# Patient Record
Sex: Male | Born: 1955 | Race: White | Hispanic: No | Marital: Married | Smoking: Never smoker
Health system: Southern US, Community
[De-identification: ages and names within clinical notes are randomized; demographics above are authoritative.]

## PROBLEM LIST (undated history)

## (undated) DIAGNOSIS — G4733 Obstructive sleep apnea (adult) (pediatric): Secondary | ICD-10-CM

## (undated) DIAGNOSIS — F32A Depression, unspecified: Secondary | ICD-10-CM

## (undated) DIAGNOSIS — F988 Other specified behavioral and emotional disorders with onset usually occurring in childhood and adolescence: Secondary | ICD-10-CM

## (undated) DIAGNOSIS — R600 Localized edema: Secondary | ICD-10-CM

## (undated) DIAGNOSIS — R0602 Shortness of breath: Secondary | ICD-10-CM

## (undated) DIAGNOSIS — M129 Arthropathy, unspecified: Secondary | ICD-10-CM

## (undated) DIAGNOSIS — M1990 Unspecified osteoarthritis, unspecified site: Secondary | ICD-10-CM

## (undated) DIAGNOSIS — R002 Palpitations: Secondary | ICD-10-CM

## (undated) DIAGNOSIS — G4730 Sleep apnea, unspecified: Secondary | ICD-10-CM

## (undated) DIAGNOSIS — E785 Hyperlipidemia, unspecified: Secondary | ICD-10-CM

## (undated) DIAGNOSIS — M109 Gout, unspecified: Secondary | ICD-10-CM

## (undated) DIAGNOSIS — I509 Heart failure, unspecified: Secondary | ICD-10-CM

## (undated) DIAGNOSIS — I10 Essential (primary) hypertension: Secondary | ICD-10-CM

## (undated) DIAGNOSIS — E669 Obesity, unspecified: Secondary | ICD-10-CM

## (undated) DIAGNOSIS — R079 Chest pain, unspecified: Secondary | ICD-10-CM

## (undated) DIAGNOSIS — Z96649 Presence of unspecified artificial hip joint: Secondary | ICD-10-CM

## (undated) DIAGNOSIS — M549 Dorsalgia, unspecified: Secondary | ICD-10-CM

## (undated) DIAGNOSIS — M2240 Chondromalacia patellae, unspecified knee: Secondary | ICD-10-CM

## (undated) DIAGNOSIS — M2550 Pain in unspecified joint: Secondary | ICD-10-CM

## (undated) HISTORY — DX: Chondromalacia patellae, unspecified knee: M22.40

## (undated) HISTORY — DX: Obesity, unspecified: E66.9

## (undated) HISTORY — DX: Sleep apnea, unspecified: G47.30

## (undated) HISTORY — DX: Presence of unspecified artificial hip joint: Z96.649

## (undated) HISTORY — DX: Chest pain, unspecified: R07.9

## (undated) HISTORY — DX: Heart failure, unspecified: I50.9

## (undated) HISTORY — DX: Dorsalgia, unspecified: M54.9

## (undated) HISTORY — DX: Localized edema: R60.0

## (undated) HISTORY — DX: Gout, unspecified: M10.9

## (undated) HISTORY — PX: NASAL SINUS SURGERY: SHX719

## (undated) HISTORY — DX: Depression, unspecified: F32.A

## (undated) HISTORY — DX: Other specified behavioral and emotional disorders with onset usually occurring in childhood and adolescence: F98.8

## (undated) HISTORY — DX: Palpitations: R00.2

## (undated) HISTORY — DX: Shortness of breath: R06.02

## (undated) HISTORY — DX: Obstructive sleep apnea (adult) (pediatric): G47.33

## (undated) HISTORY — PX: JOINT REPLACEMENT: SHX530

## (undated) HISTORY — DX: Arthropathy, unspecified: M12.9

## (undated) HISTORY — DX: Pain in unspecified joint: M25.50

## (undated) HISTORY — PX: COLONOSCOPY: SHX174

## (undated) HISTORY — DX: Hyperlipidemia, unspecified: E78.5

---

## 2001-02-19 ENCOUNTER — Emergency Department (HOSPITAL_COMMUNITY): Admission: EM | Admit: 2001-02-19 | Discharge: 2001-02-19 | Payer: Self-pay | Admitting: Emergency Medicine

## 2003-12-17 ENCOUNTER — Ambulatory Visit (HOSPITAL_COMMUNITY): Admission: RE | Admit: 2003-12-17 | Discharge: 2003-12-17 | Payer: Self-pay | Admitting: Gastroenterology

## 2004-04-14 ENCOUNTER — Ambulatory Visit: Payer: Self-pay | Admitting: Family Medicine

## 2004-04-18 ENCOUNTER — Encounter: Admission: RE | Admit: 2004-04-18 | Discharge: 2004-04-18 | Payer: Self-pay | Admitting: Family Medicine

## 2004-05-10 ENCOUNTER — Ambulatory Visit: Payer: Self-pay | Admitting: Family Medicine

## 2004-12-28 ENCOUNTER — Ambulatory Visit (HOSPITAL_COMMUNITY): Admission: RE | Admit: 2004-12-28 | Discharge: 2004-12-28 | Payer: Self-pay

## 2004-12-28 ENCOUNTER — Ambulatory Visit (HOSPITAL_BASED_OUTPATIENT_CLINIC_OR_DEPARTMENT_OTHER): Admission: RE | Admit: 2004-12-28 | Discharge: 2004-12-28 | Payer: Self-pay

## 2005-06-07 ENCOUNTER — Inpatient Hospital Stay (HOSPITAL_COMMUNITY): Admission: RE | Admit: 2005-06-07 | Discharge: 2005-06-11 | Payer: Self-pay | Admitting: Orthopedic Surgery

## 2005-06-21 ENCOUNTER — Emergency Department (HOSPITAL_COMMUNITY): Admission: EM | Admit: 2005-06-21 | Discharge: 2005-06-22 | Payer: Self-pay | Admitting: Emergency Medicine

## 2005-07-18 ENCOUNTER — Ambulatory Visit: Payer: Self-pay | Admitting: Family Medicine

## 2007-02-25 DIAGNOSIS — M109 Gout, unspecified: Secondary | ICD-10-CM | POA: Insufficient documentation

## 2008-02-03 ENCOUNTER — Ambulatory Visit: Payer: Self-pay | Admitting: Family Medicine

## 2008-02-03 DIAGNOSIS — M2240 Chondromalacia patellae, unspecified knee: Secondary | ICD-10-CM | POA: Insufficient documentation

## 2008-02-03 DIAGNOSIS — M129 Arthropathy, unspecified: Secondary | ICD-10-CM | POA: Insufficient documentation

## 2008-02-27 ENCOUNTER — Telehealth: Payer: Self-pay | Admitting: Family Medicine

## 2008-03-12 ENCOUNTER — Inpatient Hospital Stay (HOSPITAL_COMMUNITY): Admission: RE | Admit: 2008-03-12 | Discharge: 2008-03-15 | Payer: Self-pay | Admitting: Orthopedic Surgery

## 2008-10-21 ENCOUNTER — Telehealth: Payer: Self-pay | Admitting: Family Medicine

## 2008-11-04 ENCOUNTER — Ambulatory Visit: Payer: Self-pay | Admitting: Family Medicine

## 2008-11-04 DIAGNOSIS — Z96649 Presence of unspecified artificial hip joint: Secondary | ICD-10-CM

## 2008-11-04 DIAGNOSIS — E669 Obesity, unspecified: Secondary | ICD-10-CM

## 2008-11-11 LAB — CONVERTED CEMR LAB
ALT: 41 units/L (ref 0–53)
AST: 30 units/L (ref 0–37)
Albumin: 4.2 g/dL (ref 3.5–5.2)
Alkaline Phosphatase: 65 units/L (ref 39–117)
BUN: 16 mg/dL (ref 6–23)
Basophils Absolute: 0 10*3/uL (ref 0.0–0.1)
Basophils Relative: 0.4 % (ref 0.0–3.0)
Bilirubin, Direct: 0 mg/dL (ref 0.0–0.3)
CO2: 28 meq/L (ref 19–32)
Calcium: 9.4 mg/dL (ref 8.4–10.5)
Chloride: 114 meq/L — ABNORMAL HIGH (ref 96–112)
Cholesterol: 223 mg/dL — ABNORMAL HIGH (ref 0–200)
Creatinine, Ser: 0.8 mg/dL (ref 0.4–1.5)
Direct LDL: 169.1 mg/dL
Eosinophils Absolute: 0.2 10*3/uL (ref 0.0–0.7)
Eosinophils Relative: 3.3 % (ref 0.0–5.0)
GFR calc non Af Amer: 108.46 mL/min (ref >60)
Glucose, Bld: 90 mg/dL (ref 70–99)
HCT: 41 % (ref 39.0–52.0)
HDL: 49.2 mg/dL (ref >39.00)
Hemoglobin: 14.3 g/dL (ref 13.0–17.0)
Lymphocytes Relative: 21.4 % (ref 12.0–46.0)
Lymphs Abs: 1.5 10*3/uL (ref 0.7–4.0)
MCHC: 35 g/dL (ref 30.0–36.0)
MCV: 84.4 fL (ref 78.0–100.0)
Monocytes Absolute: 0.5 10*3/uL (ref 0.1–1.0)
Monocytes Relative: 7 % (ref 3.0–12.0)
Neutro Abs: 4.8 10*3/uL (ref 1.4–7.7)
Neutrophils Relative %: 67.9 % (ref 43.0–77.0)
PSA: 1.07 ng/mL (ref 0.10–4.00)
Platelets: 233 10*3/uL (ref 150.0–400.0)
Potassium: 4.2 meq/L (ref 3.5–5.1)
RBC: 4.86 M/uL (ref 4.22–5.81)
RDW: 12.9 % (ref 11.5–14.6)
Sodium: 144 meq/L (ref 135–145)
TSH: 1.32 microintl units/mL (ref 0.35–5.50)
Total Bilirubin: 1.1 mg/dL (ref 0.3–1.2)
Total CHOL/HDL Ratio: 5
Total Protein: 7.7 g/dL (ref 6.0–8.3)
Triglycerides: 66 mg/dL (ref 0.0–149.0)
VLDL: 13.2 mg/dL (ref 0.0–40.0)
Vit D, 25-Hydroxy: 26 ng/mL — ABNORMAL LOW (ref 30–89)
WBC: 7 10*3/uL (ref 4.5–10.5)

## 2009-11-03 ENCOUNTER — Ambulatory Visit: Payer: Self-pay | Admitting: Family Medicine

## 2009-11-03 LAB — CONVERTED CEMR LAB: Uric Acid, Serum: 11.4 mg/dL — ABNORMAL HIGH (ref 4.0–7.8)

## 2009-11-23 ENCOUNTER — Encounter: Payer: Self-pay | Admitting: Family Medicine

## 2010-03-07 ENCOUNTER — Telehealth (INDEPENDENT_AMBULATORY_CARE_PROVIDER_SITE_OTHER): Payer: Self-pay | Admitting: *Deleted

## 2010-07-07 ENCOUNTER — Other Ambulatory Visit: Payer: Self-pay

## 2010-07-07 DIAGNOSIS — F439 Reaction to severe stress, unspecified: Secondary | ICD-10-CM

## 2010-07-07 MED ORDER — LORAZEPAM 2 MG PO TABS: 2.0000 mg | ORAL_TABLET | Freq: Three times a day (TID) | ORAL | Status: AC | PRN

## 2010-07-07 NOTE — Medication Information (Signed)
Summary: Prior Authorization Request for Celebrex  Prior Authorization Request for Celebrex   Imported By: Maryln Gottron 11/25/2009 14:28:01  _____________________________________________________________________  External Attachment:    Type:   Image     Comment:   External Document

## 2010-07-07 NOTE — Progress Notes (Signed)
Summary: Naproxen instead of Celebrex?  Phone Note Outgoing Call   Summary of Call: Tried to call patient to inform him that the Celebrex is being denied. Per MD we can try Naproxen 375 two times a day or do a referral to pain management. Left message for patient to return my call. Initial call taken by: Josph Macho RMA,  March 07, 2010 4:09 PM  Follow-up for Phone Call        Patient informed and states he will think about it and give [place] a call back. Follow-up by: Josph Macho RMA,  March 09, 2010 4:28 PM

## 2010-09-15 ENCOUNTER — Other Ambulatory Visit: Payer: Self-pay

## 2010-09-15 MED ORDER — HYDROCODONE-ACETAMINOPHEN 10-650 MG PO TABS: 1.0000 | ORAL_TABLET | Freq: Four times a day (QID) | ORAL | Status: DC | PRN

## 2010-09-15 NOTE — Telephone Encounter (Signed)
rx called in [facility] to [facility]

## 2010-10-18 NOTE — H&P (Signed)
[name], [name] NO.:  [id number]   MEDICAL RECORD NO.:  [id number]          PATIENT TYPE:  INP   LOCATION:  1616                         FACILITY:  [facility]   PHYSICIAN:  Ollen Gross, M.D.    DATE OF BIRTH:  [birth year]   DATE OF ADMISSION:  03/12/2008  DATE OF DISCHARGE:  03/15/2008                              HISTORY & PHYSICAL   Date of office visit, history, and physical were performed on February 18, 2008.   CHIEF COMPLAINT:  Left hip pain.   HISTORY OF PRESENT ILLNESS:  The patient is a 55 year old male, who has  been seen by Dr. Lequita Halt for ongoing left hip pain.  He is known to Dr.  Lequita Halt and had previously undergone a right total hip back in 2007, and  doing well with that.  Now, the left hip continues to be a problem, and  he presents now for surgery.   ALLERGIES:  No known drug allergies.   CURRENT MEDICATIONS:  Glucosamine, Celebrex, multivitamins, baby  aspirin, and oxycodone.   PAST MEDICAL HISTORY:  Gout.   PAST SURGICAL HISTORY:  1. Ear surgery.  2. Abdominal hernia repair.  3. Right total hip.   SOCIAL HISTORY:  He is single.  Denies the use of tobacco products or  alcohol products.  He is a Sports administrator.   FAMILY HISTORY:  Aunt deceased with leukemia, father deceased with MI  and diabetes.   REVIEW OF SYSTEMS:  GENERAL:  No fevers, chills, or night sweats.  NEURO:  No seizures, syncope, or paralysis.  RESPIRATORY:  No shortness  of breath, productive cough, or hemoptysis.  CARDIOVASCULAR:  No chest  pain or orthopnea.  GI:  No nausea, vomiting, diarrhea, or constipation.  GU:  No dysuria, hematuria, or discharge.  MUSCULOSKELETAL:  Left hip.   PHYSICAL EXAMINATION:  VITAL SIGNS:  Pulse 80, respirations 14, blood  pressure 132/80.  GENERAL:  A 55 year old white male, well-nourished, well-developed,  short in stature, slightly overweight, alert, oriented, and cooperative.  HEENT:  Normocephalic, atraumatic.  Pupils  round and reactive.  EOMs  intact.  NECK:  Supple.  CHEST:  Clear.  HEART:  Regular rate and rhythm without murmur.  ABDOMEN:  Soft, slightly __________, and bowel sounds present.  RECTAL, BREASTS, GENITALIA:  Not done and not pertinent to the present  illness.  EXTREMITIES:  Left hip flexion 90, internal rotation is__________, 0  external rotation, 10 to 15 degrees abduction.   IMPRESSION:  Osteoarthritis, left hip.   PLAN:  The patient admitted to [facility] to undergo a left  total hip replacement and arthroplasty.  The surgery will be performed  by Dr. Ollen Gross.      Alexzandrew L. Perkins, P.A.C.      Ollen Gross, M.D.  Electronically Signed    ALP/MEDQ  D:  03/15/2008  T:  03/15/2008  Job:  [id number]

## 2010-10-18 NOTE — Op Note (Signed)
[name], [name] NO.:  [id number]   MEDICAL RECORD NO.:  [id number]          PATIENT TYPE:  INP   LOCATION:  0004                         FACILITY:  [facility]   PHYSICIAN:  Ollen Gross, M.D.    DATE OF BIRTH:  22-Apr-1956   DATE OF PROCEDURE:  03/12/2008  DATE OF DISCHARGE:                               OPERATIVE REPORT   PREOPERATIVE DIAGNOSIS:  Osteoarthritis left hip.   POSTOPERATIVE DIAGNOSIS:  Osteoarthritis left hip.   PROCEDURE:  Left total hip arthroplasty.   SURGEON:  Ollen Gross, M.D.   ASSISTANT:  Alexzandrew L. Perkins, P.A.C.   ANESTHESIA:  General.   DRAINS:  Hemovac times one.   COMPLICATIONS:  None.   CONDITION:  Stable to recovery room.   BRIEF CLINICAL NOTE:  [name] is a 55 year old male with severe end-stage  arthritis of the left hip with progressively worsening pain and  dysfunction.  He is status post successful right total hip arthroplasty  and presents now for left total hip arthroplasty.   PROCEDURE IN DETAIL:  After the successful administration of general  anesthetic the patient was placed in the right lateral decubitus  position with the left side up and held with the hip positioner.  Left  lower extremity was isolated from his perineum with plastic drapes and  prepped and draped in the usual sterile fashion.  Short posterolateral  incision was made with a 10 blade through subcutaneous tissue to the  level of the fascia lata which was incised in line with a skin incision.  Sciatic nerve was palpated and protected and then short external  rotators isolated off the femur.  Capsulectomy is performed and the hip  is dislocated.  The center of the femoral head is marked and the trial  prosthesis placed such that the center of the trial head corresponds to  the center of his native femoral head.  Osteotomy line was marked on the  femoral neck and osteotomy made with an oscillating saw.  Femoral head  was removed and the femur  retracted anteriorly to gain acetabular  exposure.   The acetabular retractors were placed and the labrum and osteophytes  removed.  Acetabular reaming starts at 47 mm coursing increments of 2-55  mm and a 56 mm Pinnacle acetabular shell was impacted in anatomic  position with outstanding purchase.  We did not use any dome screws.  The apex hole eliminator is placed and then the permanent 40 mm neutral  Ultamet metal liner is placed for metal-on-metal hip replacement.   The femur is prepared with the canal finder and irrigation.  Axial  reaming is performed to 15.5 mm.  Proximal reaming is performed to 31F  and the sleeve machined to a large.  The 31F large trial sleeve is  placed with 20 x 15 stem and a 36 plus 12 neck.  His native anteversion  is neutral so I put him in 20 degrees of anteversion.  The hip was  reduced and there is outstanding stability.  There was full extension  and full external rotation, 70 degrees flexion, 40 degrees  adduction, 90  degrees internal rotation and 90 degrees of flexion and 70 degrees of  internal rotation.  By placing the left leg on top of the right it is  felt that the leg lengths are equal.  The hip was dislocated and all  trials were removed.  The permanent 69F large sleeve is placed with a 20  x 15 stem and a 36 plus 12 neck in 20 degrees of anteversion.  The 40  plus 3 head is placed and the hip was reduced with the same stability  parameters.  Please note that the trial was a 40 plus 3 also and that  had the best stability.  Once the hip was reduced and had the same  stability parameters the wounds was copiously irrigated with saline  solution.  Short rotators reattached to the femur through drill holes.  Fascia lata was closed over Hemovac drain with interrupted #1 Vicryl and  subcu closed with #1 and 2-0 Vicryl and subcuticular with running 4-0  Monocryl.  Drain was hooked to suction.  Incision cleaned and dried and  Steri-Strips and a bulky  sterile dressing applied.  He was then placed  into a knee immobilizer, awakened and transported to recovery in stable  condition.      Ollen Gross, M.D.  Electronically Signed     FA/MEDQ  D:  03/12/2008  T:  03/12/2008  Job:  [id number]

## 2010-10-21 NOTE — H&P (Signed)
[name], Henderson NO.:  [id number]   MEDICAL RECORD NO.:  [id number]          PATIENT TYPE:  INP   LOCATION:  NA                           FACILITY:  [facility]   PHYSICIAN:  [name], M.D.    DATE OF BIRTH:  [birth year]   DATE OF ADMISSION:  06/07/2005  DATE OF DISCHARGE:                                HISTORY & PHYSICAL   DATE OF OFFICE VISIT AND HISTORY AND PHYSICAL:  May 25, 2005.   CHIEF COMPLAINT:  Bilateral hip pain, right greater than left.   HISTORY OF PRESENT ILLNESS:  The patient is a 55 year old male being seen by  Dr. Lequita Halt for longstanding history of right greater than left hip pain.  He denies any specific injury but states he played a lot of soccer  throughout his life.  He played college soccer up at [employer] and was able to get through 4 years of playing.  He has had  progressive hip pain.  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  He is seen in the office for x-rays which show severe end-stage  bone-on-bone changes on the right and near bone-on-bone on the left.  It has  progressed to a point where it is interfering with his life, and he is ready  to have something done about it.  Risks and benefits of the surgical  procedure have been discussed, and the patient is subsequently admitted to  the hospital.   ALLERGIES:  No known drug allergies.   CURRENT MEDICATIONS:  Glucosamine, Celebrex, multivitamins, baby aspirin,  oxycodone.   PAST MEDICAL HISTORY:  Negative.   PAST SURGICAL HISTORY:  Ear surgery, abdominal hernia repair.   SOCIAL HISTORY:  Single, denies use of tobacco products and alcohol  products.  He is the Financial controller.   FAMILY HISTORY:  Aunt deceased with leukemia.  Father deceased with history  of MI and diabetes.   REVIEW OF SYSTEMS:  GENERAL:  No fever, chills, night sweats.   NEUROLOGIC:  No seizures or paralysis.  RESPIRATORY: No shortness of breath, productive  cough, hemoptysis. CARDIOVASCULAR: No chest pain, angina, orthopnea. GI: No  nausea, vomiting, diarrhea, constipation. GU: No dysuria, hematuria,  discharge.  MUSCULOSKELETAL: Hips as in History of Present Illness.   PHYSICAL EXAMINATION:  VITAL SIGNS:  Pulse 72, respirations 12, blood  pressure 120/70.  GENERAL:  A 55 year old white male, well-nourished, well-developed, short  stature, slightly overweight, mildly anxious.  He is alert, oriented,  cooperative. Appears to be a good historian.  HEENT:  Normocephalic and atraumatic.  Pupils equal, round, and reactive to  light.  Oropharynx clear.  EOMs intact.  NECK:  Supple.  CHEST:  Somewhat of a barrel chested individual, but he has clear anterior  and posterior chest wall.  HEART:  Regular rate and rhythm, no murmur.  ABDOMEN:  Soft, round, nontender.  Bowel sounds present.  BREASTS/GU/RECTAL:  No done, not pertinent for present illness.  EXTREMITIES:  Right hip shows flexion of 90, zero internal rotation, zero  external rotation.  He has about a 15 degree external rotation contracture  noted.   IMPRESSION:  1.  End-state arthritis right hip.  2.  Near bone-on-bone arthritis left hip.   PLAN:  The patient is admitted to [facility] to undergo right  total hip arthroplasty.  Surgery will be performed by Dr. Ollen Henderson.      [name], P.A.      [name], M.D.  Electronically Signed    ALP/MEDQ  D:  06/06/2005  T:  06/06/2005  Job:  [id number]

## 2010-10-21 NOTE — Discharge Summary (Signed)
NAMERICH, PAPROCKI NO.:  [id number]   MEDICAL RECORD NO.:  [id number]          PATIENT TYPE:  INP   LOCATION:  1503                         FACILITY:  [facility]   PHYSICIAN:  Ollen Gross, M.D.    DATE OF BIRTH:  09-22-1955   DATE OF ADMISSION:  06/07/2005  DATE OF DISCHARGE:  06/11/2005                                 DISCHARGE SUMMARY   ADMISSION DIAGNOSES:  1.  Osteoarthritis right hip.  2.  Near bone-on-bone arthritis left hip.   DISCHARGE DIAGNOSES:  1.  Osteoarthritis right hip, status post right total hip arthroplasty.  2.  Near bone-on-bone arthritis left hip.  3.  Postoperative blood loss anemia.   PROCEDURE:  On June 07, 2005, right total hip arthroplasty. Surgeon was  Dr. Lequita Halt. Assistant, Alexzandrew L. Perkins, P.A.C.   ANESTHESIA:  General.   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 Now  presents for total hip arthroplasty.   CONSULTATIONS:  None.   LABORATORY DATA:  Preoperative CBC hemoglobin 13.6, hematocrit 41.5, white  cell count 9.2. Hemoglobin dropped to 11.7, last noted at 10.2 with a  hematocrit of 29.6. PT and PTT preoperatively at 13.5 to 34 respectively.  INR 1.0. Serial pro times were followed. PT/INR 16.5 and 1.3. Chem panel on  admission showed an elevated AST of 39, elevated ALT of 71. Remaining chem  panel within normal limits. Serial BMETs were followed. Electrolytes  remained within normal limits along with the renal function. Urinalysis  preoperatively was negative. Blood type was AB positive. EKG dated May 25, 2005 had normal sinus rhythm, left ventricular hypertrophy with QRS  widening, nonspecific T-wave abnormalities when compared to EKG of May 25, 2005. Previous EKG no significant change was seen since the last tracing  confirmed by Dr. Armanda Magic.   HOSPITAL COURSE:  The patient was admitted to  [facility].  Tolerated the procedure well. Later, sent to regular rate and rhythm and  then orthopedic floor. Had a rough night following surgery. Was using the  pain pills with no much relief. Had been on oxycodone for some time  preoperatively. Increases PCA to high dose as this was not done through the  night. He did have a little bit of nausea with change of position, given  antiemetics. Medications were increased. Hemovac drain was pulled without  difficulty. By day two, he was doing a little bit better with his pain  control. He slept in the chair through the night. He started having some  foot pain which was believed to be his gout flare up. Dressing was changed.  Incision was healing well. Did have some elevated temperature and encouraged  incentive spirometer. Started on colchicine for potential gout flare up.  Added Celebrex the next day.   By June 10, 2005, the next day, the temperature was up that night but had  come back down to a normal level. On the morning of day three, the gout  flare up was resolving.  Incision was healing well. From the therapy  standpoint by that time, he was already getting up and ambulating 25 feet  and the later walked 90 feet that day. He had been working with PT and OT  and felt that since he was progressing well he would be ready for discharge  on the following day of June 11, 2004. His gout had improved. He was  progressing with physical therapy. Up ambulating approximately 60 feet.  Tolerating his medications and was discharged home.   DISCHARGE INSTRUCTIONS:  1.  Discharged home on June 11, 2005.  2.  Discharge diagnoses, please see above.  3.  Discharge medications: Percocet, Robaxin, Coumadin.  4.  Diet: As tolerated.  5.  Activity: 25-50% partial weightbearing to right lower extremity. Gait      training, ambulation, ADLs, home health PT and home health nursing      through [facility].  6.  Follow up two weeks from  surgery. Contact the office at [phone].   DISPOSITION:  Home.   CONDITION ON DISCHARGE:  Improving.      Alexzandrew L. Julien Girt, P.A.      Ollen Gross, M.D.  Electronically Signed    ALP/MEDQ  D:  07/12/2005  T:  07/13/2005  Job:  [id number]   cc:   Ollen Gross, M.D.  Fax: [phone]   Patient's chart

## 2010-10-21 NOTE — Discharge Summary (Signed)
NAMEDENORRIS, REUST NO.:  [id number]   MEDICAL RECORD NO.:  [id number]          PATIENT TYPE:  INP   LOCATION:  1616                         FACILITY:  [facility]   PHYSICIAN:  Ollen Gross, M.D.    DATE OF BIRTH:  August 30, 1955   DATE OF ADMISSION:  03/12/2008  DATE OF DISCHARGE:  03/15/2008                               DISCHARGE SUMMARY   ADMITTING DIAGNOSIS:  1. Osteoarthritis left hip.  2. Gout.   DISCHARGE DIAGNOSIS:  1. Osteoarthritis left hip, status post left total replacement      arthroplasty.  2. Gout.   PROCEDURE:  March 12, 2008, left total hip.  Surgeon Dr. Lequita Halt,  assistant Avel Peace, PA-C.   CONSULTS:  None.   BRIEF HISTORY:  Rosalia Hammers is a 55 year old male with severe end-stage  arthritis of left hip, progressively worsening pain and dysfunction.  He  is status post successful right total hip, now presents to have the  other side done.   LABORATORY DATA:  Preop CBC showed hemoglobin 13.8, hematocrit of 40.5,  white cell count 7.8 with platelets of 250.  Postop hemoglobin 11.1  drifts down, last known H&H was 10.5 and 30.8.  PT/PTT preop 13.3 and  28, respectively.  INR 1.0.  Serial pro times followed.  Last known  PT/INR 18.3/1.5.   Chem panel on admission all within normal limits.  Serial B-mets were  followed.  Electrolytes remained within normal limits.  Preop UA:  Trace  hemoglobin, 0-2 red cells, otherwise negative.  Blood group type AB  positive.   Electrocardiogram, March 09, 2008:  Normal sinus rhythm, RSR in QR  pattern in V1 suggests right ventricular conduction delay, left axis  deviation, left anterior fascicular block, left ventricular hypertrophy.  No significant change from last tracing of May 25, 2005.  Confirmed  by Dr. Jacinto Halim.   X-RAYS:  Left hip films, March 09, 2008:  Advanced left hip arthritis.  Portable pelvis and hip film on March 12, 2008:  Left total hip without  complicating features.   HOSPITAL  COURSE:  The patient was admitted to [facility],  tolerated procedure well, later transferred from recovery room to the  orthopedic floor, started PCA empirically for pain control following  surgery.  Given 24 hours postop IV antibiotics.  Started on Coumadin per  protocol.  Patient doing pretty well on the morning of day 1.  Hemovac  drain was pulled.  Started getting up out of bed, partial weightbearing.  By day 2, started to progress with therapy, walking about 140 feet.  Weaned over to oral  meds.  The PCA was discontinued after day 1.  Weaned over to oral meds.  Tolerating therapy well, was walking about  150 feet by day 2, was ready to go home by postop day 3.   DISCHARGE/PLAN:  Patient discharged home on March 15, 2008.   DISCHARGE DIAGNOSES:  Please see above.   DISCHARGE MEDICATIONS:  Percocet, Robaxin, Coumadin, Restoril.   DIET:  As tolerated.   FOLLOWUP:  Two weeks.   ACTIVITY:  Partial weightbearing on the left  lower extremity, 25%-50%.  Total hip protocol.  Hip precautions.  May start showering.  Do not  serve submerge incision under water.   DISPOSITION:  Home.   CONDITION ON DISCHARGE:  Improving.      Alexzandrew L. Perkins, P.A.C.      Ollen Gross, M.D.  Electronically Signed    ALP/MEDQ  D:  04/24/2008  T:  04/24/2008  Job:  [id number]   cc:   Ollen Gross, M.D.  Fax: [phone]

## 2010-10-21 NOTE — Op Note (Signed)
NAME:  [name], [name]                    ACCOUNT NO.:  [id number]   MEDICAL RECORD NO.:  [id number]          PATIENT TYPE:  AMB   LOCATION:  DSC                          FACILITY:  [facility]   PHYSICIAN:  Lorre Munroe., M.D.DATE OF BIRTH:  03-06-1956   DATE OF PROCEDURE:  12/28/2004  DATE OF DISCHARGE:                                 OPERATIVE REPORT   PREOPERATIVE DIAGNOSIS:  Umbilical hernia.   POSTOPERATIVE DIAGNOSES:  1.  Umbilical hernia.  2.  Epigastric hernia   OPERATION:  Repair of umbilical and epigastric hernia.   SURGEON:  Zigmund Daniel, M.D.   ANESTHESIA:  General and local.   PROCEDURE:  After the patient was monitored and anesthetized and had routine  preparation and draping of the abdomen, I made a transverse, downwardly-  curved incision about 4 cm in length at just above the umbilicus.  I  separated the umbilical hernia sac and contents from the umbilical skin and  the surrounding normal tissues.  I dissected it down and cut the attachments  to the fascia and reduced it.  It seemed to be sac containing omentum.  I  found that the defect was about 1.5 cm in diameter.  We then dissected  laterally and superiorly and inferiorly to create laxity so that I could  close the defect and patch it with mesh.  In so doing, I found three defects  containing preperitoneal fat in the area cephalad to the umbilical hernia  defect.  I interpreted these as epigastric hernias.  There were all slightly  off the midline.  I dissected each one up and either removed fat which was protruding through  the fascia or reduced it.  I then closed all hernia defects with 2-0 Prolene  suture.  I dissected some more so that there was a space available to  implant an overlay mesh.  I cut a piece of polypropylene mesh approximately  3 x 4 cm and elliptically shaped and sewed that in over all of the repairs  that I had made with running basting 2-0 Prolene suture.  That appeared to  protect all  of the area of attenuated fascia.  Hemostasis was excellent.  I thoroughly anesthetized the deep tissues and the skin.  I used a long-  acting local anesthetic.  I used 3-0 Vicryl then to sew the umbilical skin  down to the central part of the mesh and sewed down the subcutaneous tissues  laterally to reduce available dead space and prevent seroma.  I closed the  skin with running intracuticular 4-0 Vicryl reinforced by Steri-Strips and  applied a bulky protective bandage.  He tolerated the operation well.       WB/MEDQ  D:  12/28/2004  T:  12/28/2004  Job:  [id number]

## 2010-10-21 NOTE — Op Note (Signed)
[name], [name] NO.:  [id number]   MEDICAL RECORD NO.:  [id number]          PATIENT TYPE:  INP   LOCATION:  0003                         FACILITY:  [facility]   PHYSICIAN:  Ollen Gross, M.D.    DATE OF BIRTH:  30-Oct-1955   DATE OF PROCEDURE:  06/07/2005  DATE OF DISCHARGE:                                 OPERATIVE REPORT   PREOPERATIVE DIAGNOSIS:  Osteoarthritis right hip.   POSTOPERATIVE DIAGNOSIS:  Osteoarthritis right hip.   PROCEDURE:  Right total hip arthroplasty.   SURGEON:  Ollen Gross, M.D.   ASSISTANT:  Avel Peace.   ANESTHESIA:  General.   ESTIMATED BLOOD LOSS:  450.   DRAINS:  Hemovac x1.   COMPLICATIONS:  None.   CONDITION:  Stable to recovery.   BRIEF CLINICAL NOTE:  [name] is a 55 year old male with end-stage arthritis in  both hips right more symptomatic than left. He has had intractable pain and  presents now for a total hip arthroplasty.   PROCEDURE IN DETAIL:  After successful administration of general anesthetic,  the patient's placed in the left lateral decubitus position with the right  side up and held with a hip positioner. The right lower extremity was  isolated from his perineum with plastic drapes and prepped and draped in the  usual sterile fashion. A short posterolateral incision was made with a 10  blade through the subcutaneous tissue to the level of the fascia lata which  was incised in line with the skin incision. The sciatic nerve was palpated  and protected and the short rotators isolated off the femur. Capsulectomy  was performed and the hip is dislocated. A trial prosthesis is placed such  that the center of the trial head corresponds to the center of his native  femoral head. Osteotomy line is marked on the femoral neck and osteotomy  made with an oscillating saw. The femoral head is removed and then femur  retracted anteriorly to gain acetabular exposure.   The acetabular reaming is initiated at 47  coursing in increments of 2 up to  55 mm and a 56 mm pinnacle acetabular shell is placed in anatomic position  with excellent fit and then is transfixed with two dome screws with  excellent purchase. A trial 36 mm neutral liner was placed. I then removed  the osteophytes around the rim of the shell.   The femur is repaired with the canal finder and then irrigation. Axial  reaming is performed 15.5 mm, proximal reaming to a 67F and the sleeve  machined to a large. A 67F large trial sleeve is placed with a 20 x 15 stem  and a 36 plus 8 neck. With the 36 plus 8 neck, the offset was not restored  as well as I would have liked, so we went to a 36 plus 12 neck. I put in  about 20 degrees of anteversion which was slightly beyond his native  anteversion. With a 36 plus 0 head, there was still a tiny bit of laxity so  I went to a 36 plus 6. This had excellent  soft tissue tension. There was  great stability with full extension, full external rotation, 70 degrees  flexion, 40 degrees adduction and 90 degrees of internal rotation and 90  degrees of flexion, 70 degrees internal rotation. By placing the right leg  on top of the left, it felt as though the leg lengths were equal. The hip is  then dislocated and all trials are removed. The permanent apex hole  eliminator is placed into the acetabular shell and then the permanent 36 mm  neutral Ultamet metal liner was placed. This is a metal-on-metal hip  replacement. The permanent 22F large sleeve is placed with the 20 x 15 stem,  36 plus 12 neck and again about 10 degrees beyond his native anteversion. A  36 plus 6 head is placed and the hip is reduced with the same stability  parameters. The wound was copiously irrigated with saline solution and the  short rotators reattached to the femur through drill holes. The fascia lata  was closed over a Hemovac drain with interrupted #1 Vicryl, subcu closed  with #1 and #2-0 Vicryl and subcuticular with running  4-0 Monocryl. The  incision is clean and dried and Steri-Strips and bulky sterile dressing  applied. He was then awakened and transported to recovery in stable  condition.      Ollen Gross, M.D.  Electronically Signed     FA/MEDQ  D:  06/07/2005  T:  06/07/2005  Job:  [id number]

## 2010-11-12 ENCOUNTER — Other Ambulatory Visit: Payer: Self-pay | Admitting: Family Medicine

## 2010-11-13 NOTE — Telephone Encounter (Signed)
Prescriptions refilled patient come in for physical

## 2011-01-31 ENCOUNTER — Other Ambulatory Visit: Payer: Self-pay

## 2011-01-31 MED ORDER — LORAZEPAM 2 MG PO TABS: ORAL_TABLET | ORAL | Status: DC

## 2011-01-31 NOTE — Telephone Encounter (Signed)
Ok per Dr. Scotty Court to call in lorazepam 2 mg 90 x 5rf.

## 2011-03-06 LAB — COMPREHENSIVE METABOLIC PANEL
ALT: 42
AST: 30
Albumin: 4
Alkaline Phosphatase: 71
BUN: 15
CO2: 27
Calcium: 9.8
Chloride: 106
Creatinine, Ser: 0.94
GFR calc Af Amer: >60
GFR calc non Af Amer: >60
Glucose, Bld: 96
Potassium: 4.1
Sodium: 140
Total Bilirubin: 0.8
Total Protein: 7

## 2011-03-06 LAB — URINALYSIS, ROUTINE W REFLEX MICROSCOPIC
Bilirubin Urine: NEGATIVE
Glucose, UA: NEGATIVE
Ketones, ur: NEGATIVE
Leukocytes, UA: NEGATIVE
Nitrite: NEGATIVE
Protein, ur: NEGATIVE
Specific Gravity, Urine: 1.018
Urobilinogen, UA: 0.2
pH: 6

## 2011-03-06 LAB — CBC
HCT: 32.6 — ABNORMAL LOW
HCT: 40.5
Hemoglobin: 10.5 — ABNORMAL LOW
Hemoglobin: 13.8
MCHC: 34.1
MCHC: 34.2
MCV: 87.1
Platelets: 186
Platelets: 192
Platelets: 250
RBC: 3.72 — ABNORMAL LOW
RBC: 4.65
RDW: 13.2
RDW: 13.2
RDW: 13.5
RDW: 13.5
WBC: 7.8
WBC: 8

## 2011-03-06 LAB — BASIC METABOLIC PANEL
BUN: 9
CO2: 29
CO2: 30
Calcium: 8.5
Calcium: 8.5
Chloride: 101
Creatinine, Ser: 0.74
Creatinine, Ser: 0.75
GFR calc Af Amer: >60
GFR calc Af Amer: >60
GFR calc non Af Amer: >60
GFR calc non Af Amer: >60
Glucose, Bld: 104 — ABNORMAL HIGH
Potassium: 4.1
Sodium: 137

## 2011-03-06 LAB — PROTIME-INR
INR: 1
INR: 1.4
INR: 1.5
Prothrombin Time: 13.3
Prothrombin Time: 15.6 — ABNORMAL HIGH
Prothrombin Time: 17.3 — ABNORMAL HIGH

## 2011-03-06 LAB — URINE MICROSCOPIC-ADD ON

## 2011-03-06 LAB — TYPE AND SCREEN
ABO/RH(D): AB POS
Antibody Screen: NEGATIVE

## 2011-03-06 LAB — APTT: aPTT: 28

## 2011-04-10 ENCOUNTER — Other Ambulatory Visit: Payer: Self-pay

## 2011-04-10 NOTE — Telephone Encounter (Signed)
Please call patient he was given a prescription for hundred tabs and 5 refills.  In April were concerned that he is requiring so much pain medication.  Please call and find out exactly what the problem is and do we need to have him seen by a specialist for further evaluation and find out what the problem is

## 2011-04-10 NOTE — Telephone Encounter (Signed)
rx request for hydrocodone acetamophen  10-650; pt last seen 11/03/09. Pls advise

## 2011-04-12 NOTE — Telephone Encounter (Signed)
Called and spoke with pt and he stated he had to hip replacements and at times he is in a lot of pain at times. Pt states he takes hydrocodone for gout as well. Pls advise.

## 2011-04-13 MED ORDER — HYDROCODONE-ACETAMINOPHEN 10-650 MG PO TABS: 1.0000 | ORAL_TABLET | Freq: Three times a day (TID) | ORAL | Status: DC | PRN

## 2011-04-13 NOTE — Telephone Encounter (Signed)
Vicodin ES number [phone], directions one p.o. T.i.d. P.r.n. Severe pain, no refills.  Instructed him to call his orthopedist, for an appointment to get evaluated to find out why he is having chronic pain.  If, indeed it is chronic pain that the orthopedist cannot solve, then he needs to be seen at the pain clinic

## 2011-04-13 NOTE — Telephone Encounter (Signed)
Pt is aware and rx has been called in to pharmacy.

## 2011-09-19 ENCOUNTER — Other Ambulatory Visit: Payer: Self-pay

## 2011-09-28 ENCOUNTER — Encounter: Payer: Self-pay | Admitting: Family Medicine

## 2011-10-25 ENCOUNTER — Encounter: Payer: Self-pay | Admitting: Family Medicine

## 2012-01-30 ENCOUNTER — Ambulatory Visit: Payer: Self-pay | Admitting: Cardiology

## 2012-01-31 ENCOUNTER — Other Ambulatory Visit: Payer: Self-pay | Admitting: Family Medicine

## 2012-02-07 ENCOUNTER — Encounter: Payer: Self-pay | Admitting: Cardiovascular Disease

## 2012-02-07 ENCOUNTER — Encounter: Payer: Self-pay | Admitting: *Deleted

## 2012-02-08 ENCOUNTER — Institutional Professional Consult (permissible substitution): Payer: Self-pay | Admitting: Cardiovascular Disease

## 2012-03-01 ENCOUNTER — Telehealth: Payer: Self-pay | Admitting: *Deleted

## 2012-03-01 NOTE — Telephone Encounter (Signed)
LEFT MESSAGE WITH BROTHER FOR PT TO CALL BACK PER DR NISHAN NEEDS  [facility] APPT  MAY DOUBLE  BOOK IF NEEDED  C/O CHEST PAIN ?? PT OUT OF TOWN TODAY ./CY

## 2012-03-04 NOTE — Telephone Encounter (Signed)
PT TO CALL BACK TOM TO SCHEDULE  APPT  WITH DR NISHAN./CY

## 2012-06-01 ENCOUNTER — Other Ambulatory Visit: Payer: Self-pay | Admitting: Family Medicine

## 2012-08-06 ENCOUNTER — Other Ambulatory Visit: Payer: Self-pay | Admitting: Internal Medicine

## 2012-08-06 ENCOUNTER — Other Ambulatory Visit: Payer: Self-pay | Admitting: Family Medicine

## 2012-08-06 ENCOUNTER — Ambulatory Visit
Admission: RE | Admit: 2012-08-06 | Discharge: 2012-08-06 | Disposition: A | Discharge status: Home / Self Care | Payer: BC Managed Care – PPO | Source: Ambulatory Visit | Attending: Internal Medicine | Admitting: Internal Medicine

## 2012-08-06 DIAGNOSIS — R0989 Other specified symptoms and signs involving the circulatory and respiratory systems: Secondary | ICD-10-CM

## 2012-08-27 ENCOUNTER — Encounter: Payer: Self-pay | Admitting: *Deleted

## 2012-08-28 ENCOUNTER — Telehealth: Payer: Self-pay | Admitting: *Deleted

## 2012-08-28 ENCOUNTER — Encounter: Payer: Self-pay | Admitting: *Deleted

## 2012-08-28 NOTE — Progress Notes (Signed)
This encounter was created in error - please disregard. This encounter was created in error - please disregard. This encounter was created in error - please disregard. 

## 2012-08-28 NOTE — Telephone Encounter (Signed)
Per verbal from Darrol Angel, NP:  Patient needs to go back to see PCP for this medication. -sh  LM on pt mobile voicemail with these instructions, let him know Dr. Vickey Huger will be available after April 1st for follow up in clinic if needed. -sh

## 2012-08-28 NOTE — Telephone Encounter (Addendum)
error 

## 2012-08-28 NOTE — Telephone Encounter (Signed)
Pt came with smart card from CPAP machine to download.  Pt currently desensitizing with lab owned auto CPAP set between 6 cm and 11 cm with EPR of 2.  Download shows improving usage though average usage around 2 hrs, discussed goal of 4 hrs or more.  Pt reports difficulty with sinus issues which impedes his ability to continue CPAP use beyond a couple of hours.  He notes when he lies down it starts with "lots of liquid in my head", sounds like sinus drainage and then some congestion which makes it difficult for him to use the nasal mask which he likes and is doing well with.  He reports he reaches a point he has to take it off because of these issues.  Gave him a [employer] Full Face Mask to keep today and urged him to try to use this once he got to that point hoping that perhaps this would allow him to benefit from therapy for a longer period of time.  He says he will try it, he doesn't want to have to use it and I don't want that for him either, but it might get him through these issues.  He asks about Steroid nasal spray such as Fluticasone to use at bedtime.  He is using saline nasal spray recommended by PCP.  Pt states PCP wants him to try the saline before trying a prescription but pt feels it isn't helping enough and he wants to sleep better.  We discussed the potential benefit of a referral to an ENT provider to evaluate sinus congestion and drainage issue that is plaguing him at night.  He will consider this but would like to try a steroid nasal spray first.  I will forward request to Darrol Angel, NP as Dr. Vickey Huger is out of town until 09/03/2012. -S. Alanny Rivers, RPSGT

## 2012-08-28 NOTE — Progress Notes (Signed)
Please have patients PCP take care of this

## 2012-09-09 ENCOUNTER — Telehealth: Payer: Self-pay | Admitting: Neurology

## 2012-09-09 DIAGNOSIS — J309 Allergic rhinitis, unspecified: Secondary | ICD-10-CM

## 2012-09-09 DIAGNOSIS — G4733 Obstructive sleep apnea (adult) (pediatric): Secondary | ICD-10-CM

## 2012-09-09 MED ORDER — TRIAMCINOLONE ACETONIDE(NASAL) 55 MCG/ACT NA INHA: 2.0000 | Freq: Every day | NASAL | Status: DC

## 2012-09-09 NOTE — Telephone Encounter (Signed)
Saw [name] at lunch, he is wondering about a trial of nasal spray steroid. He is experiencing a lot of drainage and congestion when he lies down to fall asleep with CPAP. He called while you were out of town, referred pt to his PCP at that time. He explains PCP wants him to use saline but pt feels like its not enough. Told pt he may want to consider consulting with an ENT because it sounds like the problem is severe. He asked if you might let him try the steroid nasal spray and I told him I would ask you and get back to him. -sh      Dear Dirk Dress , I will fill nasocort for him . Prescription attached.

## 2012-09-20 ENCOUNTER — Telehealth: Payer: Self-pay | Admitting: *Deleted

## 2012-09-20 NOTE — Telephone Encounter (Signed)
Spoke to [employer], He just got his nasal spray yesterday.  He also visited his PCP recently and asked to be referred to ENT - his sinus issue and drainage has been going on for about a year and it is impeding his success with CPAP.  He is using the CPAP but can't do so for very long.  He will start using again for 14 days and then return the card for download.  He is very interested in getting his own machine.  I explained we need to help him be able to use it at least 4 hrs a night and 6 nights per week first so he can keep it beyond 90 days.  He understands.

## 2012-09-20 NOTE — Telephone Encounter (Signed)
Message copied by Daryll Drown on Fri Sep 20, 2012  4:35 PM ------      Message from: [facility], [place]      Created: Wed Sep 11, 2012  4:47 PM       Wrote for nasal spray.  2 days ago-       ----- Message -----         From: Bonita Quin, RPSGT         Sent: 09/09/2012   7:33 AM           To: Melvyn Novas, MD            Saw Ray Hally at lunch, he is wondering about a trial of nasal spray steroid.  He is experiencing a lot of drainage and congestion when he lies down to fall asleep with CPAP.  He called while you were out of town, referred pt to his PCP at that time.  He explains PCP wants him to use saline but pt feels like its not enough.  Told pt he may want to consider consulting with an ENT because it sounds like the problem is severe.  He asked if you might let him try the steroid nasal spray and I told him I would ask you and get back to him. -sh       ------

## 2013-04-25 ENCOUNTER — Telehealth: Payer: Self-pay | Admitting: Neurology

## 2013-05-08 ENCOUNTER — Ambulatory Visit (INDEPENDENT_AMBULATORY_CARE_PROVIDER_SITE_OTHER): Payer: BC Managed Care – PPO | Admitting: Neurology

## 2013-05-08 ENCOUNTER — Encounter: Payer: Self-pay | Admitting: Neurology

## 2013-05-08 ENCOUNTER — Encounter (INDEPENDENT_AMBULATORY_CARE_PROVIDER_SITE_OTHER): Payer: Self-pay

## 2013-05-08 VITALS — BP 160/94 | HR 80 | Resp 16 | Ht 66.0 in | Wt 253.0 lb

## 2013-05-08 DIAGNOSIS — G4733 Obstructive sleep apnea (adult) (pediatric): Secondary | ICD-10-CM | POA: Insufficient documentation

## 2013-05-08 NOTE — Patient Instructions (Signed)
CPAP and BIPAP Information CPAP and BIPAP are methods of helping you breathe with the use of air pressure. CPAP stands for "continuous positive airway pressure." BIPAP stands for "bi-level positive airway pressure." In both methods, air is blown into your air passages to help keep you breathing well. With CPAP, the amount of pressure stays the same while you breathe in and out. CPAP is most commonly used for obstructive sleep apnea. For obstructive sleep apnea, CPAP works by holding your airways open so that they do not collapse when your muscles relax during sleep. BIPAP is similar to CPAP except the amount of pressure is increased when you inhale. This helps you take larger breaths. Your health care provider will recommend whether CPAP or BIPAP would be more helpful for you.  WHY ARE CPAP AND BIPAP TREATMENTS USED? CPAP or BIPAP can be helpful if you have:   Sleep apnea.   Chronic obstructive pulmonary disease (COPD).   Diseases that weaken the muscles of the chest, including muscular dystrophy or neurological diseases such as amyotrophic lateral sclerosis (ALS).   Other problems that cause breathing to be weak, abnormal, or difficult.  HOW IS CPAP OR BIPAP ADMINISTERED? Both CPAP and BIPAP are provided by a small machine with a flexible plastic tube that attaches to a plastic mask. The mask fits on your face, and air is blown into your air passages through your nose or mouth. The amount of pressure that is used to blow the air into your air passages can be set on the machine. Your health care provider will determine the pressure setting that should be used based on your individual needs.  WHEN SHOULD CPAP OR BIPAP BE USED? In most cases, the mask is worn only when sleeping. Generally, you will need to wear the mask throughout the night and during the daytime if you take a nap. In a few cases involving certain medical conditions, people also need to wear the mask at other times when they are  awake. Follow your health care provider's instructions for when to use the machine.  USING THE MASK  Because the mask needs to be snug, some people feel a trapped or closed-in feeling (claustrophobic) when first using the mask. You may need to get used to the mask gradually. To do this, you can first hold the mask loosely over your nose or mouth. Gradually apply the mask more snugly. You can also gradually increase the amount of time that you use the mask.   Masks are available in various types and sizes. Some fit over your mouth and nose, and some fit over just your nose. If your mask does not fit well, talk to your health care provider about getting a different one.  If you are using a nasal mask and you tend to breathe through your mouth, a chin strap may be applied to help keep your mouth closed.   The CPAP and BIPAP machines have alarms that may sound if the mask comes off or develops a leak.   If you have trouble with the mask, it is very important that you talk to your health care provider about finding a way to make the mask easier to tolerate. Do not stop using the mask. This could have a negative impact on your health. TIPS FOR USING THE MACHINE  Place your CPAP or BIPAP machine on a secure table or stand near an electrical outlet.   Know where the on-off switch is located on the machine.     Follow your health care provider's instructions for how to set the pressure on your machine and when you should use it.   Do not eat or drink while the CPAP or BIPAP machine is on. Food or fluids could get pushed into your lungs by the pressure of the CPAP or BIPAP.  Do not smoke. Tobacco smoke residue can damage the machine.   For home use, CPAP and BIPAP machines can be rented or purchased through home health care companies. Many different brands of machines are available. Renting a machine before purchasing may help you find out which particular machine works well for you. SEEK  IMMEDIATE MEDICAL CARE IF:  You have redness or open areas around your nose or mouth where the mask fits.   You have trouble operating the CPAP or BIPAP machine.   You cannot tolerate wearing the CPAP or BIPAP mask.  Document Released: 02/18/2004 Document Revised: 01/22/2013 Document Reviewed: 12/19/2012 [facility] Patient Information 2014 [place], [place]. Sleep Apnea Sleep apnea is disorder that affects a person's sleep. A person with sleep apnea has abnormal pauses in their breathing when they sleep. It is hard for them to get a good sleep. This makes a person tired during the day. It also can lead to other physical problems. There are three types of sleep apnea. One type is when breathing stops for a short time because your airway is blocked (obstructive sleep apnea). Another type is when the brain sometimes fails to give the normal signal to breathe to the muscles that control your breathing (central sleep apnea). The third type is a combination of the other two types. HOME CARE  Do not sleep on your back. Try to sleep on your side.  Take all medicine as told by your doctor.  Avoid alcohol, calming medicines (sedatives), and depressant drugs.  Try to lose weight if you are overweight. Talk to your doctor about a healthy weight goal. Your doctor may have you use a device that helps to open your airway. It can help you get the air that you need. It is called a positive airway pressure (PAP) device. There are three types of PAP devices:  Continuous positive airway pressure (CPAP) device.  Nasal expiratory positive airway pressure (EPAP) device.  Bilevel positive airway pressure (BPAP) device. MAKE SURE YOU:  Understand these instructions.  Will watch your condition.  Will get help right away if you are not doing well or get worse. Document Released: 02/29/2008 Document Revised: 05/08/2012 Document Reviewed: 09/23/2011 [facility] Patient Information 2014 [place], [place].

## 2013-05-08 NOTE — Progress Notes (Signed)
[facility]  Provider:  Melvyn Novas, M D  Referring Provider: No ref. provider found Primary Care Physician:  Dr.  Vianne Bulls.  Chief Complaint  Patient presents with  . Breathing Issues    Deviated Septum    HPI:  [name] is a 57 y.o. male  Is seen here as a  revisit  from Dr. Ezzard Standing after a recent nasal -septal surgery.  [name]  is a right-handed, 57 year old gentleman of [name] descent, who originally presented to [facility] with a complaint of progressively louder snoring that had failed to be controlled by a mouth guard appliance .  He had this device made for him by his dentist. The patient attributed his progressive snoring over the last 3 years to having been physically less active. He gained weight especially after 2 hip surgeries were performed. 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  He had 2 hip replacements  and he gained weight,  begun to snore and felt increasingly un-restored in the morning. He awoke with a dry mouth, phlegm obstructed.  his girlfriend  has witnessed apneas as well,  but he had only reluctantly reported those . He also endorsed nocturia 2-3 times at night and waking up every 2 hours.  He reduced his Diet Coke intake, and still woke up every 2 hours,  tried to compensate his EDS  by taking daytime naps of up to 30 minutes.  His particular work environment  (the patient works as a Copywriter, advertising) makes regular sleep habits difficult , he goes  the bed after the kitchen closes , after  Midnight- stays  in bed until about 7:30 AM,  but he is off already awake by 6 AM,   allowing for an overall nocturnal sleep time an average of 5-6 hours.  He is also noted headaches in the morning but these get better as the day goes by. He has not been awoken by severe headaches however. The above history let to a polysomnography evaluation on 07-30-2012.   At the time the patient and [name] Epworth score at 16 points  and the backs inventory at 12 points is a BMI of 39.1 and a neck circumference of 18.5 inches. Meanwhile the also learned that he had a left bundle branch block. The study revealed an AHI of 88.8 but was not REM accentuated and supine versus nonsupine sleep position seemed not to play a role. The patient suffered severe hypoxemia at night- his desaturation reached a nadir of 63% with overall time of 140s 6.8 minutes at or below 89% oxygen saturation. Periodic limb movements were not noted,  neither was a tachybradycardia arrhythmias seen.  The severity of the complex and severe sleep apnea qualified for a split night protocol.  The patient found himself unable to tolerate CPAP mask , yet any nasal airflow at all.  For this reason he was referred for an ENT evaluation. He was told by his ENT physician that he likely only gained about 20% more patent after nasal septal surgery however he feels already that it has improved his overall breathing and he feels now recovered enough to make an attempt to use CPAP again.   Review of Systems: Out of a complete 14 system review, the patient complains of only the following symptoms, and all other reviewed systems are negative.  The patient endorsed in the past and at the sleepiness score of 18 points, today at 11 point he stated that he  had been several days off after the surgery. He still has nocturia, dry mouth in the morning and neck / hypnic headaches.  History   Social History  . Marital Status: Single    Spouse Name: N/A    Number of Children: N/A  . Years of Education: N/A   Occupational History  . Not on file.   Social History Main Topics  . Smoking status: Never Smoker   . Smokeless tobacco: Not on file  . Alcohol Use: No  . Drug Use: Not on file  . Sexual Activity: Not on file   Other Topics Concern  . Not on file   Social History Narrative  . No narrative on file    Family History  Problem Relation Age of Onset  . Ataxia Neg Hx    . Chorea Neg Hx   . Dementia Neg Hx   . Mental retardation Neg Hx   . Migraines Neg Hx   . Multiple sclerosis Neg Hx   . Neurofibromatosis Neg Hx   . Neuropathy Neg Hx   . Parkinsonism Neg Hx   . Seizures Neg Hx   . Stroke Neg Hx     Past Medical History  Diagnosis Date  . GOUT   . EXOGENOUS OBESITY   . ARTHRITIS   . CHONDROMALACIA PATELLA, LEFT   . HIP REPLACEMENT, BILATERAL, HX OF   . SOB (shortness of breath)   . Obstructive apnea     patient could not tolerate CPAP due to nasal airflow restriction.     Past Surgical History  Procedure Laterality Date  . Nasal sinus surgery      Dr . Haroldine Laws , October 2014 , nasal septum     Current Outpatient Prescriptions  Medication Sig Dispense Refill  . allopurinol (ZYLOPRIM) 300 MG tablet TAKE 1 TABLET BY MOUTH EVERY MORNING TO PREVENT GOUT  30 tablet  11  . HYDROcodone-acetaminophen (LORCET) 10-650 MG per tablet Take 1 tablet by mouth every 8 (eight) hours as needed. Maximum 4 tabs in 24 hours  50 tablet  0  . indomethacin (INDOCIN) 25 MG capsule TAKE 1 BY MOUTH THREE TIMES A DAY  90 capsule  11  . LORazepam (ATIVAN) 2 MG tablet Take 1 tablet by mouth 3 times daily as needed for stress or at bedtime.  90 tablet  5  . triamcinolone (NASACORT AQ) 55 MCG/ACT nasal inhaler Place 2 sprays into the nose daily.  1 Inhaler  12   No current facility-administered medications for this visit.    Allergies as of 05/08/2013  . (No Known Allergies)    Vitals: BP 160/94  Pulse 80  Resp 16  Ht 5\' 6"  (1.676 m)  Wt 253 lb (114.76 kg)  BMI 40.85 kg/m2 Last Weight:  Wt Readings from Last 1 Encounters:  05/08/13 253 lb (114.76 kg)   Last Height:   Ht Readings from Last 1 Encounters:  05/08/13 5\' 6"  (1.676 m)    Physical exam:  General: The patient is awake, alert and appears not in acute distress. The patient is well groomed. Head: Normocephalic, atraumatic. Neck is supple. Mallampati 4, neck circumference: 18.25 inches ,   Cardiovascular:  Regular rate and rhythm , without  murmurs or carotid bruit, and without distended neck veins. No TMJ clicking , nasal passage on the left open, and still restricted on the right.  Respiratory: Lungs are clear to auscultation. Skin:  Without evidence of edema, or rash Trunk: BMI is elevated, the patient lost  20 pounds.-and patient  has normal posture.  Neurologic exam : The patient is awake and alert, oriented to place and time.  Memory subjective  described as intact.  There is a normal attention span & concentration ability. Speech is fluent without  dysarthria, dysphonia or aphasia.  Mood and affect are appropriate.  Cranial nerves: Pupils are equal and briskly reactive to light. Funduscopic exam without  evidence of pallor or edema.  Extraocular movements  in vertical and horizontal planes intact and without nystagmus. Visual fields by finger perimetry are intact. Hearing to finger rub intact- bone conduction stronger left than right.  Facial sensation intact to fine touch. Facial motor strength is symmetric and tongue and uvula move midline.  Motor exam:    Normal tone and normal muscle bulk and symmetric normal strength in all extremities.  Sensory:  Fine touch, pinprick and vibration were tested in all extremities.  Proprioception is tested in the upper extremities only. This was  normal.  Coordination: Rapid alternating movements in the fingers/hands is tested and without evidence of ataxia, dysmetria or tremor.  Gait and station: Patient walks without assistive device - Strength within normal limits. Stance is stable and normal.  Deep tendon reflexes: in the  upper and lower extremities are symmetric and intact. Babinski maneuver downgoing.     Assessment/ Plan   After physical and neurologic examination, review of laboratory studies, imaging, neurophysiology testing and pre-existing records, assessment is   1) severe OSA , with resulting high EDS and severe   fatigue, collaborated by his girlfriend's account of witnessed snoring .  positive PSG for mixed apneas at AHI 88. See above  2) obesity , patient was able to lose some weight. Continue low carb diet . He drinks green tea now , not longer soda. Fish and chicken  - diet restricted by gout.  3) nasal septal deviation, now partially corrected and healed since surgery.   Saline nose spray and steroids can be used for the initial autotitration period.  4)Mallompotti still 4. 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  6) HTN -  Nocturia and EDS, fatigue. hopefully responding to CPAP. comorbidities discussed, and risk factors for OSA and OSA as a risk factor.    Plan:  Treatment plan and additional workup :  Autotitration with a low pressure and long Ramp time. Humidified air, provide 2 masks for choice. Referral to [facility].

## 2013-05-27 ENCOUNTER — Telehealth: Payer: Self-pay | Admitting: Neurology

## 2013-05-27 NOTE — Telephone Encounter (Signed)
Left message for patient to call and reschedule 08/14/13 appointment per Dr. Oliva Bustard schedule.

## 2013-05-28 ENCOUNTER — Encounter: Payer: Self-pay | Admitting: Neurology

## 2013-06-02 ENCOUNTER — Encounter: Payer: Self-pay | Admitting: Neurology

## 2013-06-20 NOTE — Telephone Encounter (Signed)
ERROR

## 2013-08-14 ENCOUNTER — Ambulatory Visit: Payer: BC Managed Care – PPO | Admitting: Neurology

## 2013-09-25 ENCOUNTER — Encounter: Payer: Self-pay | Admitting: Cardiovascular Disease

## 2013-09-25 ENCOUNTER — Ambulatory Visit (INDEPENDENT_AMBULATORY_CARE_PROVIDER_SITE_OTHER): Payer: 59 | Admitting: Cardiovascular Disease

## 2013-09-25 ENCOUNTER — Telehealth: Payer: Self-pay | Admitting: *Deleted

## 2013-09-25 VITALS — BP 140/88 | HR 72 | Ht 66.0 in | Wt 250.0 lb

## 2013-09-25 DIAGNOSIS — R0609 Other forms of dyspnea: Secondary | ICD-10-CM

## 2013-09-25 DIAGNOSIS — I10 Essential (primary) hypertension: Secondary | ICD-10-CM | POA: Insufficient documentation

## 2013-09-25 DIAGNOSIS — G4733 Obstructive sleep apnea (adult) (pediatric): Secondary | ICD-10-CM

## 2013-09-25 DIAGNOSIS — E669 Obesity, unspecified: Secondary | ICD-10-CM

## 2013-09-25 DIAGNOSIS — I447 Left bundle-branch block, unspecified: Secondary | ICD-10-CM | POA: Insufficient documentation

## 2013-09-25 DIAGNOSIS — R0989 Other specified symptoms and signs involving the circulatory and respiratory systems: Secondary | ICD-10-CM

## 2013-09-25 MED ORDER — ROSUVASTATIN CALCIUM 20 MG PO TABS: 20.0000 mg | ORAL_TABLET | Freq: Every day | ORAL | Status: DC

## 2013-09-25 NOTE — Progress Notes (Signed)
Patient ID: [name], male   DOB: 07/05/1955, 58 y.o.   MRN: [id number]  58 yo referred for dyspnea LBBB and HTN.  Has been followed by Dr [name] as recently as a year ago  Runs [employer].  Significantly overweight.  Has sleep apnea but not compliant with CPAP.  Complains of dyspnea and fatigue with exertion and sex.  Exercise limited by ortho issues especially left hip replacement.  He indicates normal chemical myovue and echo a year ago with Dr [name].  Compliant with meds.  Poor diet No chest pain Mild reactive airway disease and chronic congestion in throat.  LBBB thought to be due to HTN No syncope or high grade AV block.      ROS: Denies fever, malais, weight loss, blurry vision, decreased visual acuity, cough, sputum, SOB, hemoptysis, pleuritic pain, palpitaitons, heartburn, abdominal pain, melena, lower extremity edema, claudication, or rash.  All other systems reviewed and negative   General: Affect appropriate Obese [name] male  HEENT: normal Neck supple with no adenopathy JVP normal no bruits no thyromegaly Lungs clear with no wheezing and good diaphragmatic motion Heart:  S1/S2 no murmur,rub, gallop or click PMI normal Abdomen: benighn, BS positve, no tenderness, no AAA no bruit.  No HSM or HJR Distal pulses intact with no bruits No edema Neuro non-focal Skin warm and dry No muscular weakness  Medications Current Outpatient Prescriptions  Medication Sig Dispense Refill  . carvedilol (COREG) 6.25 MG tablet Take 6.25 mg by mouth 2 (two) times daily with a meal.      . dutasteride (AVODART) 0.5 MG capsule Take 0.5 mg by mouth daily.      . indomethacin (INDOCIN) 50 MG capsule Take 50 mg by mouth 2 (two) times daily with a meal.      . olmesartan-hydrochlorothiazide (BENICAR HCT) 40-25 MG per tablet Take 1 tablet by mouth daily.      . rosuvastatin (CRESTOR) 20 MG tablet Take 20 mg by mouth daily.      . tamsulosin (FLOMAX) 0.4 MG CAPS capsule Take 0.4 mg by mouth  daily.      . rosuvastatin (CRESTOR) 20 MG tablet Take 1 tablet (20 mg total) by mouth daily.  90 tablet  3   No current facility-administered medications for this visit.    Allergies Review of patient's allergies indicates no known allergies.  Family History: Family History  Problem Relation Age of Onset  . Ataxia Neg Hx   . Chorea Neg Hx   . Dementia Neg Hx   . Mental retardation Neg Hx   . Migraines Neg Hx   . Multiple sclerosis Neg Hx   . Neurofibromatosis Neg Hx   . Neuropathy Neg Hx   . Parkinsonism Neg Hx   . Seizures Neg Hx   . Stroke Neg Hx     Social History: History   Social History  . Marital Status: Single    Spouse Name: N/A    Number of Children: N/A  . Years of Education: N/A   Occupational History  . Not on file.   Social History Main Topics  . Smoking status: Never Smoker   . Smokeless tobacco: Not on file  . Alcohol Use: No  . Drug Use: Not on file  . Sexual Activity: Not on file   Other Topics Concern  . Not on file   Social History Narrative  . No narrative on file    Electrocardiogram:  NSR LBBB rate 72   Assessment and  Plan

## 2013-09-25 NOTE — Telephone Encounter (Signed)
Patient calls in today, he went to the cardiologist today and he has gained weight.  Cardiologist told him to get on CPAP therapy.  He is motivated and ready to get this done.  He has had extensive desensitization in the past, also saw ENT physician and had surgery.  He is ready for a machine of his own and is concerned about breathing through his mouth.  I reassured him that now that his nose is not contributing to difficulties using CPAP, he will probably do very well.  Kissa contacted [facility] and they will move forward with his order.  He is anxious to get started tomorrow if possible.

## 2013-09-25 NOTE — Patient Instructions (Signed)
Your physician wants you to follow-up in: 6 months with Dr. Nishan. You will receive a reminder letter in the mail two months in advance. If you don't receive a letter, please call our office to schedule the follow-up appointment.  Your physician recommends that you continue on your current medications as directed. Please refer to the Current Medication list given to you today.  

## 2013-09-25 NOTE — Assessment & Plan Note (Signed)
Chronic  Will get records form Dr Jacinto Halim regarding myovue and echo to make sure no other structural heart disease Yearly ECG

## 2013-09-25 NOTE — Assessment & Plan Note (Signed)
Encouraged him to wear CPAP regarding sleep benefits and prevention of cardiac events and arrhythmia Relationship to obesity also discussed

## 2013-09-25 NOTE — Assessment & Plan Note (Signed)
Major issue relating to fatigue and dyspnea  Discussed [place] and [employer]

## 2013-09-25 NOTE — Assessment & Plan Note (Signed)
Well controlled.  Continue current medications and low sodium Dash type diet.    

## 2013-10-22 ENCOUNTER — Encounter: Payer: Self-pay | Admitting: Neurology

## 2013-12-10 ENCOUNTER — Ambulatory Visit: Payer: BC Managed Care – PPO | Admitting: Neurology

## 2014-01-10 ENCOUNTER — Encounter: Payer: Self-pay | Admitting: Gastroenterology

## 2014-01-28 ENCOUNTER — Encounter: Payer: Self-pay | Admitting: Neurology

## 2014-01-29 ENCOUNTER — Ambulatory Visit: Payer: BC Managed Care – PPO | Admitting: Neurology

## 2014-02-03 ENCOUNTER — Ambulatory Visit: Payer: BC Managed Care – PPO | Admitting: Neurology

## 2014-02-13 ENCOUNTER — Ambulatory Visit: Payer: BC Managed Care – PPO | Admitting: Neurology

## 2014-02-20 ENCOUNTER — Encounter (INDEPENDENT_AMBULATORY_CARE_PROVIDER_SITE_OTHER): Payer: Self-pay

## 2014-02-20 ENCOUNTER — Encounter: Payer: Self-pay | Admitting: Neurology

## 2014-02-20 ENCOUNTER — Ambulatory Visit (INDEPENDENT_AMBULATORY_CARE_PROVIDER_SITE_OTHER): Payer: 59 | Admitting: Neurology

## 2014-02-20 VITALS — BP 159/97 | HR 76 | Resp 18 | Ht 65.75 in | Wt 245.0 lb

## 2014-02-20 DIAGNOSIS — F909 Attention-deficit hyperactivity disorder, unspecified type: Secondary | ICD-10-CM

## 2014-02-20 DIAGNOSIS — F900 Attention-deficit hyperactivity disorder, predominantly inattentive type: Secondary | ICD-10-CM | POA: Insufficient documentation

## 2014-02-20 DIAGNOSIS — E669 Obesity, unspecified: Secondary | ICD-10-CM

## 2014-02-20 MED ORDER — AMPHETAMINE-DEXTROAMPHET ER 20 MG PO CP24: 20.0000 mg | ORAL_CAPSULE | Freq: Every day | ORAL | Status: DC

## 2014-02-20 NOTE — Patient Instructions (Signed)

## 2014-02-20 NOTE — Progress Notes (Signed)
[facility]  Provider:  Melvyn Novas, M D  Referring Provider: No ref. provider found Primary Care Physician:  Dr.  Vianne Bulls.  Chief Complaint  Patient presents with  . Follow-up    Room 11  . Sleep Apnea    HPI:  [name] is a 58 y.o. male  Is seen here as a  revisit  from Dr. Ezzard Henderson after a recent nasal -septal surgery.  [name]  is a right-handed, 58 year old gentleman of [name] descent,  who originally presented to [facility] with a complaint of progressively louder snoring that had failed to be controlled by a mouth guard appliance .   He brought his 26 ay download , see assessment and plan.   He had this device made for him by his dentist. The patient attributed his progressive snoring over the last 3 years to having been physically less active. He gained weight especially after 2 hip surgeries were performed. 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 He had 2 hip replacements  and he gained weight,  begun to snore and felt increasingly un-restored in the morning. He awoke with a dry mouth, phlegm obstructed. his girlfriend  has witnessed apneas as well,  but he had only reluctantly reported those . He also endorsed nocturia 2-3 times at night and waking up every 2 hours. He reduced his Diet Coke intake, and still woke up every 2 hours,  tried to compensate his EDS  by taking daytime naps of up to 30 minutes. 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  He is also noted headaches in the morning but these get better as the day goes by. He has not been awoken by severe headaches however. The above history let to a polysomnography evaluation on 07-30-2012.   At  the time the patient and [name] Epworth score at 16 points and the backs inventory at 12 points is a BMI of 39.1 and a neck circumference of 18.5 inches. Meanwhile the also learned that he had a left bundle branch block. The study revealed an AHI of 88.8 but was not REM accentuated and supine versus nonsupine sleep position seemed not to play a role.   The patient suffered severe hypoxemia at night- his desaturation reached a nadir of 63% with overall time of 140s 6.8 minutes at or below 89% oxygen saturation. Periodic limb movements were not noted,  neither was a tachy-brady-cardia arrhythmias seen.   The severity of the complex and severe sleep apnea qualified for a split night protocol.  The patient found himself unable to tolerate CPAP mask , yet any nasal airflow at all.  For this reason he was referred for an ENT evaluation. He was told by his ENT physician that he likely only gained about 20% more patent after nasal septal surgery however he feels already that it has improved his overall breathing and he feels now recovered enough to make an attempt to use CPAP again.   Review of Systems: Out of a complete 14 system review, the patient complains of only the following symptoms, and all other reviewed systems are negative.  The patient endorsed in the past  and at the sleepiness score of 18 points, today at 11 point he stated that he had been several days off after the surgery. He still has nocturia, dry mouth in the morning and neck / hypnic headaches.  History   Social History  . Marital Status: Single    Spouse Name: N/A    Number of Children: 0  . Years of Education: 16   Occupational History  . Not on file.   Social History Main Topics  . Smoking status: Never Smoker   . Smokeless tobacco: Never Used  . Alcohol Use: No  . Drug Use: Not on file  . Sexual Activity: Not on file   Other Topics Concern  . Not on file   Social History Narrative   Patient is married and lives  alone.   Patient is working full-time.   Patient has a college education.   Patient is right-handed.   Patient drinks 5 cups of tea and sodas daily.    Family History  Problem Relation Age of Onset  . Ataxia Neg Hx   . Chorea Neg Hx   . Dementia Neg Hx   . Mental retardation Neg Hx   . Migraines Neg Hx   . Multiple sclerosis Neg Hx   . Neurofibromatosis Neg Hx   . Neuropathy Neg Hx   . Parkinsonism Neg Hx   . Seizures Neg Hx   . Stroke Neg Hx     Past Medical History  Diagnosis Date  . GOUT   . EXOGENOUS OBESITY   . ARTHRITIS   . CHONDROMALACIA PATELLA, LEFT   . HIP REPLACEMENT, BILATERAL, HX OF   . SOB (shortness of breath)   . Obstructive apnea     patient could not tolerate CPAP due to nasal airflow restriction.     Past Surgical History  Procedure Laterality Date  . Nasal sinus surgery      Dr . Haroldine Laws , October 2014 , nasal septum     Current Outpatient Prescriptions  Medication Sig Dispense Refill  . carvedilol (COREG) 6.25 MG tablet Take 6.25 mg by mouth 2 (two) times daily with a meal.      . dutasteride (AVODART) 0.5 MG capsule Take 0.5 mg by mouth daily.      . indomethacin (INDOCIN) 50 MG capsule Take 50 mg by mouth 2 (two) times daily with a meal.      . olmesartan-hydrochlorothiazide (BENICAR HCT) 40-25 MG per tablet Take 1 tablet by mouth daily.      . rosuvastatin (CRESTOR) 20 MG tablet Take 1 tablet (20 mg total) by mouth daily.  90 tablet  3  . tamsulosin (FLOMAX) 0.4 MG CAPS capsule Take 0.4 mg by mouth daily.       No current facility-administered medications for this visit.    Allergies as of 02/20/2014  . (No Known Allergies)    Vitals: BP 159/97  Pulse 76  Resp 18  Ht 5' 5.75" (1.67 m)  Wt 245 lb (111.131 kg)  BMI 39.85 kg/m2 Last Weight:  Wt Readings from Last 1 Encounters:  02/20/14 245 lb (111.131 kg)   Last Height:   Ht Readings from Last 1 Encounters:  02/20/14 5' 5.75" (1.67 m)    Physical exam:  General: The  patient is awake, alert and appears not in acute distress. The patient is well groomed. Head: Normocephalic, atraumatic. Neck is supple. Mallampati 4, neck circumference: 18.25 inches ,  Cardiovascular:  Regular rate and rhythm , without  murmurs or carotid bruit, and without distended neck veins. No TMJ clicking , nasal passage on the left open, and still restricted on the right.  Respiratory: Lungs are clear to auscultation. Skin:  Without evidence of edema, or rash Trunk: BMI is elevated, the patient lost 20 pounds.-and patient  has normal posture.  Neurologic exam : The patient is awake and alert, oriented to place and time.  Memory subjective  described as intact.  There is a normal attention span & concentration ability. Speech is fluent without  dysarthria, dysphonia or aphasia.  Mood and affect are appropriate.  Cranial nerves: Pupils are equal and briskly reactive to light. Funduscopic exam without  evidence of pallor or edema.  Extraocular movements  in vertical and horizontal planes intact and without nystagmus. Visual fields by finger perimetry are intact. Hearing to finger rub intact- bone conduction stronger left than right.  Facial sensation intact to fine touch. Facial motor strength is symmetric and tongue and uvula move midline.  Motor exam:    Normal tone and normal muscle bulk and symmetric normal strength in all extremities.  Sensory:  Fine touch, pinprick and vibration were tested in all extremities.  Proprioception is tested in the upper extremities only. This was  normal.  Coordination: Rapid alternating movements in the fingers/hands is tested and without evidence of ataxia, dysmetria or tremor.  Gait and station: Patient walks without assistive device - Strength within normal limits. Stance is stable and normal.  Deep tendon reflexes: in the  upper and lower extremities are symmetric and intact. Babinski maneuver downgoing.     Assessment/ Plan   After physical  and neurologic examination, review of laboratory studies, imaging, neurophysiology testing and pre-existing records, assessment is   1) severe OSA , with resulting high EDS and severe  fatigue, collaborated by his girlfriend's account of witnessed snoring .  positive PSG for mixed apneas at AHI 88. See above  2) obesity, patient was able to lose some weight. Continue low carb diet . He drinks green tea now , not longer soda.  3) nasal septal deviation, now partially corrected and healed since surgery.     Saline nose spray and steroids can be used for the initial autotitration period.  4) Mallampotti still 4. 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  6) HTN -  Nocturia and EDS, fatigue- has been  responding to CPAP, only one bathroom break . Marland Kitchen  7) patient reports becoming easily choked up. Tearful - antidepressant use? wellbutrin.  8) ADD  discussed, he may still have a residual type, he is easily distracted ADD. Trial of adderall.    Plan:  Treatment plan and additional workup :   continue CPAP at current setting.   Mr. LAMANTIA referred today to the fatigue severity score at 17 points and the Epworth sleepiness score at 9 points.  Her CPAP download dated 01-20-14 chills 97% compliance, average  user time 4 hours and 27 minutes, the patient uses an auto- set titration between 8 and 12 cm water.  His 95% percentile pressure,  is 11.5 cm his AHI is 4.9. There is no adjustment necessary. He uses a nasal pillow.   Adderall 20 mg XR po.   RV in 6 month with NP , CPAP>

## 2014-08-21 ENCOUNTER — Ambulatory Visit: Payer: 59 | Admitting: Adult Health

## 2015-02-10 NOTE — Progress Notes (Signed)
Please put orders in Epic surgery 03-03-15 pre op 02-22-15 Thanks

## 2015-02-11 ENCOUNTER — Ambulatory Visit: Payer: Self-pay | Admitting: Orthopedic Surgery

## 2015-02-11 NOTE — Progress Notes (Signed)
Preoperative surgical orders have been place into the Epic hospital system for JAFFET SILSBY on 02/11/2015, 8:51 AM  by Patrica Duel for surgery on 03-03-2015.  Preop Total Hip orders including Experel Injecion, IV Tylenol, and IV Decadron as long as there are no contraindications to the above medications. Avel Peace, PA-C

## 2015-02-17 NOTE — Patient Instructions (Addendum)
[name]  02/17/2015   Your procedure is scheduled on: Wednesday 03/03/2015  Report to [facility]  Entrance take [facility]  elevators to 3rd floor to  Short Stay Center at  115 PM.  Call this number if you have problems the morning of surgery [phone]   Remember: ONLY 1 PERSON MAY GO WITH YOU TO SHORT STAY TO GET  READY MORNING OF YOUR SURGERY.   Do not eat food  :After Midnight.MAY HAVE CLEAR LIQUIDS FROM MIDNIGHT UP UNTIL 0915 AM THEN NOTHING UNTIL AFTER SURGERY!               BRING CPAP MASK AND TUBING WITH YOU AM OF SURGERY!   Take these medicines the morning of surgery with A SIP OF WATER: Carvedilol (Coreg)                               You may not have any metal on your body including hair pins and              piercings  Do not wear jewelry, make-up, lotions, powders or perfumes, deodorant             Do not wear nail polish.  Do not shave  48 hours prior to surgery.              Men may shave face and neck.   Do not bring valuables to the hospital. [facility] IS NOT             RESPONSIBLE   FOR VALUABLES.  Contacts, dentures or bridgework may not be worn into surgery.  Leave suitcase in the car. After surgery it may be brought to your room.     Patients discharged the day of surgery will not be allowed to drive home.  Name and phone number of your driver:  Special Instructions: N/A              Please read over the following fact sheets you were given: _____________________________________________________________________             [facility] - Preparing for Surgery Before surgery, you can play an important role.  Because skin is not sterile, your skin needs to be as free of germs as possible.  You can reduce the number of germs on your skin by washing with CHG (chlorahexidine gluconate) soap before surgery.  CHG is an antiseptic cleaner which kills germs and bonds with the skin to continue killing germs even after washing. Please  DO NOT use if you have an allergy to CHG or antibacterial soaps.  If your skin becomes reddened/irritated stop using the CHG and inform your nurse when you arrive at Short Stay. Do not shave (including legs and underarms) for at least 48 hours prior to the first CHG shower.  You may shave your face/neck. Please follow these instructions carefully:  1.  Shower with CHG Soap the night before surgery and the  morning of Surgery.  2.  If you choose to wash your hair, wash your hair first as usual with your  normal  shampoo.  3.  After you shampoo, rinse your hair and body thoroughly to remove the  shampoo.  4.  Use CHG as you would any other liquid soap.  You can apply chg directly  to the skin and wash                       Gently with a scrungie or clean washcloth.  5.  Apply the CHG Soap to your body ONLY FROM THE NECK DOWN.   Do not use on face/ open                           Wound or open sores. Avoid contact with eyes, ears mouth and genitals (private parts).                       Wash face,  Genitals (private parts) with your normal soap.             6.  Wash thoroughly, paying special attention to the area where your surgery  will be performed.  7.  Thoroughly rinse your body with warm water from the neck down.  8.  DO NOT shower/wash with your normal soap after using and rinsing off  the CHG Soap.                9.  Pat yourself dry with a clean towel.            10.  Wear clean pajamas.            11.  Place clean sheets on your bed the night of your first shower and do not  sleep with pets. Day of Surgery : Do not apply any lotions/deodorants the morning of surgery.  Please wear clean clothes to the hospital/surgery center.  FAILURE TO FOLLOW THESE INSTRUCTIONS MAY RESULT IN THE CANCELLATION OF YOUR SURGERY PATIENT SIGNATURE_________________________________  NURSE  SIGNATURE__________________________________  ________________________________________________________________________   [name]  An incentive spirometer is a tool that can help keep your lungs clear and active. This tool measures how well you are filling your lungs with each breath. Taking long deep breaths may help reverse or decrease the chance of developing breathing (pulmonary) problems (especially infection) following:  A long period of time when you are unable to move or be active. BEFORE THE PROCEDURE   If the spirometer includes an indicator to show your best effort, your nurse or respiratory therapist will set it to a desired goal.  If possible, sit up straight or lean slightly forward. Try not to slouch.  Hold the incentive spirometer in an upright position. INSTRUCTIONS FOR USE   Sit on the edge of your bed if possible, or sit up as far as you can in bed or on a chair.  Hold the incentive spirometer in an upright position.  Breathe out normally.  Place the mouthpiece in your mouth and seal your lips tightly around it.  Breathe in slowly and as deeply as possible, raising the piston or the ball toward the top of the column.  Hold your breath for 3-5 seconds or for as long as possible. Allow the piston or ball to fall to the bottom of the column.  Remove the mouthpiece from your mouth and breathe out normally.  Rest for a few seconds and repeat Steps 1 through 7 at least 10 times every 1-2 hours when you are awake. Take your time and take a few normal breaths between deep breaths.  The spirometer may include an indicator to show  your best effort. Use the indicator as a goal to work toward during each repetition.  After each set of 10 deep breaths, practice coughing to be sure your lungs are clear. If you have an incision (the cut made at the time of surgery), support your incision when coughing by placing a pillow or rolled up towels firmly against it. Once  you are able to get out of bed, walk around indoors and cough well. You may stop using the incentive spirometer when instructed by your caregiver.  RISKS AND COMPLICATIONS  Take your time so you do not get dizzy or light-headed.  If you are in pain, you may need to take or ask for pain medication before doing incentive spirometry. It is harder to take a deep breath if you are having pain. AFTER USE  Rest and breathe slowly and easily.  It can be helpful to keep track of a log of your progress. Your caregiver can provide you with a simple table to help with this. If you are using the spirometer at home, follow these instructions: [place] IF:   You are having difficultly using the spirometer.  You have trouble using the spirometer as often as instructed.  Your pain medication is not giving enough relief while using the spirometer.  You develop fever of 100.5 F (38.1 C) or higher. SEEK IMMEDIATE MEDICAL CARE IF:   You cough up bloody sputum that had not been present before.  You develop fever of 102 F (38.9 C) or greater.  You develop worsening pain at or near the incision site. MAKE SURE YOU:   Understand these instructions.  Will watch your condition.  Will get help right away if you are not doing well or get worse. Document Released: 10/02/2006 Document Revised: 08/14/2011 Document Reviewed: 12/03/2006 ExitCare Patient Information 2014 ExitCare, [place].   ________________________________________________________________________  WHAT IS A BLOOD TRANSFUSION? Blood Transfusion Information  A transfusion is the replacement of blood or some of its parts. Blood is made up of multiple cells which provide different functions.  Red blood cells carry oxygen and are used for blood loss replacement.  White blood cells fight against infection.  Platelets control bleeding.  Plasma helps clot blood.  Other blood products are available for specialized needs, such as  hemophilia or other clotting disorders. BEFORE THE TRANSFUSION  Who gives blood for transfusions?   Healthy volunteers who are fully evaluated to make sure their blood is safe. This is blood bank blood. Transfusion therapy is the safest it has ever been in the practice of medicine. Before blood is taken from a donor, a complete history is taken to make sure that person has no history of diseases nor engages in risky social behavior (examples are intravenous drug use or sexual activity with multiple partners). The donor's travel history is screened to minimize risk of transmitting infections, such as malaria. The donated blood is tested for signs of infectious diseases, such as HIV and hepatitis. The blood is then tested to be sure it is compatible with you in order to minimize the chance of a transfusion reaction. If you or a relative donates blood, this is often done in anticipation of surgery and is not appropriate for emergency situations. It takes many days to process the donated blood. RISKS AND COMPLICATIONS Although transfusion therapy is very safe and saves many lives, the main dangers of transfusion include:   Getting an infectious disease.  Developing a transfusion reaction. This is an allergic reaction to  something in the blood you were given. Every precaution is taken to prevent this. The decision to have a blood transfusion has been considered carefully by your caregiver before blood is given. Blood is not given unless the benefits outweigh the risks. AFTER THE TRANSFUSION  Right after receiving a blood transfusion, you will usually feel much better and more energetic. This is especially true if your red blood cells have gotten low (anemic). The transfusion raises the level of the red blood cells which carry oxygen, and this usually causes an energy increase.  The nurse administering the transfusion will monitor you carefully for complications. HOME CARE INSTRUCTIONS  No special  instructions are needed after a transfusion. You may find your energy is better. Speak with your caregiver about any limitations on activity for underlying diseases you may have. SEEK MEDICAL CARE IF:   Your condition is not improving after your transfusion.  You develop redness or irritation at the intravenous (IV) site. SEEK IMMEDIATE MEDICAL CARE IF:  Any of the following symptoms occur over the next 12 hours:  Shaking chills.  You have a temperature by mouth above 102 F (38.9 C), not controlled by medicine.  Chest, back, or muscle pain.  People around you feel you are not acting correctly or are confused.  Shortness of breath or difficulty breathing.  Dizziness and fainting.  You get a rash or develop hives.  You have a decrease in urine output.  Your urine turns a dark color or changes to pink, red, or brown. Any of the following symptoms occur over the next 10 days:  You have a temperature by mouth above 102 F (38.9 C), not controlled by medicine.  Shortness of breath.  Weakness after normal activity.  The white part of the eye turns yellow (jaundice).  You have a decrease in the amount of urine or are urinating less often.  Your urine turns a dark color or changes to pink, red, or brown. Document Released: 05/19/2000 Document Revised: 08/14/2011 Document Reviewed: 01/06/2008 ExitCare Patient Information 2014 ExitCare, [place].  _______________________________________________________________________   CLEAR LIQUID DIET   Foods Allowed                                                                     Foods Excluded  Coffee and tea, regular and decaf                             liquids that you cannot  Plain Jell-O in any flavor                                             see through such as: Fruit ices (not with fruit pulp)                                     milk, soups, orange juice  Iced Popsicles  All solid  food Carbonated beverages, regular and diet                                    Cranberry, grape and apple juices Sports drinks like Gatorade Lightly seasoned clear broth or consume(fat free) Sugar, honey syrup  Sample Menu Breakfast                                Lunch                                     Supper Cranberry juice                    Beef broth                            Chicken broth Jell-O                                     Grape juice                           Apple juice Coffee or tea                        Jell-O                                      Popsicle                                                Coffee or tea                        Coffee or tea  _____________________________________________________________________

## 2015-02-22 ENCOUNTER — Encounter (HOSPITAL_COMMUNITY): Payer: Self-pay

## 2015-02-22 ENCOUNTER — Encounter (HOSPITAL_COMMUNITY)
Admission: RE | Admit: 2015-02-22 | Discharge: 2015-02-22 | Disposition: A | Discharge status: Home / Self Care | Payer: BLUE CROSS/BLUE SHIELD | Source: Ambulatory Visit | Attending: Orthopedic Surgery | Admitting: Orthopedic Surgery

## 2015-02-22 DIAGNOSIS — Z01818 Encounter for other preprocedural examination: Secondary | ICD-10-CM | POA: Insufficient documentation

## 2015-02-22 HISTORY — DX: Essential (primary) hypertension: I10

## 2015-02-22 HISTORY — DX: Unspecified osteoarthritis, unspecified site: M19.90

## 2015-02-22 LAB — CBC
HCT: 49.5 % (ref 39.0–52.0)
Hemoglobin: 16.5 g/dL (ref 13.0–17.0)
MCH: 28.9 pg (ref 26.0–34.0)
MCHC: 33.3 g/dL (ref 30.0–36.0)
MCV: 86.8 fL (ref 78.0–100.0)
Platelets: 201 10*3/uL (ref 150–400)
RBC: 5.7 MIL/uL (ref 4.22–5.81)
RDW: 15 % (ref 11.5–15.5)
WBC: 11.1 10*3/uL — ABNORMAL HIGH (ref 4.0–10.5)

## 2015-02-22 LAB — COMPREHENSIVE METABOLIC PANEL
ALBUMIN: 4.4 g/dL (ref 3.5–5.0)
ALK PHOS: 61 U/L (ref 38–126)
ALT: 46 U/L (ref 17–63)
ANION GAP: 8 (ref 5–15)
AST: 39 U/L (ref 15–41)
BILIRUBIN TOTAL: 0.7 mg/dL (ref 0.3–1.2)
BUN: 27 mg/dL — ABNORMAL HIGH (ref 6–20)
CALCIUM: 8.9 mg/dL (ref 8.9–10.3)
CO2: 24 mmol/L (ref 22–32)
Chloride: 101 mmol/L (ref 101–111)
Creatinine, Ser: 0.87 mg/dL (ref 0.61–1.24)
GFR calc non Af Amer: >60 mL/min (ref >60)
GLUCOSE: 90 mg/dL (ref 65–99)
POTASSIUM: 4.2 mmol/L (ref 3.5–5.1)
SODIUM: 133 mmol/L — AB (ref 135–145)
TOTAL PROTEIN: 7.5 g/dL (ref 6.5–8.1)

## 2015-02-22 LAB — URINALYSIS, ROUTINE W REFLEX MICROSCOPIC
BILIRUBIN URINE: NEGATIVE
GLUCOSE, UA: NEGATIVE mg/dL
Ketones, ur: NEGATIVE mg/dL
Leukocytes, UA: NEGATIVE
Nitrite: NEGATIVE
PH: 5.5 (ref 5.0–8.0)
Protein, ur: NEGATIVE mg/dL
SPECIFIC GRAVITY, URINE: 1.016 (ref 1.005–1.030)
UROBILINOGEN UA: 0.2 mg/dL (ref 0.0–1.0)

## 2015-02-22 LAB — URINE MICROSCOPIC-ADD ON

## 2015-02-22 LAB — SURGICAL PCR SCREEN
MRSA, PCR: NEGATIVE
Staphylococcus aureus: NEGATIVE

## 2015-02-22 LAB — PROTIME-INR
INR: 1.05 (ref 0.00–1.49)
Prothrombin Time: 13.9 seconds (ref 11.6–15.2)

## 2015-02-22 LAB — APTT: APTT: 29 s (ref 24–37)

## 2015-02-23 ENCOUNTER — Ambulatory Visit: Payer: Self-pay | Admitting: Orthopedic Surgery

## 2015-02-23 NOTE — H&P (Signed)
[name] DOB: 1955-07-29 Single / Language: [place] / Race: White Male Date of Admission:  03/03/2015 CC:  Right Hip Pain History of Present Illness The patient is a 59 year old male who comes in for a preoperative History and Physical. The patient is scheduled for a right hip bearing surface versus total hip revision to be performed by Dr. Gus Rankin. Aluisio, MD at [facility] on 03-03-2015. The patient is a 59 year old male who presented for follow up of their hip. The patient is being followed for their right hip pain. Symptoms reported include: pain and pain with weightbearing. The patient reported their pain level to be 7 ou of 10. Current treatment includes: NSAIDs. The following medication has been used for pain control: antiinflammatory medication. The patient presented in follow up after an MRI. The patient has reported improvement of their symptoms with: conservative measures and NSAIDs. Ray continues with significant lateral hip pain. It is worse with weightbearing and in his rest. He had the MARS MRI scan and has had some blood work done to evaluate for the metal-on-metal hip. The scan did not show any fluid collections but there appeared to be some osteolysis around the greater trochanter. His blood work showed that his cobalt level has increased to 4.9 and chromium level remained normal at 2.1. It is felt that he would benefit from undergoing a bearing exchange versus total hip revision. They have been treated conservatively in the past for the above stated problem and despite conservative measures, they continue to have progressive pain and severe functional limitations and dysfunction. They have failed non-operative management including home exercise, medications. It is felt that they would benefit from undergoing hip bearing exchange versus total joint revision. Risks and benefits of the procedure have been discussed with the patient and they elect to proceed with surgery. There  are no active contraindications to surgery such as ongoing infection or rapidly progressive neurological disease.  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 Hypertension Father. Rheumatoid Arthritis Father.  Social History Exercise Exercises weekly; does gym / weights Children 0 Marital status single Living situation live alone Current work status working full time Most recent primary occupation Sports administrator Never consumed alcohol 08/14/2013: Never consumed alcohol No history of drug/alcohol rehab Not under pain contract Number of flights of stairs before winded greater than 5 Tobacco / smoke exposure 08/14/2013: no Tobacco use Never smoker. 08/14/2013  Medication History Aleve (  Tablet, Oral) Active. Uloric (  Tablet, Oral) Active. Zolpidem Tartrate (  Tablet, Oral) Active. Crestor (  Tablet, Oral) Active. Lisinopril (  Tablet, Oral) Active. Tamsulosin HCl (0.4MG  Capsule, Oral) Active.  Past Surgical History Total Hip Replacement bilateral  Review of Systems  General Not Present- Chills, Fatigue, Fever, Memory Loss, Night Sweats, Weight Gain and Weight Loss. Skin Not Present- Eczema, Hives, Itching, Lesions and Rash. HEENT Not Present- Dentures, Double Vision, Headache, Hearing Loss, Tinnitus and Visual Loss. Respiratory Not Present- Allergies, Chronic Cough, Coughing up blood, Shortness of breath at rest and Shortness of breath with exertion. Cardiovascular Not Present- Chest Pain, Difficulty Breathing Lying Down, Murmur, Palpitations, Racing/skipping heartbeats and Swelling. Gastrointestinal Not Present- Abdominal Pain, Bloody Stool, Constipation, Diarrhea, Difficulty Swallowing,  Heartburn, Jaundice, Loss of appetitie, Nausea and Vomiting. Male Genitourinary Not Present- Blood in Urine, Discharge, Flank Pain, Incontinence,  Painful Urination, Urgency, Urinary frequency, Urinary Retention, Urinating at Night and Weak urinary stream. Musculoskeletal Present- Joint Pain (Hip pain). Not Present- Back Pain, Joint Swelling, Morning Stiffness, Muscle Pain, Muscle Weakness and Spasms. Neurological Not Present- Blackout spells, Difficulty with balance, Dizziness, Paralysis, Tremor and Weakness. Psychiatric Not Present- Insomnia.  Vitals Weight: 245 lb Height: 66in Body Surface Area: 2.18 m Body Mass Index: 39.54 kg/m  BP: 146/82 (Sitting, Right Arm, Standard)  Physical Exam General Mental Status -Alert, cooperative and good historian. General Appearance-pleasant, Not in acute distress. Orientation-Oriented X3. Build & Nutrition-Well nourished and Well developed.  Head and Neck Head-normocephalic, atraumatic . Neck Global Assessment - supple, no bruit auscultated on the right, no bruit auscultated on the left.  Eye Pupil - Bilateral-Regular and Round. Motion - Bilateral-EOMI.  Chest and Lung Exam Auscultation Breath sounds - clear at anterior chest wall and clear at posterior chest wall. Adventitious sounds - No Adventitious sounds.  Cardiovascular Auscultation Rhythm - Regular rate and rhythm. Heart Sounds - S1 WNL and S2 WNL. Murmurs & Other Heart Sounds - Auscultation of the heart reveals - No Murmurs.  Abdomen Palpation/Percussion Tenderness - Abdomen is non-tender to palpation. Rigidity (guarding) - Abdomen is soft. Auscultation Auscultation of the abdomen reveals - Bowel sounds normal.  Male Genitourinary Note: Not done, not pertinent to present illness  Musculoskeletal Note: Well-developed male in no distress. His right hip can be flexed to about 110, rotate in 20 and out 30, and abduct 30 without discomfort on range of motion  but with discomfort to palpation at the greater trochanter.  IMAGING AND STUDIES MRI scan. No fluid collections, but appears to be some osteolysis around the greater trochanter. His blood work shows that his cobalt level is now increased to 4.9 and chromium level remains normal at 2.1.  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 Ludwig Clarks  IV TXA  Anesthesia Issues: Postop Nausea in the past.  Signed electronically by Lauraine Rinne, III PA-C

## 2015-03-03 ENCOUNTER — Inpatient Hospital Stay (HOSPITAL_COMMUNITY): Payer: BLUE CROSS/BLUE SHIELD | Admitting: Anesthesiology

## 2015-03-03 ENCOUNTER — Inpatient Hospital Stay (HOSPITAL_COMMUNITY)
Admission: RE | Admit: 2015-03-03 | Discharge: 2015-03-04 | DRG: 468 | Disposition: A | Discharge status: Home Health | Payer: BLUE CROSS/BLUE SHIELD | Source: Ambulatory Visit | Attending: Orthopedic Surgery | Admitting: Orthopedic Surgery

## 2015-03-03 ENCOUNTER — Inpatient Hospital Stay (HOSPITAL_COMMUNITY): Payer: BLUE CROSS/BLUE SHIELD

## 2015-03-03 ENCOUNTER — Encounter (HOSPITAL_COMMUNITY): Payer: Self-pay | Admitting: *Deleted

## 2015-03-03 ENCOUNTER — Encounter (HOSPITAL_COMMUNITY)
Admission: RE | Disposition: A | Discharge status: Home Health | Payer: Self-pay | Source: Ambulatory Visit | Attending: Orthopedic Surgery

## 2015-03-03 DIAGNOSIS — M109 Gout, unspecified: Secondary | ICD-10-CM | POA: Diagnosis present

## 2015-03-03 DIAGNOSIS — Z79899 Other long term (current) drug therapy: Secondary | ICD-10-CM | POA: Diagnosis not present

## 2015-03-03 DIAGNOSIS — I10 Essential (primary) hypertension: Secondary | ICD-10-CM | POA: Diagnosis present

## 2015-03-03 DIAGNOSIS — G4730 Sleep apnea, unspecified: Secondary | ICD-10-CM | POA: Diagnosis present

## 2015-03-03 DIAGNOSIS — Z8249 Family history of ischemic heart disease and other diseases of the circulatory system: Secondary | ICD-10-CM | POA: Diagnosis not present

## 2015-03-03 DIAGNOSIS — T84018A Broken internal joint prosthesis, other site, initial encounter: Secondary | ICD-10-CM

## 2015-03-03 DIAGNOSIS — Z01812 Encounter for preprocedural laboratory examination: Secondary | ICD-10-CM

## 2015-03-03 DIAGNOSIS — Z6839 Body mass index (BMI) 39.0-39.9, adult: Secondary | ICD-10-CM

## 2015-03-03 DIAGNOSIS — Z7982 Long term (current) use of aspirin: Secondary | ICD-10-CM | POA: Diagnosis not present

## 2015-03-03 DIAGNOSIS — Z8261 Family history of arthritis: Secondary | ICD-10-CM | POA: Diagnosis not present

## 2015-03-03 DIAGNOSIS — Z01810 Encounter for preprocedural cardiovascular examination: Secondary | ICD-10-CM | POA: Diagnosis not present

## 2015-03-03 DIAGNOSIS — M25551 Pain in right hip: Secondary | ICD-10-CM | POA: Diagnosis present

## 2015-03-03 DIAGNOSIS — G8929 Other chronic pain: Secondary | ICD-10-CM | POA: Diagnosis present

## 2015-03-03 DIAGNOSIS — T84099A Other mechanical complication of unspecified internal joint prosthesis, initial encounter: Secondary | ICD-10-CM | POA: Diagnosis present

## 2015-03-03 DIAGNOSIS — M1990 Unspecified osteoarthritis, unspecified site: Secondary | ICD-10-CM | POA: Diagnosis present

## 2015-03-03 DIAGNOSIS — Z96649 Presence of unspecified artificial hip joint: Secondary | ICD-10-CM

## 2015-03-03 DIAGNOSIS — Y792 Prosthetic and other implants, materials and accessory orthopedic devices associated with adverse incidents: Secondary | ICD-10-CM | POA: Diagnosis present

## 2015-03-03 DIAGNOSIS — Z833 Family history of diabetes mellitus: Secondary | ICD-10-CM | POA: Diagnosis not present

## 2015-03-03 HISTORY — PX: TOTAL HIP REVISION: SHX763

## 2015-03-03 LAB — TYPE AND SCREEN
ABO/RH(D): AB POS
ANTIBODY SCREEN: NEGATIVE

## 2015-03-03 SURGERY — TOTAL HIP REVISION
Anesthesia: General | Site: Hip | Laterality: Right

## 2015-03-03 MED ORDER — OXYCODONE HCL 5 MG PO TABS
5.0000 mg | ORAL_TABLET | ORAL | Status: DC | PRN
  Administered 2015-03-04 (×2): 10 mg via ORAL
  Filled 2015-03-03 (×2): qty 2

## 2015-03-03 MED ORDER — CEFAZOLIN SODIUM-DEXTROSE 2-3 GM-% IV SOLR
2.0000 g | Freq: Four times a day (QID) | INTRAVENOUS | Status: AC
  Administered 2015-03-03 – 2015-03-04 (×2): 2 g via INTRAVENOUS
  Filled 2015-03-03 (×2): qty 50

## 2015-03-03 MED ORDER — GLYCOPYRROLATE 0.2 MG/ML IJ SOLN
INTRAMUSCULAR | Status: AC
Start: 2015-03-03 — End: 2015-03-03
  Filled 2015-03-03: qty 2

## 2015-03-03 MED ORDER — TRANEXAMIC ACID 1000 MG/10ML IV SOLN
1000.0000 mg | INTRAVENOUS | Status: AC
  Administered 2015-03-03: 1000 mg via INTRAVENOUS
  Filled 2015-03-03: qty 10

## 2015-03-03 MED ORDER — PROMETHAZINE HCL 25 MG/ML IJ SOLN: 6.2500 mg | INTRAMUSCULAR | Status: DC | PRN

## 2015-03-03 MED ORDER — LIDOCAINE HCL (CARDIAC) 20 MG/ML IV SOLN
INTRAVENOUS | Status: DC | PRN
  Administered 2015-03-03: 80 mg via INTRAVENOUS

## 2015-03-03 MED ORDER — METHOCARBAMOL 1000 MG/10ML IJ SOLN
500.0000 mg | Freq: Four times a day (QID) | INTRAVENOUS | Status: DC | PRN
  Administered 2015-03-03: 500 mg via INTRAVENOUS
  Filled 2015-03-03 (×2): qty 5

## 2015-03-03 MED ORDER — TAMSULOSIN HCL 0.4 MG PO CAPS
0.4000 mg | ORAL_CAPSULE | Freq: Every day | ORAL | Status: DC
  Administered 2015-03-04: 0.4 mg via ORAL
  Filled 2015-03-03: qty 1

## 2015-03-03 MED ORDER — ROCURONIUM BROMIDE 100 MG/10ML IV SOLN
INTRAVENOUS | Status: DC | PRN
  Administered 2015-03-03: 40 mg via INTRAVENOUS
  Administered 2015-03-03: 10 mg via INTRAVENOUS

## 2015-03-03 MED ORDER — DOCUSATE SODIUM 100 MG PO CAPS
100.0000 mg | ORAL_CAPSULE | Freq: Two times a day (BID) | ORAL | Status: DC
  Administered 2015-03-03 – 2015-03-04 (×2): 100 mg via ORAL

## 2015-03-03 MED ORDER — PROPOFOL 10 MG/ML IV BOLUS
INTRAVENOUS | Status: DC | PRN
  Administered 2015-03-03: 170 mg via INTRAVENOUS

## 2015-03-03 MED ORDER — DIPHENHYDRAMINE HCL 12.5 MG/5ML PO ELIX: 12.5000 mg | ORAL_SOLUTION | ORAL | Status: DC | PRN

## 2015-03-03 MED ORDER — MIDAZOLAM HCL 2 MG/2ML IJ SOLN
INTRAMUSCULAR | Status: AC
  Filled 2015-03-03: qty 4

## 2015-03-03 MED ORDER — ONDANSETRON HCL 4 MG PO TABS: 4.0000 mg | ORAL_TABLET | Freq: Four times a day (QID) | ORAL | Status: DC | PRN

## 2015-03-03 MED ORDER — CEFAZOLIN SODIUM-DEXTROSE 2-3 GM-% IV SOLR
INTRAVENOUS | Status: AC
  Filled 2015-03-03: qty 50

## 2015-03-03 MED ORDER — BISACODYL 10 MG RE SUPP: 10.0000 mg | Freq: Every day | RECTAL | Status: DC | PRN

## 2015-03-03 MED ORDER — DEXAMETHASONE SODIUM PHOSPHATE 10 MG/ML IJ SOLN
10.0000 mg | Freq: Once | INTRAMUSCULAR | Status: AC
Start: 2015-03-04 — End: 2015-03-04
  Administered 2015-03-04: 10 mg via INTRAVENOUS
  Filled 2015-03-03: qty 1

## 2015-03-03 MED ORDER — MENTHOL 3 MG MT LOZG: 1.0000 | LOZENGE | OROMUCOSAL | Status: DC | PRN

## 2015-03-03 MED ORDER — ACETAMINOPHEN 325 MG PO TABS: 650.0000 mg | ORAL_TABLET | Freq: Four times a day (QID) | ORAL | Status: DC | PRN

## 2015-03-03 MED ORDER — FENTANYL CITRATE (PF) 100 MCG/2ML IJ SOLN
INTRAMUSCULAR | Status: DC | PRN
  Administered 2015-03-03 (×3): 50 ug via INTRAVENOUS
  Administered 2015-03-03: 100 ug via INTRAVENOUS

## 2015-03-03 MED ORDER — HYDROMORPHONE HCL 1 MG/ML IJ SOLN
0.2500 mg | INTRAMUSCULAR | Status: DC | PRN
  Administered 2015-03-03 (×4): 0.5 mg via INTRAVENOUS

## 2015-03-03 MED ORDER — GLYCOPYRROLATE 0.2 MG/ML IJ SOLN
INTRAMUSCULAR | Status: DC | PRN
  Administered 2015-03-03: 0.2 mg via INTRAVENOUS

## 2015-03-03 MED ORDER — SODIUM CHLORIDE 0.9 % IJ SOLN
INTRAMUSCULAR | Status: AC
  Filled 2015-03-03: qty 10

## 2015-03-03 MED ORDER — PROPOFOL 10 MG/ML IV BOLUS
INTRAVENOUS | Status: AC
  Filled 2015-03-03: qty 20

## 2015-03-03 MED ORDER — ARTIFICIAL TEARS OP OINT
TOPICAL_OINTMENT | OPHTHALMIC | Status: AC
  Filled 2015-03-03: qty 3.5

## 2015-03-03 MED ORDER — DEXTROSE-NACL 5-0.9 % IV SOLN
INTRAVENOUS | Status: DC
  Administered 2015-03-03: 21:00:00 via INTRAVENOUS

## 2015-03-03 MED ORDER — ASPIRIN EC 325 MG PO TBEC
325.0000 mg | DELAYED_RELEASE_TABLET | Freq: Every day | ORAL | Status: DC
  Administered 2015-03-04: 325 mg via ORAL
  Filled 2015-03-03 (×2): qty 1

## 2015-03-03 MED ORDER — HYDROMORPHONE HCL 1 MG/ML IJ SOLN
INTRAMUSCULAR | Status: AC
  Administered 2015-03-03: 0.5 mg via INTRAVENOUS
  Filled 2015-03-03: qty 1

## 2015-03-03 MED ORDER — HYDROMORPHONE HCL 1 MG/ML IJ SOLN: 0.2500 mg | INTRAMUSCULAR | Status: DC | PRN

## 2015-03-03 MED ORDER — ONDANSETRON HCL 4 MG/2ML IJ SOLN
INTRAMUSCULAR | Status: DC | PRN
  Administered 2015-03-03 (×2): 4 mg via INTRAVENOUS

## 2015-03-03 MED ORDER — ACETAMINOPHEN 10 MG/ML IV SOLN
INTRAVENOUS | Status: AC
  Filled 2015-03-03: qty 100

## 2015-03-03 MED ORDER — ROSUVASTATIN CALCIUM 20 MG PO TABS
20.0000 mg | ORAL_TABLET | Freq: Every day | ORAL | Status: DC
  Administered 2015-03-04: 20 mg via ORAL
  Filled 2015-03-03: qty 1

## 2015-03-03 MED ORDER — TRAMADOL HCL 50 MG PO TABS: 50.0000 mg | ORAL_TABLET | Freq: Four times a day (QID) | ORAL | Status: DC | PRN

## 2015-03-03 MED ORDER — PHENYLEPHRINE 40 MCG/ML (10ML) SYRINGE FOR IV PUSH (FOR BLOOD PRESSURE SUPPORT)
PREFILLED_SYRINGE | INTRAVENOUS | Status: AC
  Filled 2015-03-03: qty 20

## 2015-03-03 MED ORDER — METHOCARBAMOL 500 MG PO TABS
500.0000 mg | ORAL_TABLET | Freq: Four times a day (QID) | ORAL | Status: DC | PRN
  Administered 2015-03-04: 500 mg via ORAL
  Filled 2015-03-03: qty 1

## 2015-03-03 MED ORDER — METOCLOPRAMIDE HCL 10 MG PO TABS: 5.0000 mg | ORAL_TABLET | Freq: Three times a day (TID) | ORAL | Status: DC | PRN

## 2015-03-03 MED ORDER — NEOSTIGMINE METHYLSULFATE 10 MG/10ML IV SOLN
INTRAVENOUS | Status: AC
  Filled 2015-03-03: qty 1

## 2015-03-03 MED ORDER — SCOPOLAMINE 1 MG/3DAYS TD PT72
MEDICATED_PATCH | TRANSDERMAL | Status: AC
  Filled 2015-03-03: qty 1

## 2015-03-03 MED ORDER — SUGAMMADEX SODIUM 500 MG/5ML IV SOLN
INTRAVENOUS | Status: DC | PRN
  Administered 2015-03-03: 442.8 mg via INTRAVENOUS

## 2015-03-03 MED ORDER — PHENYLEPHRINE HCL 10 MG/ML IJ SOLN
10.0000 mg | INTRAVENOUS | Status: DC | PRN
  Administered 2015-03-03: 50 ug/min via INTRAVENOUS

## 2015-03-03 MED ORDER — EPHEDRINE SULFATE 50 MG/ML IJ SOLN
INTRAMUSCULAR | Status: AC
  Filled 2015-03-03: qty 1

## 2015-03-03 MED ORDER — ACETAMINOPHEN 500 MG PO TABS
1000.0000 mg | ORAL_TABLET | Freq: Four times a day (QID) | ORAL | Status: DC
  Administered 2015-03-03 – 2015-03-04 (×2): 1000 mg via ORAL
  Filled 2015-03-03 (×4): qty 2

## 2015-03-03 MED ORDER — SODIUM CHLORIDE 0.9 % IV SOLN: INTRAVENOUS | Status: DC

## 2015-03-03 MED ORDER — KETOROLAC TROMETHAMINE 15 MG/ML IJ SOLN
INTRAMUSCULAR | Status: AC
  Filled 2015-03-03: qty 1

## 2015-03-03 MED ORDER — MORPHINE SULFATE (PF) 2 MG/ML IV SOLN
1.0000 mg | INTRAVENOUS | Status: DC | PRN
  Administered 2015-03-03 – 2015-03-04 (×3): 2 mg via INTRAVENOUS
  Filled 2015-03-03 (×3): qty 1

## 2015-03-03 MED ORDER — METOCLOPRAMIDE HCL 5 MG/ML IJ SOLN: 5.0000 mg | Freq: Three times a day (TID) | INTRAMUSCULAR | Status: DC | PRN

## 2015-03-03 MED ORDER — ROCURONIUM BROMIDE 100 MG/10ML IV SOLN
INTRAVENOUS | Status: AC
  Filled 2015-03-03: qty 1

## 2015-03-03 MED ORDER — DEXAMETHASONE SODIUM PHOSPHATE 10 MG/ML IJ SOLN
INTRAMUSCULAR | Status: AC
  Filled 2015-03-03: qty 1

## 2015-03-03 MED ORDER — FENTANYL CITRATE (PF) 250 MCG/5ML IJ SOLN
INTRAMUSCULAR | Status: AC
  Filled 2015-03-03: qty 25

## 2015-03-03 MED ORDER — POLYETHYLENE GLYCOL 3350 17 G PO PACK: 17.0000 g | PACK | Freq: Every day | ORAL | Status: DC | PRN

## 2015-03-03 MED ORDER — KETOROLAC TROMETHAMINE 15 MG/ML IJ SOLN
7.5000 mg | Freq: Four times a day (QID) | INTRAMUSCULAR | Status: AC | PRN
  Administered 2015-03-03: 7.5 mg via INTRAVENOUS

## 2015-03-03 MED ORDER — PHENOL 1.4 % MT LIQD
1.0000 | OROMUCOSAL | Status: DC | PRN
Start: 2015-03-03 — End: 2015-03-04

## 2015-03-03 MED ORDER — PHENYLEPHRINE HCL 10 MG/ML IJ SOLN
INTRAMUSCULAR | Status: AC
  Filled 2015-03-03: qty 2

## 2015-03-03 MED ORDER — LACTATED RINGERS IV SOLN
INTRAVENOUS | Status: DC
  Administered 2015-03-03: 1000 mL via INTRAVENOUS
  Administered 2015-03-03 (×4): via INTRAVENOUS

## 2015-03-03 MED ORDER — DEXAMETHASONE SODIUM PHOSPHATE 10 MG/ML IJ SOLN
INTRAMUSCULAR | Status: AC
Start: 2015-03-03 — End: 2015-03-03
  Filled 2015-03-03: qty 2

## 2015-03-03 MED ORDER — SUCCINYLCHOLINE CHLORIDE 20 MG/ML IJ SOLN
INTRAMUSCULAR | Status: DC | PRN
  Administered 2015-03-03: 100 mg via INTRAVENOUS

## 2015-03-03 MED ORDER — EPHEDRINE SULFATE 50 MG/ML IJ SOLN
INTRAMUSCULAR | Status: DC | PRN
  Administered 2015-03-03 (×5): 10 mg via INTRAVENOUS

## 2015-03-03 MED ORDER — PHENYLEPHRINE 40 MCG/ML (10ML) SYRINGE FOR IV PUSH (FOR BLOOD PRESSURE SUPPORT)
PREFILLED_SYRINGE | INTRAVENOUS | Status: AC
  Filled 2015-03-03: qty 10

## 2015-03-03 MED ORDER — CARVEDILOL 6.25 MG PO TABS
6.2500 mg | ORAL_TABLET | Freq: Two times a day (BID) | ORAL | Status: DC
  Administered 2015-03-04: 6.25 mg via ORAL
  Filled 2015-03-03 (×3): qty 1

## 2015-03-03 MED ORDER — MIDAZOLAM HCL 5 MG/5ML IJ SOLN
INTRAMUSCULAR | Status: DC | PRN
  Administered 2015-03-03: 2 mg via INTRAVENOUS

## 2015-03-03 MED ORDER — ONDANSETRON HCL 4 MG/2ML IJ SOLN
INTRAMUSCULAR | Status: AC
  Filled 2015-03-03: qty 2

## 2015-03-03 MED ORDER — ACETAMINOPHEN 650 MG RE SUPP: 650.0000 mg | Freq: Four times a day (QID) | RECTAL | Status: DC | PRN

## 2015-03-03 MED ORDER — ACETAMINOPHEN 10 MG/ML IV SOLN
1000.0000 mg | Freq: Once | INTRAVENOUS | Status: AC
  Administered 2015-03-03: 1000 mg via INTRAVENOUS
  Filled 2015-03-03: qty 100

## 2015-03-03 MED ORDER — PHENYLEPHRINE HCL 10 MG/ML IJ SOLN
INTRAMUSCULAR | Status: DC | PRN
  Administered 2015-03-03 (×5): 80 ug via INTRAVENOUS

## 2015-03-03 MED ORDER — SUGAMMADEX SODIUM 500 MG/5ML IV SOLN
INTRAVENOUS | Status: AC
  Filled 2015-03-03: qty 5

## 2015-03-03 MED ORDER — LIDOCAINE HCL (CARDIAC) 20 MG/ML IV SOLN
INTRAVENOUS | Status: AC
  Filled 2015-03-03: qty 5

## 2015-03-03 MED ORDER — FEBUXOSTAT 40 MG PO TABS
80.0000 mg | ORAL_TABLET | Freq: Every day | ORAL | Status: DC
  Administered 2015-03-04: 80 mg via ORAL
  Filled 2015-03-03: qty 2

## 2015-03-03 MED ORDER — DEXAMETHASONE SODIUM PHOSPHATE 10 MG/ML IJ SOLN
10.0000 mg | Freq: Once | INTRAMUSCULAR | Status: AC
  Administered 2015-03-03: 10 mg via INTRAVENOUS

## 2015-03-03 MED ORDER — CEFAZOLIN SODIUM-DEXTROSE 2-3 GM-% IV SOLR
2.0000 g | INTRAVENOUS | Status: AC
  Administered 2015-03-03: 2 g via INTRAVENOUS

## 2015-03-03 MED ORDER — COLCHICINE 0.6 MG PO TABS
0.6000 mg | ORAL_TABLET | Freq: Two times a day (BID) | ORAL | Status: DC | PRN
  Filled 2015-03-03: qty 1

## 2015-03-03 MED ORDER — FLEET ENEMA 7-19 GM/118ML RE ENEM: 1.0000 | ENEMA | Freq: Once | RECTAL | Status: DC | PRN

## 2015-03-03 MED ORDER — ONDANSETRON HCL 4 MG/2ML IJ SOLN: 4.0000 mg | Freq: Four times a day (QID) | INTRAMUSCULAR | Status: DC | PRN

## 2015-03-03 SURGICAL SUPPLY — 67 items
BAG DECANTER FOR FLEXI CONT (MISCELLANEOUS) ×2 IMPLANT
BAG SPEC THK2 15X12 ZIP CLS (MISCELLANEOUS) ×3
BAG ZIPLOCK 12X15 (MISCELLANEOUS) ×6 IMPLANT
BIT DRILL 2.8X128 (BIT) ×2 IMPLANT
BLADE EXTENDED COATED 6.5IN (ELECTRODE) ×2 IMPLANT
BLADE SAW SAG 73X25 THK (BLADE) ×1
BLADE SAW SGTL 73X25 THK (BLADE) ×1 IMPLANT
BRUSH FEMORAL CANAL (MISCELLANEOUS) IMPLANT
DRAPE INCISE IOBAN 66X45 STRL (DRAPES) ×2 IMPLANT
DRAPE ORTHO SPLIT 77X108 STRL (DRAPES) ×4
DRAPE POUCH INSTRU U-SHP 10X18 (DRAPES) ×2 IMPLANT
DRAPE SURG ORHT 6 SPLT 77X108 (DRAPES) ×2 IMPLANT
DRAPE U-SHAPE 47X51 STRL (DRAPES) ×2 IMPLANT
DRSG ADAPTIC 3X8 NADH LF (GAUZE/BANDAGES/DRESSINGS) ×1 IMPLANT
DRSG EMULSION OIL 3X16 NADH (GAUZE/BANDAGES/DRESSINGS) ×1 IMPLANT
DRSG MEPILEX BORDER 4X4 (GAUZE/BANDAGES/DRESSINGS) ×4 IMPLANT
DRSG MEPILEX BORDER 4X8 (GAUZE/BANDAGES/DRESSINGS) ×2 IMPLANT
DURAPREP 26ML APPLICATOR (WOUND CARE) ×2 IMPLANT
ELECT REM PT RETURN 9FT ADLT (ELECTROSURGICAL) ×2
ELECTRODE REM PT RTRN 9FT ADLT (ELECTROSURGICAL) ×1 IMPLANT
EVACUATOR 1/8 PVC DRAIN (DRAIN) ×2 IMPLANT
FACESHIELD WRAPAROUND (MASK) ×8 IMPLANT
FACESHIELD WRAPAROUND OR TEAM (MASK) ×4 IMPLANT
GAUZE SPONGE 4X4 12PLY STRL (GAUZE/BANDAGES/DRESSINGS) ×2 IMPLANT
GLOVE BIO SURGEON STRL SZ7.5 (GLOVE) ×2 IMPLANT
GLOVE BIO SURGEON STRL SZ8 (GLOVE) ×2 IMPLANT
GLOVE BIOGEL PI IND STRL 8 (GLOVE) ×3 IMPLANT
GLOVE BIOGEL PI INDICATOR 8 (GLOVE) ×3
GLOVE SURG SS PI 6.5 STRL IVOR (GLOVE) ×4 IMPLANT
GOWN STRL REUS W/TWL LRG LVL3 (GOWN DISPOSABLE) ×4 IMPLANT
GOWN STRL REUS W/TWL XL LVL3 (GOWN DISPOSABLE) ×2 IMPLANT
HANDPIECE INTERPULSE COAX TIP (DISPOSABLE)
HEAD METAL ON METAL PLUS 6MM (Hips) ×1 IMPLANT
IMMOBILIZER KNEE 20 (SOFTGOODS)
IMMOBILIZER KNEE 20 THIGH 36 (SOFTGOODS) IMPLANT
KIT BASIN OR (CUSTOM PROCEDURE TRAY) ×2 IMPLANT
LINER MARATHON 4 NEUTRAL 36X56 (Hips) ×1 IMPLANT
MANIFOLD NEPTUNE II (INSTRUMENTS) ×2 IMPLANT
NDL SAFETY ECLIPSE 18X1.5 (NEEDLE) ×1 IMPLANT
NEEDLE HYPO 18GX1.5 SHARP (NEEDLE) ×2
NS IRRIG 1000ML POUR BTL (IV SOLUTION) ×2 IMPLANT
PACK TOTAL JOINT (CUSTOM PROCEDURE TRAY) ×2 IMPLANT
PADDING CAST COTTON 6X4 STRL (CAST SUPPLIES) ×1 IMPLANT
PASSER SUT SWANSON 36MM LOOP (INSTRUMENTS) ×2 IMPLANT
PEN SKIN MARKING BROAD (MISCELLANEOUS) ×2 IMPLANT
POSITIONER SURGICAL ARM (MISCELLANEOUS) ×2 IMPLANT
PRESSURIZER FEMORAL UNIV (MISCELLANEOUS) IMPLANT
SET HNDPC FAN SPRY TIP SCT (DISPOSABLE) IMPLANT
SPONGE LAP 18X18 X RAY DECT (DISPOSABLE) ×2 IMPLANT
STAPLER VISISTAT 35W (STAPLE) ×1 IMPLANT
SUCTION FRAZIER TIP 10 FR DISP (SUCTIONS) ×2 IMPLANT
SUT ETHIBOND NAB CT1 #1 30IN (SUTURE) ×4 IMPLANT
SUT VIC AB 1 CT1 27 (SUTURE) ×6
SUT VIC AB 1 CT1 27XBRD ANTBC (SUTURE) ×3 IMPLANT
SUT VIC AB 2-0 CT1 27 (SUTURE) ×6
SUT VIC AB 2-0 CT1 TAPERPNT 27 (SUTURE) ×3 IMPLANT
SUT VLOC 180 0 24IN GS25 (SUTURE) ×5 IMPLANT
SWAB COLLECTION DEVICE MRSA (MISCELLANEOUS) ×1 IMPLANT
SYR 50ML LL SCALE MARK (SYRINGE) ×2 IMPLANT
TOWEL OR 17X26 10 PK STRL BLUE (TOWEL DISPOSABLE) ×4 IMPLANT
TOWER CARTRIDGE SMART MIX (DISPOSABLE) IMPLANT
TRAY FOLEY W/METER SILVER 14FR (SET/KITS/TRAYS/PACK) ×1 IMPLANT
TRAY FOLEY W/METER SILVER 16FR (SET/KITS/TRAYS/PACK) ×2 IMPLANT
TUBE ANAEROBIC SPECIMEN COL (MISCELLANEOUS) IMPLANT
TUBE KAMVAC SUCTION (TUBING) IMPLANT
WATER STERILE IRR 1500ML POUR (IV SOLUTION) ×2 IMPLANT
YANKAUER SUCT BULB TIP 10FT TU (MISCELLANEOUS) ×2 IMPLANT

## 2015-03-03 NOTE — Interval H&P Note (Signed)
History and Physical Interval Note:  03/03/2015 5:04 PM  [name]  has presented today for surgery, with the diagnosis of RIGHT HIP METALLOSIS  The various methods of treatment have been discussed with the patient and family. After consideration of risks, benefits and other options for treatment, the patient has consented to  Procedure(s): RIGHT HIP BEARING SURFACE VS TOTAL HIP ARTHROPLASTY REVISION (Right) as a surgical intervention .  The patient's history has been reviewed, patient examined, no change in status, stable for surgery.  I have reviewed the patient's chart and labs.  Questions were answered to the patient's satisfaction.     Loanne Drilling

## 2015-03-03 NOTE — Transfer of Care (Signed)
Immediate Anesthesia Transfer of Care Note  Patient: [name]  Procedure(s) Performed: Procedure(s): RIGHT HIP BEARING SURFACE REVISION (Right)  Patient Location: PACU  Anesthesia Type:General  Level of Consciousness:  sedated, patient cooperative and responds to stimulation  Airway & Oxygen Therapy:Patient Spontanous Breathing and Patient connected to face mask oxgen  Post-op Assessment:  Report given to PACU RN and Post -op Vital signs reviewed and stable  Post vital signs:  Reviewed and stable  Last Vitals:  Filed Vitals:   03/03/15 1310  BP: 135/88  Pulse: 75  Temp: 36.4 C  Resp: 16    Complications: No apparent anesthesia complications

## 2015-03-03 NOTE — Brief Op Note (Signed)
03/03/2015  6:40 PM  PATIENT:  [name]  59 y.o. male  PRE-OPERATIVE DIAGNOSIS:  RIGHT HIP METALLOSIS  POST-OPERATIVE DIAGNOSIS:  RIGHT HIP METALLOSIS  PROCEDURE:  Procedure(s): RIGHT HIP BEARING SURFACE REVISION (Right)  SURGEON:  Surgeon(s) and Role:    * Ollen Gross, MD - Primary  PHYSICIAN ASSISTANT:   ASSISTANTS: Avel Peace, PA-C   ANESTHESIA:   general  EBL:  Total I/O In: 2600 [I.V.:2600] Out: 550 [Urine:450; Blood:100]  BLOOD ADMINISTERED:none  DRAINS: (Medium) Hemovact drain(s) in the right hip with  Suction Open   LOCAL MEDICATIONS USED:  NONE  SPECIMEN:  No Specimen  DISPOSITION OF SPECIMEN:  N/A  COUNTS:  YES  TOURNIQUET:  * No tourniquets in log *  DICTATION: .Other Dictation: Dictation Number [id number]  PLAN OF CARE: Admit to inpatient   PATIENT DISPOSITION:  PACU - hemodynamically stable.

## 2015-03-03 NOTE — Progress Notes (Signed)
Pt stated that he would self administer CPAP when ready for bed.  Current settings are Auto CPAP 8-20 CMH20 with 2 LPM O2 bleed in via Pt's home nasal pillows and tubing.  Pt to call if any assistance is required throughout the night.  RT to monitor and assess as needed.

## 2015-03-03 NOTE — Anesthesia Procedure Notes (Signed)
Procedure Name: Intubation Date/Time: 03/03/2015 5:15 PM Performed by: KEY, Nuala Alpha Pre-anesthesia Checklist: Patient identified, Emergency Drugs available, Suction available, Patient being monitored and Timeout performed Patient Re-evaluated:Patient Re-evaluated prior to inductionOxygen Delivery Method: Circle system utilized Preoxygenation: Pre-oxygenation with 100% oxygen Intubation Type: IV induction Ventilation: Mask ventilation without difficulty Laryngoscope Size: Mac and 4 Grade View: Grade II Tube type: Oral Tube size: 7.5 mm Number of attempts: 1 Airway Equipment and Method: Stylet Placement Confirmation: ETT inserted through vocal cords under direct vision,  positive ETCO2,  CO2 detector and breath sounds checked- equal and bilateral Secured at: 23 cm Tube secured with: Tape Dental Injury: Teeth and Oropharynx as per pre-operative assessment

## 2015-03-03 NOTE — Anesthesia Preprocedure Evaluation (Addendum)
Anesthesia Evaluation  Patient identified by MRN, date of birth, ID band Patient awake    Reviewed: Allergy & Precautions, NPO status , Patient's Chart, lab work & pertinent test results  History of Anesthesia Complications Negative for: history of anesthetic complications  Airway Mallampati: II  TM Distance: >3 FB Neck ROM: Full    Dental  (+) Teeth Intact, Dental Advisory Given   Pulmonary shortness of breath, sleep apnea ,    breath sounds clear to auscultation       Cardiovascular hypertension,  Rhythm:Regular Rate:Normal     Neuro/Psych negative neurological ROS     GI/Hepatic negative GI ROS, Neg liver ROS,   Endo/Other  negative endocrine ROSMorbid obesity  Renal/GU negative Renal ROS     Musculoskeletal  (+) Arthritis ,   Abdominal (+) + obese,   Peds  Hematology   Anesthesia Other Findings   Reproductive/Obstetrics                            Anesthesia Physical Anesthesia Plan  ASA: II  Anesthesia Plan: General   Post-op Pain Management:    Induction: Intravenous  Airway Management Planned:   Additional Equipment:   Intra-op Plan:   Post-operative Plan:   Informed Consent:   Plan Discussed with:   Anesthesia Plan Comments:         Anesthesia Quick Evaluation

## 2015-03-03 NOTE — H&P (View-Only) (Signed)
[name] DOB: 02/27/1956 Single / Language: English / Race: White Male Date of Admission:  03/03/2015 CC:  Right Hip Pain History of Present Illness The patient is a 58 year old male who comes in for a preoperative History and Physical. The patient is scheduled for a right hip bearing surface versus total hip revision to be performed by Dr. Frank V. Aluisio, MD at [facility] on 03-03-2015. The patient is a 58 year old male who presented for follow up of their hip. The patient is being followed for their right hip pain. Symptoms reported include: pain and pain with weightbearing. The patient reported their pain level to be 7 ou of 10. Current treatment includes: NSAIDs. The following medication has been used for pain control: antiinflammatory medication. The patient presented in follow up after an MRI. The patient has reported improvement of their symptoms with: conservative measures and NSAIDs. Ray continues with significant lateral hip pain. It is worse with weightbearing and in his rest. He had the MARS MRI scan and has had some blood work done to evaluate for the metal-on-metal hip. The scan did not show any fluid collections but there appeared to be some osteolysis around the greater trochanter. His blood work showed that his cobalt level has increased to 4.9 and chromium level remained normal at 2.1. It is felt that he would benefit from undergoing a bearing exchange versus total hip revision. They have been treated conservatively in the past for the above stated problem and despite conservative measures, they continue to have progressive pain and severe functional limitations and dysfunction. They have failed non-operative management including home exercise, medications. It is felt that they would benefit from undergoing hip bearing exchange versus total joint revision. Risks and benefits of the procedure have been discussed with the patient and they elect to proceed with surgery. There  are no active contraindications to surgery such as ongoing infection or rapidly progressive neurological disease.  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 Hypertension Father. Rheumatoid Arthritis Father.  Social History Exercise Exercises weekly; does gym / weights Children 0 Marital status single Living situation live alone Current work status working full time Most recent primary occupation REstaurant owner Never consumed alcohol 08/14/2013: Never consumed alcohol No history of drug/alcohol rehab Not under pain contract Number of flights of stairs before winded greater than 5 Tobacco / smoke exposure 08/14/2013: no Tobacco use Never smoker. 08/14/2013  Medication History Aleve (220MG Tablet, Oral) Active. Uloric (80MG Tablet, Oral) Active. Zolpidem Tartrate (10MG Tablet, Oral) Active. Crestor (20MG Tablet, Oral) Active. Lisinopril (20MG Tablet, Oral) Active. Tamsulosin HCl (0.4MG Capsule, Oral) Active.  Past Surgical History Total Hip Replacement bilateral  Review of Systems  General Not Present- Chills, Fatigue, Fever, Memory Loss, Night Sweats, Weight Gain and Weight Loss. Skin Not Present- Eczema, Hives, Itching, Lesions and Rash. HEENT Not Present- Dentures, Double Vision, Headache, Hearing Loss, Tinnitus and Visual Loss. Respiratory Not Present- Allergies, Chronic Cough, Coughing up blood, Shortness of breath at rest and Shortness of breath with exertion. Cardiovascular Not Present- Chest Pain, Difficulty Breathing Lying Down, Murmur, Palpitations, Racing/skipping heartbeats and Swelling. Gastrointestinal Not Present- Abdominal Pain, Bloody Stool, Constipation, Diarrhea, Difficulty Swallowing,  Heartburn, Jaundice, Loss of appetitie, Nausea and Vomiting. Male Genitourinary Not Present- Blood in Urine, Discharge, Flank Pain, Incontinence,   Painful Urination, Urgency, Urinary frequency, Urinary Retention, Urinating at Night and Weak urinary stream. Musculoskeletal Present- Joint Pain (Hip pain). Not Present- Back Pain, Joint Swelling, Morning Stiffness, Muscle Pain, Muscle Weakness and Spasms. Neurological Not Present- Blackout spells, Difficulty with balance, Dizziness, Paralysis, Tremor and Weakness. Psychiatric Not Present- Insomnia.  Vitals Weight: 245 lb Height: 66in Body Surface Area: 2.18 m Body Mass Index: 39.54 kg/m  BP: 146/82 (Sitting, Right Arm, Standard)  Physical Exam General Mental Status -Alert, cooperative and good historian. General Appearance-pleasant, Not in acute distress. Orientation-Oriented X3. Build & Nutrition-Well nourished and Well developed.  Head and Neck Head-normocephalic, atraumatic . Neck Global Assessment - supple, no bruit auscultated on the right, no bruit auscultated on the left.  Eye Pupil - Bilateral-Regular and Round. Motion - Bilateral-EOMI.  Chest and Lung Exam Auscultation Breath sounds - clear at anterior chest wall and clear at posterior chest wall. Adventitious sounds - No Adventitious sounds.  Cardiovascular Auscultation Rhythm - Regular rate and rhythm. Heart Sounds - S1 WNL and S2 WNL. Murmurs & Other Heart Sounds - Auscultation of the heart reveals - No Murmurs.  Abdomen Palpation/Percussion Tenderness - Abdomen is non-tender to palpation. Rigidity (guarding) - Abdomen is soft. Auscultation Auscultation of the abdomen reveals - Bowel sounds normal.  Male Genitourinary Note: Not done, not pertinent to present illness  Musculoskeletal Note: Well-developed male in no distress. His right hip can be flexed to about 110, rotate in 20 and out 30, and abduct 30 without discomfort on range of motion  but with discomfort to palpation at the greater trochanter.  IMAGING AND STUDIES MRI scan. No fluid collections, but appears to be some osteolysis around the greater trochanter. His blood work shows that his cobalt level is now increased to 4.9 and chromium level remains normal at 2.1.  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 Ludwig Clarks  IV TXA  Anesthesia Issues: Postop Nausea in the past.  Signed electronically by Lauraine Rinne, III PA-C

## 2015-03-04 ENCOUNTER — Encounter (HOSPITAL_COMMUNITY): Payer: Self-pay | Admitting: Orthopedic Surgery

## 2015-03-04 LAB — BASIC METABOLIC PANEL
ANION GAP: 6 (ref 5–15)
BUN: 17 mg/dL (ref 6–20)
CO2: 26 mmol/L (ref 22–32)
Calcium: 8.6 mg/dL — ABNORMAL LOW (ref 8.9–10.3)
Chloride: 103 mmol/L (ref 101–111)
Creatinine, Ser: 0.83 mg/dL (ref 0.61–1.24)
GFR calc Af Amer: >60 mL/min (ref >60)
GFR calc non Af Amer: >60 mL/min (ref >60)
GLUCOSE: 151 mg/dL — AB (ref 65–99)
POTASSIUM: 4.7 mmol/L (ref 3.5–5.1)
Sodium: 135 mmol/L (ref 135–145)

## 2015-03-04 LAB — CBC
HEMATOCRIT: 44 % (ref 39.0–52.0)
Hemoglobin: 14.6 g/dL (ref 13.0–17.0)
MCH: 28.5 pg (ref 26.0–34.0)
MCHC: 33.2 g/dL (ref 30.0–36.0)
MCV: 85.8 fL (ref 78.0–100.0)
Platelets: 187 10*3/uL (ref 150–400)
RBC: 5.13 MIL/uL (ref 4.22–5.81)
RDW: 14.9 % (ref 11.5–15.5)
WBC: 11.9 10*3/uL — AB (ref 4.0–10.5)

## 2015-03-04 MED ORDER — OXYCODONE HCL 5 MG PO TABS: 5.0000 mg | ORAL_TABLET | ORAL | Status: DC | PRN

## 2015-03-04 MED ORDER — INFLUENZA VAC SPLIT QUAD 0.5 ML IM SUSY: 0.5000 mL | PREFILLED_SYRINGE | INTRAMUSCULAR | Status: DC

## 2015-03-04 MED ORDER — TRAMADOL HCL 50 MG PO TABS: 50.0000 mg | ORAL_TABLET | Freq: Four times a day (QID) | ORAL | Status: DC | PRN

## 2015-03-04 MED ORDER — METHOCARBAMOL 500 MG PO TABS: 500.0000 mg | ORAL_TABLET | Freq: Four times a day (QID) | ORAL | Status: DC | PRN

## 2015-03-04 MED ORDER — ASPIRIN 325 MG PO TBEC: 325.0000 mg | DELAYED_RELEASE_TABLET | Freq: Every day | ORAL | Status: DC

## 2015-03-04 NOTE — Care Management Note (Signed)
Case Management Note  Patient Details  Name: [name] MRN: [id number] Date of Birth: 04/19/1956  Subjective/Objective:                   Right hip bearing surface revision. Action/Plan:  Discharge planning Expected Discharge Date:  03/04/15               Expected Discharge Plan:  [place]  In-House Referral:     Discharge planning Services  CM Consult  Post Acute Care Choice:  Home Health Choice offered to:  Patient  DME Arranged:  3-N-1, Walker rolling DME Agency:  [place]:  PT [place]:  [place]  Status of Service:  Completed, signed off  Medicare Important Message Given:    Date Medicare IM Given:    Medicare IM give by:    Date Additional Medicare IM Given:    Additional Medicare Important Message give by:     If discussed at [place] of Stay Meetings, dates discussed:    Additional Comments: CM met with pt in room to offer choice of home health agency.  Pt chooses [facility] to render HHPT.  Address and contact information verified by pt.  Referral emailed to [employer], Tim.  CM called [facility] DME rep, Lecretia to please deliver the 3n1 and rolling walker to room prior to discharge today.  No other CM needs were communicated. Dellie Catholic, RN 03/04/2015, 9:38 AM

## 2015-03-04 NOTE — Discharge Instructions (Signed)
Dr. Gaynelle Arabian Total Joint Specialist [facility] [address] [place], [place] [address] [phone]   POSTERIOR TOTAL HIP REPLACEMENT POSTOPERATIVE DIRECTIONS  Hip Rehabilitation, Guidelines Following Surgery  The results of a hip operation are greatly improved after range of motion and muscle strengthening exercises. Follow all safety measures which are given to protect your hip. If any of these exercises cause increased pain or swelling in your joint, decrease the amount until you are comfortable again. Then slowly increase the exercises. Call your caregiver if you have problems or questions.   HOME CARE INSTRUCTIONS  Remove items at home which could result in a fall. This includes throw rugs or furniture in walking pathways.   ICE to the affected hip every three hours for 30 minutes at a time and then as needed for pain and swelling.  Continue to use ice on the hip for pain and swelling from surgery. You may notice swelling that will progress down to the foot and ankle.  This is normal after surgery.  Elevate the leg when you are not up walking on it.    Continue to use the breathing machine which will help keep your temperature down.  It is common for your temperature to cycle up and down following surgery, especially at night when you are not up moving around and exerting yourself.  The breathing machine keeps your lungs expanded and your temperature down.  DIET You may resume your previous home diet once your are discharged from the hospital.  DRESSING / WOUND CARE / SHOWERING You may shower 3 days after surgery, but keep the wounds dry during showering.  You may use an occlusive plastic wrap (Press'n Seal for example), NO SOAKING/SUBMERGING IN THE BATHTUB.  If the bandage gets wet, change with a clean dry gauze.  If the incision gets wet, pat the wound dry with a clean towel. You may start showering once you are discharged home but do not submerge  the incision under water. Just pat the incision dry and apply a dry gauze dressing on daily. Change the surgical dressing daily and reapply a dry dressing each time.    ACTIVITY Walk with your walker as instructed. Use walker as long as suggested by your caregivers. Avoid periods of inactivity such as sitting longer than an hour when not asleep. This helps prevent blood clots.  You may resume a sexual relationship in one month or when given the OK by your doctor.  You may return to work once you are cleared by your doctor.  Do not drive a car for 6 weeks or until released by you surgeon.  Do not drive while taking narcotics.  WEIGHT BEARING Weight bearing as tolerated with assist device (walker, cane, etc) as directed, use it as long as suggested by your surgeon or therapist, typically at least 4-6 weeks.  POSTOPERATIVE CONSTIPATION PROTOCOL Constipation - defined medically as fewer than three stools per week and severe constipation as less than one stool per week.  One of the most common issues patients have following surgery is constipation.  Even if you have a regular bowel pattern at home, your normal regimen is likely to be disrupted due to multiple reasons following surgery.  Combination of anesthesia, postoperative narcotics, change in appetite and fluid intake all can affect your bowels.  In order to avoid complications following surgery, here are some recommendations in order to help you during your recovery period.  Colace (docusate) - Pick up an over-the-counter  form of Colace or another stool softener and take twice a day as long as you are requiring postoperative pain medications.  Take with a full glass of water daily.  If you experience loose stools or diarrhea, hold the colace until you stool forms back up.  If your symptoms do not get better within 1 week or if they get worse, check with your doctor. ° °Dulcolax (bisacodyl) - Pick up over-the-counter and take as directed by the  product packaging as needed to assist with the movement of your bowels.  Take with a full glass of water.  Use this product as needed if not relieved by Colace only.  ° °MiraLax (polyethylene glycol) - Pick up over-the-counter to have on hand.  MiraLax is a solution that will increase the amount of water in your bowels to assist with bowel movements.  Take as directed and can mix with a glass of water, juice, soda, coffee, or tea.  Take if you go more than two days without a movement. °Do not use MiraLax more than once per day. Call your doctor if you are still constipated or irregular after using this medication for 7 days in a row. ° °If you continue to have problems with postoperative constipation, please contact the office for further assistance and recommendations.  If you experience "the worst abdominal pain ever" or develop nausea or vomiting, please contact the office immediatly for further recommendations for treatment. ° °ITCHING ° If you experience itching with your medications, try taking only a single pain pill, or even half a pain pill at a time.  You can also use Benadryl over the counter for itching or also to help with sleep.  ° °TED HOSE STOCKINGS °Wear the elastic stockings on both legs for three weeks following surgery during the day but you may remove then at night for sleeping. ° °MEDICATIONS °See your medication summary on the “After Visit Summary” that the nursing staff will review with you prior to discharge.  You may have some home medications which will be placed on hold until you complete the course of blood thinner medication.  It is important for you to complete the blood thinner medication as prescribed by your surgeon.  Continue your approved medications as instructed at time of discharge. ° °PRECAUTIONS °If you experience chest pain or shortness of breath - call 911 immediately for transfer to the hospital emergency department.  °If you develop a fever greater that 101 F, purulent  drainage from wound, increased redness or drainage from wound, foul odor from the wound/dressing, or calf pain - CONTACT YOUR SURGEON.   °                                                °FOLLOW-UP APPOINTMENTS °Make sure you keep all of your appointments after your operation with your surgeon and caregivers. You should call the office at the above phone number and make an appointment for approximately two weeks after the date of your surgery or on the date instructed by your surgeon outlined in the "After Visit Summary". ° °RANGE OF MOTION AND STRENGTHENING EXERCISES  °These exercises are designed to help you keep full movement of your hip joint. Follow your caregiver's or physical therapist's instructions. Perform all exercises about fifteen times, three times per day or as directed. Exercise both hips, even if you   have had only one joint replacement. These exercises can be done on a training (exercise) mat, on the floor, on a table or on a bed. Use whatever works the best and is most comfortable for you. Use music or television while you are exercising so that the exercises are a pleasant break in your day. This will make your life better with the exercises acting as a break in routine you can look forward to.  Lying on your back, slowly slide your foot toward your buttocks, raising your knee up off the floor. Then slowly slide your foot back down until your leg is straight again.  Lying on your back spread your legs as far apart as you can without causing discomfort.  Lying on your side, raise your upper leg and foot straight up from the floor as far as is comfortable. Slowly lower the leg and repeat.  Lying on your back, tighten up the muscle in the front of your thigh (quadriceps muscles). You can do this by keeping your leg straight and trying to raise your heel off the floor. This helps strengthen the largest muscle supporting your knee.  Lying on your back, tighten up the muscles of your buttocks both  with the legs straight and with the knee bent at a comfortable angle while keeping your heel on the floor.      IF YOU ARE TRANSFERRED TO A SKILLED REHAB FACILITY If the patient is transferred to a skilled rehab facility following release from the hospital, a list of the current medications will be sent to the facility for the patient to continue.  When discharged from the skilled rehab facility, please have the facility set up the patient's Home Health Physical Therapy prior to being released. Also, the skilled facility will be responsible for providing the patient with their medications at time of release from the facility to include their pain medication, the muscle relaxants, and their blood thinner medication. If the patient is still at the rehab facility at time of the two week follow up appointment, the skilled rehab facility will also need to assist the patient in arranging follow up appointment in our office and any transportation needs.  MAKE SURE YOU:  Understand these instructions.  Get help right away if you are not doing well or get worse.    Pick up stool softner and laxative for home use following surgery while on pain medications. Do not submerge incision under water. May remove the surgical dressing tomorrow, Friday 03/05/2015, and then apply a dry gauze dressing daily. Please use good hand washing techniques while changing dressing each day. May shower starting three days after surgery starting Saturday 03/06/2015. Please use a clean towel to pat the incision dry following showers. Continue to use ice for pain and swelling after surgery. Do not use any lotions or creams on the incision until instructed by your surgeon.  Take a 325 mg Aspirin daily for three weeks and then reduce to a baby Aspirin 81 mg daily for three additional weeks.

## 2015-03-04 NOTE — Evaluation (Signed)
Physical Therapy Evaluation Patient Details Name: [name] MRN: [id number] DOB: 1955/11/09 Today's Date: 03/04/2015   History of Present Illness  59 yo male s/p R hip bearing surface revision 03/03/15. Hx of R THA-posterior 2009  Clinical Impression  On eval, pt required Min assist for mobility-walked ~125 feet with RW. Educated pt on posterior hip precautions. Recommend HHPT    Follow Up Recommendations Home health PT    Equipment Recommendations  Rolling walker with 5" wheels    Recommendations for Other Services OT consult     Precautions / Restrictions Precautions Precautions: Fall;Posterior Hip Precaution Comments: hx of posterior R THA so still educating pt on precautions Restrictions Weight Bearing Restrictions: No RLE Weight Bearing: Weight bearing as tolerated      Mobility  Bed Mobility Overal bed mobility: Needs Assistance Bed Mobility: Supine to Sit     Supine to sit: Min assist     General bed mobility comments: Assist for R LE. VCs safety, technique, adherence to precautions.   Transfers Overall transfer level: Needs assistance Equipment used: Rolling walker (2 wheeled) Transfers: Sit to/from Stand Sit to Stand: Min assist         General transfer comment: Assist to rise, stabilize, control descent. VCS safety, technique, hand placement.   Ambulation/Gait Ambulation/Gait assistance: Min assist Ambulation Distance (Feet): 125 Feet Assistive device: Rolling walker (2 wheeled) Gait Pattern/deviations: Step-to pattern;Trunk flexed;Antalgic     General Gait Details: Assist to stabilize. VCs safety, sequence. Pt moves quickly  Stairs            Wheelchair Mobility    Modified Rankin (Stroke Patients Only)       Balance Overall balance assessment: Needs assistance         Standing balance support: During functional activity Standing balance-Leahy Scale: Fair                               Pertinent Vitals/Pain  Pain Assessment: 0-10 Pain Score: 5  Pain Location: R hip with activity Pain Descriptors / Indicators: Sore Pain Intervention(s): Monitored during session;Ice applied;Repositioned    Home Living Family/patient expects to be discharged to:: Private residence Living Arrangements: Spouse/significant other Available Help at Discharge: Home health Type of Home: House Home Access: Stairs to enter Entrance Stairs-Rails: Doctor, general practice of Steps: 4 Home Layout: Two level;Able to live on main level with bedroom/bathroom Home Equipment: None      Prior Function Level of Independence: Independent               Hand Dominance        Extremity/Trunk Assessment   Upper Extremity Assessment: Defer to OT evaluation           Lower Extremity Assessment: RLE deficits/detail RLE Deficits / Details: hip flex at least 2/5, hip abd/add at least 2/5, moves ankle well    Cervical / Trunk Assessment: Normal  Communication   Communication: No difficulties  Cognition Arousal/Alertness: Awake/alert Behavior During Therapy: WFL for tasks assessed/performed Overall Cognitive Status: Within Functional Limits for tasks assessed                      General Comments      Exercises        Assessment/Plan    PT Assessment Patient needs continued PT services  PT Diagnosis Difficulty walking;Acute pain   PT Problem List Decreased strength;Decreased range of motion;Decreased activity tolerance;Decreased balance;Decreased mobility;Decreased  knowledge of use of DME;Pain;Decreased knowledge of precautions  PT Treatment Interventions DME instruction;Gait training;Stair training;Functional mobility training;Patient/family education;Balance training;Therapeutic exercise   PT Goals (Current goals can be found in the Care Plan section) Acute Rehab PT Goals Patient Stated Goal: home. return to PLOF PT Goal Formulation: With patient Time For Goal Achievement:  03/11/15 Potential to Achieve Goals: Good    Frequency 7X/week   Barriers to discharge        Co-evaluation               End of Session Equipment Utilized During Treatment: Gait belt Activity Tolerance: Patient tolerated treatment well;Patient limited by fatigue;Patient limited by pain Patient left: in chair;with call bell/phone within reach           Time: 0932-0954 PT Time Calculation (min) (ACUTE ONLY): 22 min   Charges:   PT Evaluation $Initial PT Evaluation Tier I: 1 Procedure     PT G Codes:        Rebeca Alert, MPT Pager: [phone]

## 2015-03-04 NOTE — Op Note (Signed)
NAMEAKHARI, WASHKO NO.:  [id number]  MEDICAL RECORD NO.:  [id number]  LOCATION:  1617                         FACILITY:  [facility]  PHYSICIAN:  Ollen Gross, M.D.    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  PROCEDURE:  Right hip bearing surface revision.  SURGEON:  Ollen Gross, M.D.  ASSISTANT:  Patrica Duel, PA-C  ANESTHESIA:  General.  ESTIMATED BLOOD LOSS:  100 mL.  DRAINS:  Hemovac x1.  COMPLICATIONS:  None.  CONDITION:  Stable to recovery.  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  He has had a persistent abductor limp and has had increasing bilateral hip pain for the past year.  He had metal levels of cobalt and chromium drawn, which were elevated.  We followed with a MARS MRI scan, which showed a fluid collection adjacent to the hip.  This was concerning for metallosis.  He presents now for bearing surface versus total hip arthroplasty revision.  PROCEDURE IN DETAIL:  After successful administration of general anesthetic, the patient was placed in the left lateral decubitus position with the right side up and held with the hip positioner.  Right lower extremity was isolated from his perineum with plastic drapes and prepped and draped in the usual sterile fashion.  His previous posterolateral incision was utilized.  Skin cut with a 10 blade through subcutaneous tissues to the fascia lata, which was incised in line with the skin incision.  We did not find any fluid at this point.  The sciatic nerve was palpated and protected and then the posterior pseudocapsule was resected off the femur with electrocautery.  There  was fluid in the joint, which appeared to be metal stained fluid.  It was sent for Gram stain, culture, and sensitivity.  Fortunately, there was no evidence of any muscle damage or tissue damage.  Resected any thickened capsule and then we dislocated the hip.  I removed the femoral head, which was a 36+ 6 cobalt chrome head.  The femoral prosthesis is in excellent position and well fixed.  There was a small amount of corrosion on the neck.  I removed that using the electrocautery scratch pad.  The overall neck looked good and the trunnion did not show any damage.  Given that this was an S-ROM stem, I felt we could potentially extracted, placed a new stem and we were able to use a ceramic head. Attempted to extract the stem, but it was cold welded to the sleeve and when would not budge.  I felt we could leave this intact and just use a new metal head.  There was a tiny bit of osteolysis in the greater trochanter that was removed.  The structure of the trochanter was fine.  I then retracted the femur anteriorly to gain acetabular exposure.  The acetabular liner was removed from the acetabular shell.  The  shell itself was in excellent position and there was no evidence of any damage to the acetabular shell.  We thoroughly irrigated the area and then removed the metal liner.  Once again, the backside of the metal liner as well as the articular side of the acetabular shell had no damage.  We thoroughly irrigated the shell and then placed a 36 mm neutral +4 marathon liner into the acetabular shell.  This was re-size 56.  I then inspected the femoral neck once again and although he was in good condition, I did not feel comfortable placing a new ceramic ball on there.  I placed a new 36+ 6 cobalt chromium head.  I felt it was safe to do this.  I did not have any kind of tissue damage or bone damage from the previous process.  We reduced the hip and had excellent stability with full extension,  full external rotation, 70 degrees flexion, 40 degrees adduction, 90 degrees internal rotation, 90 degrees of flexion, and 70 degrees of internal rotation.  By placing the right leg on top of the left, the leg lengths were equal.  We thoroughly irrigated again and then reattached the pseudocapsule to the femur through drill holes with Ethibond suture.  Fascia lata was closed over Hemovac drain with a running #1 V-Loc suture.  Deep subcu was closed with an additional running V-Loc suture.  Subcu was closed interrupted 2- 0 Vicryl and subcuticular running 4-0 Monocryl.  Drain was hooked to suction.  Incision cleaned and dried and Steri-Strips and a bulky sterile dressing applied.  He was placed into a knee immobilizer, awakened and transported to recovery in stable condition.     Ollen Gross, M.D.     FA/MEDQ  D:  03/03/2015  T:  03/04/2015  Job:  [id number]

## 2015-03-04 NOTE — Discharge Summary (Signed)
Physician Discharge Summary   Patient ID: [name] MRN: [id number] DOB/AGE: [birth year] 59 y.o.  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 Admission Diagnoses:  Past Medical History  Diagnosis Date  . GOUT   . EXOGENOUS OBESITY   . ARTHRITIS   . CHONDROMALACIA PATELLA, LEFT   . HIP REPLACEMENT, BILATERAL, HX OF   . SOB (shortness of breath)   . Obstructive apnea     patient could not tolerate CPAP due to nasal airflow restriction.   . Hypertension   . Arthritis    Discharge Diagnoses:   Principal Problem:   Failed total hip arthroplasty  Estimated body mass index is 38.21 kg/(m^2) as calculated from the following:   Height as of this encounter: 5' 7"  (1.702 m).   Weight as of this encounter: 110.678 kg (244 lb).  Procedure(s) (LRB): RIGHT HIP BEARING SURFACE REVISION (Right)   Consults: None  HPI: [name] is a 59 year old male, had a metal on metal right total hip arthroplasty done several years ago. He has had a persistent abductor limp and has had increasing bilateral hip pain for the past year. He had metal levels of cobalt and chromium drawn, which were elevated. We followed with a MARS MRI scan, which showed a fluid collection adjacent to the hip. This was concerning for metallosis. He presents now for bearing surface versus total hip arthroplasty revision.  Laboratory Data: Admission on 03/03/2015  Component Date Value Ref Range Status  . Specimen Description 03/03/2015 SYNOVIAL RIGHT HIP   Final  . Special Requests 03/03/2015 FLUID ON SWAB   Final  . Gram Stain 03/03/2015    Final                   Value:RARE WBC PRESENT, PREDOMINANTLY MONONUCLEAR NO ORGANISMS SEEN Performed at [facility]   . Culture 03/03/2015 PENDING   Incomplete  . Report Status 03/03/2015 PENDING   Incomplete  . WBC 03/04/2015 11.9* 4.0 - 10.5 K/uL Final  . RBC 03/04/2015 5.13  4.22 - 5.81  MIL/uL Final  . Hemoglobin 03/04/2015 14.6  13.0 - 17.0 g/dL Final  . HCT 03/04/2015 44.0  39.0 - 52.0 % Final  . MCV 03/04/2015 85.8  78.0 - 100.0 fL Final  . MCH 03/04/2015 28.5  26.0 - 34.0 pg Final  . MCHC 03/04/2015 33.2  30.0 - 36.0 g/dL Final  . RDW 03/04/2015 14.9  11.5 - 15.5 % Final  . Platelets 03/04/2015 187  150 - 400 K/uL Final  . Sodium 03/04/2015 135  135 - 145 mmol/L Final  . Potassium 03/04/2015 4.7  3.5 - 5.1 mmol/L Final  . Chloride 03/04/2015 103  101 - 111 mmol/L Final  . CO2 03/04/2015 26  22 - 32 mmol/L Final  . Glucose, Bld 03/04/2015 151* 65 - 99 mg/dL Final  . BUN 03/04/2015 17  6 - 20 mg/dL Final  . Creatinine, Ser 03/04/2015 0.83  0.61 - 1.24 mg/dL Final  . Calcium 03/04/2015 8.6* 8.9 - 10.3 mg/dL Final  . GFR calc non Af Amer 03/04/2015 >60  >60 mL/min Final  . GFR calc Af Amer 03/04/2015 >60  >60 mL/min Final   Comment: (NOTE) The eGFR has been calculated using the CKD EPI equation. This calculation has not been validated in all clinical situations. eGFR's persistently <60 mL/min signify possible Chronic Kidney Disease.   Georgiann Hahn gap 03/04/2015 6  5 - 15 Final  Hospital  Outpatient Visit on 02/22/2015  Component Date Value Ref Range Status  . aPTT 02/22/2015 29  24 - 37 seconds Final  . WBC 02/22/2015 11.1* 4.0 - 10.5 K/uL Final  . RBC 02/22/2015 5.70  4.22 - 5.81 MIL/uL Final  . Hemoglobin 02/22/2015 16.5  13.0 - 17.0 g/dL Final  . HCT 02/22/2015 49.5  39.0 - 52.0 % Final  . MCV 02/22/2015 86.8  78.0 - 100.0 fL Final  . MCH 02/22/2015 28.9  26.0 - 34.0 pg Final  . MCHC 02/22/2015 33.3  30.0 - 36.0 g/dL Final  . RDW 02/22/2015 15.0  11.5 - 15.5 % Final  . Platelets 02/22/2015 201  150 - 400 K/uL Final  . Sodium 02/22/2015 133* 135 - 145 mmol/L Final  . Potassium 02/22/2015 4.2  3.5 - 5.1 mmol/L Final  . Chloride 02/22/2015 101  101 - 111 mmol/L Final  . CO2 02/22/2015 24  22 - 32 mmol/L Final  . Glucose, Bld 02/22/2015 90  65 - 99 mg/dL Final  .  BUN 02/22/2015 27* 6 - 20 mg/dL Final  . Creatinine, Ser 02/22/2015 0.87  0.61 - 1.24 mg/dL Final  . Calcium 02/22/2015 8.9  8.9 - 10.3 mg/dL Final  . Total Protein 02/22/2015 7.5  6.5 - 8.1 g/dL Final  . Albumin 02/22/2015 4.4  3.5 - 5.0 g/dL Final  . AST 02/22/2015 39  15 - 41 U/L Final  . ALT 02/22/2015 46  17 - 63 U/L Final  . Alkaline Phosphatase 02/22/2015 61  38 - 126 U/L Final  . Total Bilirubin 02/22/2015 0.7  0.3 - 1.2 mg/dL Final  . GFR calc non Af Amer 02/22/2015 >60  >60 mL/min Final  . GFR calc Af Amer 02/22/2015 >60  >60 mL/min Final   Comment: (NOTE) The eGFR has been calculated using the CKD EPI equation. This calculation has not been validated in all clinical situations. eGFR's persistently <60 mL/min signify possible Chronic Kidney Disease.   . Anion gap 02/22/2015 8  5 - 15 Final  . Prothrombin Time 02/22/2015 13.9  11.6 - 15.2 seconds Final  . INR 02/22/2015 1.05  0.00 - 1.49 Final  . ABO/RH(D) 02/22/2015 AB POS   Final  . Antibody Screen 02/22/2015 NEG   Final  . Sample Expiration 02/22/2015 03/06/2015   Final  . Color, Urine 02/22/2015 YELLOW  YELLOW Final  . APPearance 02/22/2015 CLEAR  CLEAR Final  . Specific Gravity, Urine 02/22/2015 1.016  1.005 - 1.030 Final  . pH 02/22/2015 5.5  5.0 - 8.0 Final  . Glucose, UA 02/22/2015 NEGATIVE  NEGATIVE mg/dL Final  . Hgb urine dipstick 02/22/2015 TRACE* NEGATIVE Final  . Bilirubin Urine 02/22/2015 NEGATIVE  NEGATIVE Final  . Ketones, ur 02/22/2015 NEGATIVE  NEGATIVE mg/dL Final  . Protein, ur 02/22/2015 NEGATIVE  NEGATIVE mg/dL Final  . Urobilinogen, UA 02/22/2015 0.2  0.0 - 1.0 mg/dL Final  . Nitrite 02/22/2015 NEGATIVE  NEGATIVE Final  . Leukocytes, UA 02/22/2015 NEGATIVE  NEGATIVE Final  . MRSA, PCR 02/22/2015 NEGATIVE  NEGATIVE Final  . Staphylococcus aureus 02/22/2015 NEGATIVE  NEGATIVE Final   Comment:        The Xpert SA Assay (FDA approved for NASAL specimens in patients over 8 years of age), is one  component of a comprehensive surveillance program.  Test performance has been validated by [facility] for patients greater than or equal to 62 year old. It is not intended to diagnose infection nor to guide or monitor treatment.   Marland Kitchen  RBC / HPF 02/22/2015 0-2  <3 RBC/hpf Final     X-Rays:Dg Pelvis Portable  03/03/2015   CLINICAL DATA:  Postop from revision of right hip arthroplasty.  EXAM: PORTABLE PELVIS 1-2 VIEWS  COMPARISON:  None.  FINDINGS: Bipolar right hip prosthesis seen in expected position. Overlying surgical drain seen. No evidence of fracture or dislocation. Left hip prosthesis also noted in appropriate position.  IMPRESSION: Expected postoperative appearance of bipolar right hip prosthesis. No evidence of fracture or dislocation.   Electronically Signed   By: Earle Gell M.D.   On: 03/03/2015 19:45    EKG: Orders placed or performed during the hospital encounter of 02/22/15  . EKG 12-Lead  . EKG 12-Lead     Hospital Course: Patient was admitted to [facility] and taken to the OR and underwent the above state procedure without complications.  Patient tolerated the procedure well and was later transferred to the recovery room and then to the orthopaedic floor for postoperative care.  They were given PO and IV analgesics for pain control following their surgery.  They were given 24 hours of postoperative antibiotics of  Anti-infectives    Start     Dose/Rate Route Frequency Ordered Stop   03/04/15 0600  ceFAZolin (ANCEF) IVPB 2 g/50 mL premix     2 g 100 mL/hr over 30 Minutes Intravenous On call to O.R. 03/03/15 1328 03/03/15 1715   03/03/15 2330  ceFAZolin (ANCEF) IVPB 2 g/50 mL premix     2 g 100 mL/hr over 30 Minutes Intravenous Every 6 hours 03/03/15 2108 03/04/15 1129     and started on DVT prophylaxis in the form of Aspirin.   PT and OT were ordered for total hip protocol.  The patient was allowed to be WBAT with therapy. Discharge planning was consulted to  help with postop disposition and equipment needs.  Patient had a good night on the evening of surgery.  They started to get up OOB with therapy on day one.  Hemovac drain was pulled without difficulty.  The knee immobilizer was removed and discontinued.  Patient was seen in rounds by Dr. Dorcas Carrow and it was felt as long as he did well with therapy that he would be ready to go home.   Diet: Cardiac diet Activity:WBAT No bending hip over 90 degrees- A "L" Angle Do not cross legs Do not let foot roll inward When turning these patients a pillow should be placed between the patient's legs to prevent crossing. Patients should have the affected knee fully extended when trying to sit or stand from all surfaces to prevent excessive hip flexion. When ambulating and turning toward the affected side the affected leg should have the toes turned out prior to moving the walker and the rest of patient's body as to prevent internal rotation/ turning in of the leg. Abduction pillows are the most effective way to prevent a patient from not crossing legs or turning toes in at rest. If an abduction pillow is not ordered placing a regular pillow length wise between the patient's legs is also an effective reminder. It is imperative that these precautions be maintained so that the surgical hip does not dislocate. Follow-up:in 2 weeks Disposition - Home Discharged Condition: improving   Discharge Instructions    Call MD / Call 911    Complete by:  As directed   If you experience chest pain or shortness of breath, CALL 911 and be transported to the hospital emergency room.  If  you develope a fever above 101 F, pus (white drainage) or increased drainage or redness at the wound, or calf pain, call your surgeon's office.     Change dressing    Complete by:  As directed   You may change your dressing dressing daily with sterile 4 x 4 inch gauze dressing and paper tape.  Do not submerge the incision under water.      Constipation Prevention    Complete by:  As directed   Drink plenty of fluids.  Prune juice may be helpful.  You may use a stool softener, such as Colace (over the counter) 100 mg twice a day.  Use MiraLax (over the counter) for constipation as needed.     Diet - low sodium heart healthy    Complete by:  As directed      Discharge instructions    Complete by:  As directed   Pick up stool softner and laxative for home use following surgery while on pain medications. Do not submerge incision under water. May remove surgical dressing tomorrow, Friday 03/05/2015, and then apply a dry gauze dressing daily. Please use good hand washing techniques while changing dressing each day. May shower starting three days after surgery starting Saturday 03/06/2015 Please use a clean towel to pat the incision dry following showers. Continue to use ice for pain and swelling after surgery. Do not use any lotions or creams on the incision until instructed by your surgeon. Hip precautions.  Total Hip Protocol.  Take for three weeks and then reduce to a baby Aspirin 81 mg daily for three additional weeks.  Postoperative Constipation Protocol  Constipation - defined medically as fewer than three stools per week and severe constipation as less than one stool per week.  One of the most common issues patients have following surgery is constipation.  Even if you have a regular bowel pattern at home, your normal regimen is likely to be disrupted due to multiple reasons following surgery.  Combination of anesthesia, postoperative narcotics, change in appetite and fluid intake all can affect your bowels.  In order to avoid complications following surgery, here are some recommendations in order to help you during your recovery period.  Colace (docusate) - Pick up an over-the-counter form of Colace or another stool softener and take twice a day as long as you are requiring postoperative pain medications.  Take with a full  glass of water daily.  If you experience loose stools or diarrhea, hold the colace until you stool forms back up.  If your symptoms do not get better within 1 week or if they get worse, check with your doctor.  Dulcolax (bisacodyl) - Pick up over-the-counter and take as directed by the product packaging as needed to assist with the movement of your bowels.  Take with a full glass of water.  Use this product as needed if not relieved by Colace only.   MiraLax (polyethylene glycol) - Pick up over-the-counter to have on hand.  MiraLax is a solution that will increase the amount of water in your bowels to assist with bowel movements.  Take as directed and can mix with a glass of water, juice, soda, coffee, or tea.  Take if you go more than two days without a movement. Do not use MiraLax more than once per day. Call your doctor if you are still constipated or irregular after using this medication for 7 days in a row.  If you continue to have problems with postoperative constipation,  please contact the office for further assistance and recommendations.  If you experience "the worst abdominal pain ever" or develop nausea or vomiting, please contact the office immediatly for further recommendations for treatment.     Do not sit on low chairs, stoools or toilet seats, as it may be difficult to get up from low surfaces    Complete by:  As directed      Driving restrictions    Complete by:  As directed   No driving until released by the physician.     Follow the hip precautions as taught in Physical Therapy    Complete by:  As directed      Increase activity slowly as tolerated    Complete by:  As directed      Lifting restrictions    Complete by:  As directed   No lifting until released by the physician.     Patient may shower    Complete by:  As directed   You may shower without a dressing once there is no drainage.  Do not wash over the wound.  If drainage remains, do not shower until drainage stops.       TED hose    Complete by:  As directed   Use stockings (TED hose) for 3 weeks on both leg(s).  You may remove them at night for sleeping.     Weight bearing as tolerated    Complete by:  As directed   Laterality:  right  Extremity:  Lower            Medication List    TAKE these medications        amphetamine-dextroamphetamine 20 MG 24 hr capsule  Commonly known as:  ADDERALL XR  Take 1 capsule (20 mg total) by mouth daily.     aspirin 325 MG EC tablet  Take 1 tablet (325 mg total) by mouth daily with breakfast. Take for three weeks and then reduce to a baby Aspirin 81 mg daily for three additional weeks.     carvedilol 6.25 MG tablet  Commonly known as:  COREG  Take 6.25 mg by mouth 2 (two) times daily with a meal.     colchicine 0.6 MG tablet  Take 0.6 mg by mouth 2 (two) times daily as needed (gout).     febuxostat 40 MG tablet  Commonly known as:  ULORIC  Take 80 mg by mouth daily.     ULORIC 80 MG Tabs  Generic drug:  Febuxostat  Take 80 mg by mouth every morning.     indomethacin 50 MG capsule  Commonly known as:  INDOCIN  Take 50 mg by mouth 2 (two) times daily with a meal.     lisinopril 20 MG tablet  Commonly known as:  PRINIVIL,ZESTRIL  Take 20 mg by mouth daily.     methocarbamol 500 MG tablet  Commonly known as:  ROBAXIN  Take 1 tablet (500 mg total) by mouth every 6 (six) hours as needed for muscle spasms.     oxyCODONE 5 MG immediate release tablet  Commonly known as:  Oxy IR/ROXICODONE  Take 1-2 tablets (5-10 mg total) by mouth every 3 (three) hours as needed for moderate pain or severe pain.     rosuvastatin 20 MG tablet  Commonly known as:  CRESTOR  Take 1 tablet (20 mg total) by mouth daily.     tamsulosin 0.4 MG Caps capsule  Commonly known as:  FLOMAX  Take 0.4 mg by  mouth daily.     traMADol 50 MG tablet  Commonly known as:  ULTRAM  Take 1-2 tablets (50-100 mg total) by mouth every 6 (six) hours as needed (mild pain).            Follow-up Information    Follow up with Gearlean Alf, MD. Schedule an appointment as soon as possible for a visit on 03/16/2015.   Specialty:  Orthopedic Surgery   Why:  Call office at [phone] to [place] appointment on Tuesday 03/16/2015 with Dr. Wynelle Link.   Contact information:   [address] [place] [address] [phone]       Signed: Arlee Muslim, PA-C Orthopaedic Surgery 03/04/2015, 7:21 AM

## 2015-03-04 NOTE — Progress Notes (Signed)
Physical Therapy Treatment Patient Details Name: [name] MRN: [id number] DOB: [birth year] Today's Date: 03/04/2015    History of Present Illness 59 yo male s/p R hip bearing surface revision 03/03/15. Hx of R THA-posterior 2009    PT Comments    At pt's request, practiced ambulation and stair negotiation with crutches. (Pt practiced ambulation with walker during 1st session). Pt states he prefers to have the option to use crutches sometimes. Min guard assist for ambulation with crutch use. However, no crutches available in ED or 6th floor equipment storage to issue pt. Instructed pt that if he chooses to he can purchase crutches from medical supply or drugstore. Verbally reviewed stair negotiation with 1 handrail, 1 HHA with pt/fiance. Reviewed hip precautions and car transfer technique. All education completed.   Follow Up Recommendations  Home health PT     Equipment Recommendations  Rolling walker with 5" wheels    Recommendations for Other Services OT consult     Precautions / Restrictions Precautions Precautions: Fall;Posterior Hip Precaution Comments: Reviewed hip precautions with fiance as well during 2nd session Restrictions Weight Bearing Restrictions: Yes RLE Weight Bearing: Weight bearing as tolerated    Mobility  Bed Mobility   Transfers Overall transfer level: Needs assistance Equipment used: Rolling walker (2 wheeled) Transfers: Sit to/from Stand Sit to Stand: Supervision         General transfer comment: Supervision for safety, cues for hand placement and technique.   Ambulation/Gait Ambulation/Gait assistance: Min guard Ambulation Distance (Feet): 125 Feet Assistive device: Crutches Gait Pattern/deviations: Step-to pattern;Trunk flexed     General Gait Details: Assist to stabilize. VCs safety, sequence,posture. Pt moves quickly-cues to slow down for improved control.   Stairs Stairs: Yes   Stair Management: Step to pattern;With crutches   General stair comments: Practiced with 2 crutches. Verbally reviewed 1 rail, 1 HHA technique with pt and fiance (no crutches available in supply room in ED to issue pt-explained this to pt as well).   Wheelchair Mobility    Modified Rankin (Stroke Patients Only)       Balance Overall balance assessment: Needs assistance Sitting-balance support: No upper extremity supported;Feet supported Sitting balance-Leahy Scale: Good     Standing balance support: Bilateral upper extremity supported;During functional activity Standing balance-Leahy Scale: Fair                      Cognition Arousal/Alertness: Awake/alert Behavior During Therapy: WFL for tasks assessed/performed Overall Cognitive Status: Within Functional Limits for tasks assessed                      Exercises      General Comments        Pertinent Vitals/Pain Pain Assessment: 0-10 Pain Score: 5  Faces Pain Scale: Hurts little more Pain Location: Right hip with activity Pain Descriptors / Indicators: Sore;Burning Pain Intervention(s): Monitored during session;Repositioned    Home Living Family/patient expects to be discharged to:: Private residence Living Arrangements: Spouse/significant other Available Help at Discharge: Home health Type of Home: House Home Access: Stairs to enter Entrance Stairs-Rails: Right;Left Home Layout: Two level;Able to live on main level with bedroom/bathroom Home Equipment: None      Prior Function Level of Independence: Independent          PT Goals (current goals can now be found in the care plan section) Acute Rehab PT Goals Patient Stated Goal: go back to work PT Goal Formulation: With patient Time For Goal  Achievement: 03/11/15 Potential to Achieve Goals: Good Progress towards PT goals: Progressing toward goals    Frequency  7X/week    PT Plan Current plan remains appropriate    Co-evaluation             End of Session Equipment Utilized  During Treatment: Gait belt Activity Tolerance: Patient tolerated treatment well Patient left: in chair;with call bell/phone within reach;with family/visitor present     Time: 1610-9604 PT Time Calculation (min) (ACUTE ONLY): 17 min  Charges:  $Gait Training: 8-22 mins                    G Codes:      Rebeca Alert, MPT Pager: [phone]

## 2015-03-04 NOTE — Progress Notes (Signed)
   Subjective: 1 Day Post-Op Procedure(s) (LRB): RIGHT HIP BEARING SURFACE REVISION (Right) Patient reports pain as mild.   Patient seen in rounds by Dr. Lequita Halt. Patient is well, but has had some minor complaints of pain in the hipo, requiring pain medications Patient is ready to go home today after therapy  Objective: Vital signs in last 24 hours: Temp:  [97.5 F (36.4 C)-98.7 F (37.1 C)] 98.3 F (36.8 C) (09/29 0653) Pulse Rate:  [65-96] 84 (09/29 0653) Resp:  [8-17] 16 (09/29 0653) BP: (115-151)/(46-90) 127/70 mmHg (09/29 0653) SpO2:  [92 %-100 %] 98 % (09/29 0653) Weight:  [110.678 kg (244 lb)] 110.678 kg (244 lb) (09/28 1340)  Intake/Output from previous day:  Intake/Output Summary (Last 24 hours) at 03/04/15 0709 Last data filed at 03/04/15 0654  Gross per 24 hour  Intake 5313.33 ml  Output   5470 ml  Net -156.67 ml    Labs:  Recent Labs  03/04/15 0520  HGB 14.6    Recent Labs  03/04/15 0520  WBC 11.9*  RBC 5.13  HCT 44.0  PLT 187    Recent Labs  03/04/15 0520  NA 135  K 4.7  CL 103  CO2 26  BUN 17  CREATININE 0.83  GLUCOSE 151*  CALCIUM 8.6*   No results for input(s): LABPT, INR in the last 72 hours.  EXAM: General - Patient is Alert, Appropriate and Oriented Extremity - Neurovascular intact Sensation intact distally Dorsiflexion/Plantar flexion intact Dressing - clean, dry Motor Function - intact, moving foot and toes well on exam.  HV pulled  Assessment/Plan: 1 Day Post-Op Procedure(s) (LRB): RIGHT HIP BEARING SURFACE REVISION (Right) Procedure(s) (LRB): RIGHT HIP BEARING SURFACE REVISION (Right) Past Medical History  Diagnosis Date  . GOUT   . EXOGENOUS OBESITY   . ARTHRITIS   . CHONDROMALACIA PATELLA, LEFT   . HIP REPLACEMENT, BILATERAL, HX OF   . SOB (shortness of breath)   . Obstructive apnea     patient could not tolerate CPAP due to nasal airflow restriction.   . Hypertension   . Arthritis    Principal  Problem:   Failed total hip arthroplasty  Estimated body mass index is 38.21 kg/(m^2) as calculated from the following:   Height as of this encounter: 5\' 7"  (1.702 m).   Weight as of this encounter: 110.678 kg (244 lb). Up with therapy Discharge home with home health Diet - Cardiac diet Follow up - in 2 weeks Activity - WBAT Disposition - Home Condition Upon Discharge - Good D/C Meds - See DC Summary DVT Prophylaxis - Aspirin 325 mg daily  Avel Peace, PA-C Orthopaedic Surgery 03/04/2015, 7:09 AM

## 2015-03-04 NOTE — Evaluation (Signed)
Occupational Therapy Evaluation Patient Details Name: [name] MRN: [id number] DOB: Dec 29, 1955 Today's Date: 03/04/2015    History of Present Illness 59 yo male s/p R hip bearing surface revision 03/03/15. Hx of R THA-posterior 2009   Clinical Impression   Patient presenting with decreased ADL and functional mobility independence secondary to above. Patient independent PTA. Patient currently functioning at an overall supervision level for functional mobility and requires up to max assist for LB ADLs secondary to posterior hip precautions. Patient will benefit from acute OT to increase overall independence in the areas of ADLs, functional mobility, and overall safety in order to safely discharge home with intermittent supervision.     Follow Up Recommendations  No OT follow up;Supervision - Intermittent    Equipment Recommendations  3 in 1 bedside comode;Other (comment) (AE - reacher, sock aid, LH sponge, LH shoe horn)    Recommendations for Other Services  None at this time  Precautions / Restrictions Precautions Precautions: Fall;Posterior Hip Precaution Comments: administerred posterior hip precaution handout and AE handout Restrictions Weight Bearing Restrictions: Yes RLE Weight Bearing: Weight bearing as tolerated    Mobility Bed Mobility Overal bed mobility: Needs Assistance Bed Mobility: Supine to Sit     Supine to sit: Min assist     General bed mobility comments: Pt recieved seated in recliner upon OT entering/exiting room  Transfers Overall transfer level: Needs assistance Equipment used: Rolling walker (2 wheeled) Transfers: Sit to/from Stand Sit to Stand: Supervision General transfer comment: Supervision for safety, cues for hand placement and technique.     Balance Overall balance assessment: Needs assistance Sitting-balance support: No upper extremity supported;Feet supported Sitting balance-Leahy Scale: Good     Standing balance support: Bilateral  upper extremity supported;During functional activity Standing balance-Leahy Scale: Fair    ADL Overall ADL's : Needs assistance/impaired Eating/Feeding: Set up;Sitting   Grooming: Set up;Sitting   Upper Body Bathing: Set up;Sitting   Lower Body Bathing: Maximal assistance;Sit to/from stand   Upper Body Dressing : Set up;Sitting   Lower Body Dressing: Maximal assistance;Sit to/from stand   Toilet Transfer: Supervision/safety;RW;BSC       Tub/ Engineer, structural: Psychologist, counselling;Ambulation;Rolling walker Tub/Shower Transfer Details (indicate cue type and reason): simulated like tub/shower combo   General ADL Comments: Pt impulsive and requires supervision for mobility for safety. Pt able to state 2/3 posterior hip precautions. Educated pt on use of AE to increase independence with LB ADLs while adhereing to posterior hip precautions. Pt ambulated into BR for toilet transfer using BSC and walk-in shower transfer (simulated like tub/shower combo). Cues required for safety using RW during transfers.     Pertinent Vitals/Pain Pain Assessment: Faces Pain Score: 5  Faces Pain Scale: Hurts little more Pain Location: right hip with activity Pain Descriptors / Indicators: Aching;Guarding Pain Intervention(s): Limited activity within patient's tolerance;Monitored during session;Repositioned     Hand Dominance Right   Extremity/Trunk Assessment Upper Extremity Assessment Upper Extremity Assessment: Overall WFL for tasks assessed   Lower Extremity Assessment Lower Extremity Assessment: Defer to PT evaluation RLE Deficits / Details: hip flex at least 2/5, hip abd/add at least 2/5, moves ankle well RLE: Unable to fully assess due to pain   Cervical / Trunk Assessment Cervical / Trunk Assessment: Normal   Communication Communication Communication: No difficulties   Cognition Arousal/Alertness: Awake/alert Behavior During Therapy: WFL for tasks assessed/performed Overall Cognitive  Status: Within Functional Limits for tasks assessed  Home Living Family/patient expects to be discharged to:: Private residence Living Arrangements: Spouse/significant other Available Help at Discharge: Home health Type of Home: House Home Access: Stairs to enter [employer] of Steps: 4 Entrance Stairs-Rails: Right;Left Home Layout: Two level;Able to live on main level with bedroom/bathroom     Bathroom Shower/Tub: Tub/shower unit;Curtain   Bathroom Toilet: Standard     Home Equipment: None   Prior Functioning/Environment Level of Independence: Independent     OT Diagnosis: Generalized weakness;Acute pain   OT Problem List: Decreased strength;Decreased activity tolerance;Impaired balance (sitting and/or standing);Decreased safety awareness;Decreased knowledge of use of DME or AE;Decreased knowledge of precautions;Pain   OT Treatment/Interventions: Self-care/ADL training;Energy conservation;DME and/or AE instruction;Therapeutic activities;Patient/family education;Balance training    OT Goals(Current goals can be found in the care plan section) Acute Rehab OT Goals Patient Stated Goal: go back to work OT Goal Formulation: With patient Time For Goal Achievement: 03/18/15 Potential to Achieve Goals: Good ADL Goals Pt Will Perform Grooming: with modified independence;standing Pt Will Perform Lower Body Bathing: with modified independence;sit to/from stand;with adaptive equipment Pt Will Perform Lower Body Dressing: with modified independence;sit to/from stand;with adaptive equipment Pt Will Transfer to Toilet: with modified independence;ambulating;bedside commode Pt Will Perform Tub/Shower Transfer: Tub transfer;ambulating;rolling walker;with modified independence Additional ADL Goal #1: Pt will veralize and adhere to posterior hip precautions 100% of the time  OT Frequency: Min 2X/week   Barriers to D/C: None known at this time   End of Session  Equipment Utilized During Treatment: Rolling walker  Activity Tolerance: Patient tolerated treatment well Patient left: in chair;with call bell/phone within reach   Time: 1119-1144 OT Time Calculation (min): 25 min Charges:  OT General Charges $OT Visit: 1 Procedure OT Evaluation $Initial OT Evaluation Tier I: 1 Procedure OT Treatments $Self Care/Home Management : 8-22 mins  Quantay Zaremba , MS, OTR/L, CLT Pager: [phone]  03/04/2015, 11:56 AM

## 2015-03-07 LAB — BODY FLUID CULTURE: CULTURE: NO GROWTH

## 2015-03-08 NOTE — Anesthesia Postprocedure Evaluation (Signed)
  Anesthesia Post-op Note  Patient: [name]  Procedure(s) Performed: Procedure(s): RIGHT HIP BEARING SURFACE REVISION (Right)  Patient Location: PACU  Anesthesia Type:General  Level of Consciousness: awake, alert  and sedated  Airway and Oxygen Therapy: Patient Spontanous Breathing  Post-op Pain: mild  Post-op Assessment: Post-op Vital signs reviewed LLE Motor Response: Purposeful movement, Responds to commands   RLE Motor Response: Purposeful movement, Responds to commands RLE Sensation: Full sensation      Post-op Vital Signs: stable  Last Vitals:  Filed Vitals:   03/04/15 0927  BP: 119/74  Pulse: 90  Temp:   Resp:     Complications: No apparent anesthesia complications

## 2015-03-09 LAB — ANAEROBIC CULTURE

## 2016-09-15 ENCOUNTER — Other Ambulatory Visit: Payer: Self-pay | Admitting: Orthopedic Surgery

## 2016-09-15 DIAGNOSIS — Z96642 Presence of left artificial hip joint: Secondary | ICD-10-CM

## 2016-09-30 ENCOUNTER — Other Ambulatory Visit: Payer: BLUE CROSS/BLUE SHIELD

## 2017-06-11 ENCOUNTER — Telehealth: Payer: Self-pay | Admitting: Neurology

## 2017-06-11 NOTE — Telephone Encounter (Signed)
Patient called and requested to speak with someone regarding a part he needs replaced for his CPAP machine. He states that he called the company and they are no longer in business. Please call and advise.

## 2017-06-12 NOTE — Telephone Encounter (Signed)
Spoke with patient and explained that we do not have parts for cpap machine. His last sleep study was over 5 years ago and if he needs a new machine we need another referral from his pcp. I told him to try [email or web address] and see if they have apart he can order. He understood.

## 2018-06-25 ENCOUNTER — Other Ambulatory Visit: Payer: Self-pay | Admitting: Cardiology

## 2018-06-25 DIAGNOSIS — I429 Cardiomyopathy, unspecified: Secondary | ICD-10-CM

## 2018-07-09 ENCOUNTER — Ambulatory Visit: Payer: BC Managed Care – PPO

## 2018-07-09 DIAGNOSIS — I420 Dilated cardiomyopathy: Secondary | ICD-10-CM | POA: Diagnosis not present

## 2018-07-09 DIAGNOSIS — I429 Cardiomyopathy, unspecified: Secondary | ICD-10-CM

## 2018-07-11 ENCOUNTER — Telehealth: Payer: Self-pay | Admitting: Cardiology

## 2018-07-11 NOTE — Telephone Encounter (Signed)
D/W patient, presently asymptomatic and exercising daily. Will set up OV in 1 month to review meds and exam. Please set up OV

## 2018-07-22 ENCOUNTER — Telehealth: Payer: Self-pay

## 2018-07-23 ENCOUNTER — Telehealth: Payer: Self-pay

## 2018-07-23 NOTE — Telephone Encounter (Signed)
07/23/18 patient was called to sch a 1 month ov as per Dr. Jacinto Halim. VM was left for patient to call back and sch this appt.  [name]

## 2018-08-29 ENCOUNTER — Other Ambulatory Visit: Payer: Self-pay | Admitting: Cardiology

## 2018-10-22 ENCOUNTER — Other Ambulatory Visit: Payer: Self-pay | Admitting: Cardiology

## 2018-10-22 NOTE — Telephone Encounter (Signed)
Please fill

## 2018-10-23 ENCOUNTER — Other Ambulatory Visit: Payer: Self-pay | Admitting: Cardiology

## 2018-10-23 DIAGNOSIS — I447 Left bundle-branch block, unspecified: Secondary | ICD-10-CM

## 2018-10-23 MED ORDER — BUPROPION HCL ER (SR) 150 MG PO TB12
150.0000 mg | ORAL_TABLET | Freq: Two times a day (BID) | ORAL | 1 refills | Status: DC
Start: 2018-10-23 — End: 2020-03-22

## 2018-10-23 NOTE — Telephone Encounter (Signed)
Please fill

## 2018-11-05 ENCOUNTER — Other Ambulatory Visit: Payer: Self-pay | Admitting: Orthopedic Surgery

## 2018-11-05 DIAGNOSIS — M4726 Other spondylosis with radiculopathy, lumbar region: Secondary | ICD-10-CM

## 2018-11-06 ENCOUNTER — Ambulatory Visit
Admission: RE | Admit: 2018-11-06 | Discharge: 2018-11-06 | Disposition: A | Discharge status: Home / Self Care | Payer: BLUE CROSS/BLUE SHIELD | Source: Ambulatory Visit | Attending: Orthopedic Surgery | Admitting: Orthopedic Surgery

## 2018-11-06 ENCOUNTER — Other Ambulatory Visit: Payer: Self-pay

## 2018-11-06 DIAGNOSIS — M4726 Other spondylosis with radiculopathy, lumbar region: Secondary | ICD-10-CM

## 2019-02-17 ENCOUNTER — Other Ambulatory Visit: Payer: Self-pay

## 2019-04-21 ENCOUNTER — Other Ambulatory Visit: Payer: Self-pay | Admitting: Cardiology

## 2019-08-10 ENCOUNTER — Ambulatory Visit: Payer: BLUE CROSS/BLUE SHIELD | Attending: Internal Medicine

## 2019-08-10 DIAGNOSIS — Z23 Encounter for immunization: Secondary | ICD-10-CM

## 2019-08-10 NOTE — Progress Notes (Signed)
   Covid-19 Vaccination Clinic  Name:  JORIEL STREETY    MRN: [id number] DOB: Oct 11, 1955  08/10/2019  Mr. Oommen was observed post Covid-19 immunization for 15 minutes without incident. He was provided with Vaccine Information Sheet and instruction to access the V-Safe system.   Mr. Justo was instructed to call 911 with any severe reactions post vaccine: Marland Kitchen Difficulty breathing  . Swelling of face and throat  . A fast heartbeat  . A bad rash all over body  . Dizziness and weakness   Immunizations Administered    Name Date Dose VIS Date Route   Pfizer COVID-19 Vaccine 08/10/2019 12:33 PM 0.3 mL 05/16/2019 Intramuscular   Manufacturer: [employer], [employer]   Lot: [id number]   NDC: [address]-8

## 2019-09-04 ENCOUNTER — Ambulatory Visit: Payer: BC Managed Care – PPO | Attending: Internal Medicine

## 2019-09-04 DIAGNOSIS — Z23 Encounter for immunization: Secondary | ICD-10-CM

## 2019-09-04 NOTE — Progress Notes (Signed)
   Covid-19 Vaccination Clinic  Name:  [name]    MRN: [id number] DOB: [birth year]  09/04/2019  [name] was observed post Covid-19 immunization for 15 minutes without incident. He was provided with Vaccine Information Sheet and instruction to access the V-Safe system.   [name] was instructed to call 911 with any severe reactions post vaccine: Marland Kitchen Difficulty breathing  . Swelling of face and throat  . A fast heartbeat  . A bad rash all over body  . Dizziness and weakness   Immunizations Administered    Name Date Dose VIS Date Route   Pfizer COVID-19 Vaccine 09/04/2019  8:26 AM 0.3 mL 05/16/2019 Intramuscular   Manufacturer: [employer], [employer]   Lot: [id number]   NDC: [address]-9

## 2019-09-09 ENCOUNTER — Ambulatory Visit: Payer: BLUE CROSS/BLUE SHIELD

## 2019-10-13 ENCOUNTER — Other Ambulatory Visit: Payer: Self-pay | Admitting: Cardiology

## 2020-02-16 ENCOUNTER — Other Ambulatory Visit: Payer: Self-pay | Admitting: Orthopedic Surgery

## 2020-03-04 ENCOUNTER — Encounter: Admission: RE | Payer: Self-pay | Source: Home / Self Care

## 2020-03-04 ENCOUNTER — Inpatient Hospital Stay
Admission: RE | Admit: 2020-03-04 | Payer: BC Managed Care – PPO | Source: Home / Self Care | Admitting: Orthopedic Surgery

## 2020-03-04 SURGERY — TRANSFORAMINAL LUMBAR INTERBODY FUSION (TLIF) WITH PEDICLE SCREW FIXATION 1 LEVEL
Anesthesia: General | Laterality: Right

## 2020-03-12 ENCOUNTER — Encounter: Payer: Self-pay | Admitting: Gastroenterology

## 2020-03-22 ENCOUNTER — Other Ambulatory Visit: Payer: Self-pay

## 2020-03-22 ENCOUNTER — Ambulatory Visit: Payer: BC Managed Care – PPO | Admitting: Student

## 2020-03-22 ENCOUNTER — Encounter: Payer: Self-pay | Admitting: Student

## 2020-03-22 VITALS — BP 104/66 | HR 66 | Resp 16 | Ht 67.0 in | Wt 231.0 lb

## 2020-03-22 DIAGNOSIS — I420 Dilated cardiomyopathy: Secondary | ICD-10-CM

## 2020-03-22 DIAGNOSIS — Z6836 Body mass index (BMI) 36.0-36.9, adult: Secondary | ICD-10-CM

## 2020-03-22 DIAGNOSIS — I10 Essential (primary) hypertension: Secondary | ICD-10-CM

## 2020-03-22 DIAGNOSIS — G4733 Obstructive sleep apnea (adult) (pediatric): Secondary | ICD-10-CM

## 2020-03-22 DIAGNOSIS — I447 Left bundle-branch block, unspecified: Secondary | ICD-10-CM

## 2020-03-22 MED ORDER — SPIRONOLACTONE 25 MG PO TABS: 12.5000 mg | ORAL_TABLET | Freq: Every day | ORAL | 3 refills | Status: DC

## 2020-03-22 MED ORDER — ASPIRIN 81 MG PO TBEC: 81.0000 mg | DELAYED_RELEASE_TABLET | Freq: Every day | ORAL | 3 refills | Status: DC

## 2020-03-22 NOTE — Patient Instructions (Signed)
Blood work in 1 week at [facility]  

## 2020-03-22 NOTE — Progress Notes (Addendum)
Primary Physician/Referring:  Ralene Ok, MD  Patient ID: [name], male    DOB: 09-Aug-1955, 64 y.o.   MRN: [id number]  Chief Complaint  Patient presents with  . Cardiomyopathy  . Follow-up    1 year   HPI:    [name]  is a 64 y.o. male with history of hypercholesteremia, hypertension, OSA noncompliant with CPAP, and gout. 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  He was last seen by Dr. Jacinto Halim 02/11/2018.  Last visit Sherryll Burger was increased to 97/23 mg and patient was started on Wellbutrin to assist in weight loss.  At last visit also discussed BiV ICD, patient preferred to wait at this time.  Patient was instructed to follow-up in 3 months, however he was unfortunately lost to follow-up.  Patient presents for follow-up today he is reportedly doing well and remains asymptomatic.  Denies chest pain, orthopnea, dizziness, syncope, shortness of breath, PND, swelling.  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  He does not have formal exercise routine.  He is currently only taking his Entresto and his carvedilol once daily.  Continues to eat high calorie diet.  Past Medical History:  Diagnosis Date  . ARTHRITIS   . Arthritis   . CHONDROMALACIA PATELLA, LEFT   . EXOGENOUS OBESITY   . GOUT   . HIP REPLACEMENT, BILATERAL, HX OF   . Hypertension   . Obstructive apnea    patient could not tolerate CPAP due to nasal airflow restriction.   . SOB (shortness of breath)    Past Surgical History:  Procedure Laterality Date  . JOINT REPLACEMENT    . NASAL SINUS SURGERY     Dr . Haroldine Laws , October 2014 , nasal septum   . TOTAL HIP REVISION Right 03/03/2015   Procedure: RIGHT HIP BEARING SURFACE REVISION;  Surgeon: Ollen Gross, MD;  Location: [facility];  Service: Orthopedics;  Laterality: Right;   Family History  Problem Relation Age of Onset  . Heart disease Brother   . Hypertension Brother   .  Hyperlipidemia Brother   . Ataxia Neg Hx   . Chorea Neg Hx   . Dementia Neg Hx   . Mental retardation Neg Hx   . Migraines Neg Hx   . Multiple sclerosis Neg Hx   . Neurofibromatosis Neg Hx   . Neuropathy Neg Hx   . Parkinsonism Neg Hx   . Seizures Neg Hx   . Stroke Neg Hx     Social History   Tobacco Use  . Smoking status: Never Smoker  . Smokeless tobacco: Never Used  Substance Use Topics  . Alcohol use: No   Marital Status: Single   ROS  Review of Systems  Constitutional: Negative for malaise/fatigue.  Cardiovascular: Negative for chest pain, claudication, dyspnea on exertion, leg swelling, near-syncope, orthopnea, palpitations, paroxysmal nocturnal dyspnea and syncope.  Hematologic/Lymphatic: Does not bruise/bleed easily.  Gastrointestinal: Negative for melena.    Objective  Blood pressure 104/66, pulse 66, resp. rate 16, height 5\' 7"  (1.702 m), weight 231 lb (104.8 kg), SpO2 97 %.  Vitals with BMI 03/22/2020 03/04/2015 03/04/2015  Height 5\' 7"  - -  Weight 231 lbs - -  BMI 36.17 - -  Systolic 104 119 492  Diastolic 66 74 74  Pulse 66 90 52   Body mass index is 36.18 kg/m.    Physical Exam Vitals reviewed.  HENT:  Head: Normocephalic and atraumatic.  Cardiovascular:     Rate and Rhythm: Normal rate and regular rhythm.     Pulses: Intact distal pulses.          Carotid pulses are 2+ on the right side and 2+ on the left side.      Radial pulses are 2+ on the right side and 2+ on the left side.       Femoral pulses are 2+ on the right side and 2+ on the left side.      Popliteal pulses are 2+ on the right side and 2+ on the left side.       Dorsalis pedis pulses are 2+ on the right side and 2+ on the left side.       Posterior tibial pulses are 2+ on the right side and 2+ on the left side.     Heart sounds: S1 normal and S2 normal. No murmur heard.  No gallop.      Comments: No JVD.  Pulmonary:     Effort: Pulmonary effort is normal. No respiratory  distress.     Breath sounds: No wheezing, rhonchi or rales.  Musculoskeletal:     Right lower leg: No edema.     Left lower leg: No edema.  Neurological:     Mental Status: He is alert.     Laboratory examination:   No results for input(s): NA, K, CL, CO2, GLUCOSE, BUN, CREATININE, CALCIUM, GFRNONAA, GFRAA in the last 8760 hours. CrCl cannot be calculated (Patient's most recent lab result is older than the maximum 21 days allowed.).  CMP Latest Ref Rng & Units 03/04/2015 02/22/2015 11/04/2008  Glucose 65 - 99 mg/dL 147(W) 90 90  BUN 6 - 20 mg/dL 17 29(F) 16  Creatinine 0.61 - 1.24 mg/dL 6.21 3.08 0.8  Sodium 657 - 145 mmol/L 135 133(L) 144  Potassium 3.5 - 5.1 mmol/L 4.7 4.2 4.2  Chloride 101 - 111 mmol/L 103 101 114(H)  CO2 22 - 32 mmol/L Calcium 8.9 - 10.3 mg/dL 8.4(O) 8.9 9.4  Total Protein 6.5 - 8.1 g/dL - 7.5 7.7  Total Bilirubin 0.3 - 1.2 mg/dL - 0.7 1.1  Alkaline Phos 38 - 126 U/L - 61 65  AST 15 - 41 U/L - 39 30  ALT 17 - 63 U/L - 46 41   CBC Latest Ref Rng & Units 03/04/2015 02/22/2015 11/04/2008  WBC 4.0 - 10.5 K/uL 11.9(H) 11.1(H) 7.0  Hemoglobin 13.0 - 17.0 g/dL 96.2 95.2 84.1  Hematocrit 39 - 52 % 44.0 49.5 41.0  Platelets 150 - 400 K/uL 187 201 233.0    Lipid Panel No results for input(s): CHOL, TRIG, LDLCALC, VLDL, HDL, CHOLHDL, LDLDIRECT in the last 8760 hours.  HEMOGLOBIN A1C No results found for: HGBA1C, MPG TSH No results for input(s): TSH in the last 8760 hours.  External labs:  02/27/2020: Total cholesterol 174, triglycerides 72, HDL 62, LDL 98 Hemoglobin 32.4, hematocrit 39.7, platelets 214, CBC otherwise within normal limits. Sodium 141, potassium 4.1, BUN 23, creatinine 0.70, GFR 97, CMP otherwise normal  Medications and allergies  No Known Allergies   Outpatient Medications Prior to Visit  Medication Sig Dispense Refill  . amphetamine-dextroamphetamine (ADDERALL) 20 MG tablet Take 20 mg by mouth daily.    . carvedilol (COREG) 25 MG  tablet TAKE 1 TABLET BY MOUTH TWICE A DAY 180 tablet 1  . colchicine 0.6 MG tablet Take 0.6 mg by mouth 2 (two) times daily  as needed (gout).    Marland Kitchen diclofenac (VOLTAREN) 75 MG EC tablet Take 75 mg by mouth 2 (two) times daily.    Marland Kitchen ENTRESTO 97-103 MG Take 1 tablet by mouth 2 (two) times daily.    Marland Kitchen HYDROcodone-acetaminophen (NORCO/VICODIN) 5-325 MG tablet Take 1 tablet by mouth every 6 (six) hours as needed.    . indomethacin (INDOCIN) 50 MG capsule Take 50 mg by mouth 2 (two) times daily with a meal.    . methocarbamol (ROBAXIN) 500 MG tablet Take 1 tablet (500 mg total) by mouth every 6 (six) hours as needed for muscle spasms. 80 tablet 0  . oxyCODONE (OXY IR/ROXICODONE) 5 MG immediate release tablet Take 1-2 tablets (5-10 mg total) by mouth every 3 (three) hours as needed for moderate pain or severe pain. 80 tablet 0  . rosuvastatin (CRESTOR) 20 MG tablet Take 1 tablet (20 mg total) by mouth daily. 90 tablet 3  . tadalafil (CIALIS) 5 MG tablet Take 5 mg by mouth daily.    . tamsulosin (FLOMAX) 0.4 MG CAPS capsule Take 0.4 mg by mouth daily.    . traMADol (ULTRAM) 50 MG tablet Take 1-2 tablets (50-100 mg total) by mouth every 6 (six) hours as needed (mild pain). 60 tablet 1  . zolpidem (AMBIEN) 10 MG tablet Take 10 mg by mouth at bedtime as needed.    Marland Kitchen aspirin EC 325 MG EC tablet Take 1 tablet (325 mg total) by mouth daily with breakfast. Take for three weeks and then reduce to a baby Aspirin 81 mg daily for three additional weeks. 21 tablet 0  . lisinopril (PRINIVIL,ZESTRIL) 20 MG tablet Take 20 mg by mouth daily.    Marland Kitchen amphetamine-dextroamphetamine (ADDERALL XR) 20 MG 24 hr capsule Take 1 capsule (20 mg total) by mouth daily. 30 capsule 0  . buPROPion (WELLBUTRIN SR) 150 MG 12 hr tablet Take 1 tablet (150 mg total) by mouth 2 (two) times daily. 180 tablet 1  . febuxostat (ULORIC) 40 MG tablet Take 80 mg by mouth daily.    . Febuxostat (ULORIC) 80 MG TABS Take 80 mg by mouth every morning.      No facility-administered medications prior to visit.     Radiology:   No results found.  Cardiac Studies:  Echocardiogram 02/046/2020: Left ventricular cavity is normal in size.  Moderate concentric hypertrophy of the LV.  Abnormal septal wall motion due to left bundle branch block.  Doppler evidence of grade 1 (impaired) diastolic dysfunction, normal LAP.  Calculated EF 26%.  Visual LVEF 25 to 30% Left atrial cavity is mildly dilated Moderate grade 3 aortic regurgitation Mild grade 1 mitral regurgitation Mild tricuspid regurgitation No evidence of pulmonary hypertension. Compared to previous study 04/18/2017, there is an interval increase in aortic regurgitation severity and minimal decrease in LV EF.  GXT 04/16/2017: Resting EKG demonstrates normal sinus rhythm, left bundle branch block.  Exercise capacity was normal.  Patient walked for 8: 30 minutes on Bruce protocol, achieved 10.1 METS and reached 85% THR. No arrhythmias no chest pain. Heart rate response to exercise: Appropriate.  BP response to exercise: Normal resting BP, exaggerated hypertensive response. ST changes: Evaluation for ischemia elevated in the setting of left bundle branch block. Stress test terminated due to fatigue. Normal exercise capacity for age.  Nondiagnostic treadmill stress test due to underlying left bundle branch block.  Clinical correlation rec  EKG:  EKG 03/22/2020: Sinus rhythm at a rate of 67 bpm with first-degree AV block.  Left  atrial enlargement. Left bundle branch block, no further analysis.  Compared to EKG 02/11/2018, no significant change.   Assessment     ICD-10-CM   1. Dilated cardiomyopathy (HCC)  I42.0 EKG 12-Lead    aspirin 81 MG EC tablet    spironolactone (ALDACTONE) 25 MG tablet    Basic metabolic panel    Basic metabolic panel  2. Essential hypertension  I10 spironolactone (ALDACTONE) 25 MG tablet    Basic metabolic panel    Basic metabolic panel  3. Left bundle branch  block  I44.7   4. Obstructive sleep apnea  G47.33   5. Class 2 severe obesity due to excess calories with serious comorbidity and body mass index (BMI) of 36.0 to 36.9 in adult [facility])  E66.01    Z68.36      Medications Discontinued During This Encounter  Medication Reason  . Febuxostat (ULORIC) 80 MG TABS Patient Preference  . febuxostat (ULORIC) 40 MG tablet Patient Preference  . buPROPion (WELLBUTRIN SR) 150 MG 12 hr tablet Patient Preference  . amphetamine-dextroamphetamine (ADDERALL XR) 20 MG 24 hr capsule Patient Preference  . lisinopril (PRINIVIL,ZESTRIL) 20 MG tablet Change in therapy  . aspirin EC 325 MG EC tablet Reorder    Meds ordered this encounter  Medications  . aspirin 81 MG EC tablet    Sig: Take 1 tablet (81 mg total) by mouth daily.    Dispense:  90 tablet    Refill:  3  . spironolactone (ALDACTONE) 25 MG tablet    Sig: Take 0.5 tablets (12.5 mg total) by mouth daily.    Dispense:  15 tablet    Refill:  3    Recommendations:   [name] is a 64 y.o. male with history of hypercholesteremia, hypertension, OSA non-compliant with CPAP, and gout. Followed by our office for nonischemic cardiomyopathy with reduced ejection fraction of 25 to 30%.  Patient presents for 1 year follow-up.  He remains asymptomatic without signs of acute heart failure and EKG is similar to previous with left bundle branch block.  Notably patient has only been taking his Entresto and his carvedilol once daily each.  Instructed patient to take carvedilol 25 mg twice daily and Entresto 97/103 mg twice daily.  In view of patient's reduced LVEF, will add spironolactone 12.5 mg once daily as his creatinine is <2.5 and potassium <5.0.  Will check BMP in 7 to 10 days.  Patient's blood pressure is currently well controlled.  Reviewed external labs from PCP, lipids are well controlled.  We will continue rosuvastatin 20 mg daily.  Patient reports he continues to take aspirin 81 mg daily, however reason  for this is unclear as he has no history of CAD.  Will discuss with patient again at next visit, may consider stopping aspirin in the future.   Of note patient is not currently compliant with CPAP.  Encourage patient to follow-up with primary care provider for sleep provider to discuss strategies to improve patient compliance.  Weight loss was again discussed with patient.  He reports his PCP started him on a medication, the name of which he does not know, though will reportedly assist in weight loss.  Again stressed the importance of healthy diet and lifestyle, patient expressed understanding.  Patient had several questions regarding medical conditions, medications, and risks and benefits of treatment options.  Patient's questions and concerns were addressed.  Follow-up in 2 to 4 weeks via telephone.  Follow-up in office in 6 months.  During this visit  I reviewed and updated: Tobacco history  allergies medication reconciliation  medical history  surgical history  family history  social history.  This note was created using a voice recognition software as a result there may be grammatical errors inadvertently enclosed that do not reflect the nature of this encounter. Every attempt is made to correct such errors.   This was a 35-minute encounter with face-to-face counseling, medical records review, coordination of care, explanation of complex medical issues, complex medical decision making.     Rayford Halsted, PA-C 03/22/2020, 4:28 PM Office: [phone]

## 2020-04-03 ENCOUNTER — Ambulatory Visit: Payer: BC Managed Care – PPO

## 2020-04-08 ENCOUNTER — Other Ambulatory Visit: Payer: Self-pay | Admitting: Cardiology

## 2020-05-07 ENCOUNTER — Ambulatory Visit: Payer: BC Managed Care – PPO | Admitting: Nurse Practitioner

## 2020-05-07 NOTE — Progress Notes (Deleted)
05/07/2020 [name] [id number] Jun 18, 1955   CHIEF COMPLAINT:   HISTORY OF PRESENT ILLNESS: [name] is a 64 year old male with a past medical history of arthritis, hypertension, cardiomyopathy on Entresto,  L BBB, hypercholesterolemia, OSA on CPAP and gout.  Cardiologist Dr. Nadara Eaton. Seen by cardiology Elvin So PA-C on 03/22/2020 . On Entresto, Carvedilol bid and Spironolactone 12.5mg  was added.   Colonoscopy 02/11/2004 by Dr. Terrial Rhodes: Descending and sigmoid diverticulosis Probable resolving diverticulitis  External hemorrhoids Recall colonoscopy 10 years   Echocardiogram 07/09/2018:  Left ventricle cavity is normal in size. Moderate concentric hypertrophy  of the left ventricle. Abnormal septal wall motion due to left bundle  branch block. Doppler evidence of grade I (impaired) diastolic  dysfunction, normal LAP. Calculated EF 26%. Visual LVEF 25-30%  Left atrial cavity is mildly dilated.  Moderate (Grade III) aortic regurgitation.  Mild (Grade I) mitral regurgitation.  Mild tricuspid regurgitation.  No evidence of pulmonary hypertension.  Compared to previous study on 04/18/2017, there is interval increase in  aortic regurgitation severity and minimal decrease in LVEF.  GXT 04/16/2017: Resting EKG demonstrates normal sinus rhythm, left bundle branch block.  Exercise capacity was normal.  Patient walked for 8: 30 minutes on Bruce protocol, achieved 10.1 METS and reached 85% THR. No arrhythmias no chest pain. Heart rate response to exercise: Appropriate.  BP response to exercise: Normal resting BP, exaggerated hypertensive response. ST changes: Evaluation for ischemia elevated in the setting of left bundle branch block. Stress test terminated due to fatigue. Normal exercise capacity for age.  Nondiagnostic treadmill stress test due to underlying left bundle branch block.    Past Medical History:  Diagnosis Date  . ARTHRITIS   . Arthritis   .  CHONDROMALACIA PATELLA, LEFT   . EXOGENOUS OBESITY   . GOUT   . HIP REPLACEMENT, BILATERAL, HX OF   . Hypertension   . Obstructive apnea    patient could not tolerate CPAP due to nasal airflow restriction.   . SOB (shortness of breath)    Past Surgical History:  Procedure Laterality Date  . JOINT REPLACEMENT    . NASAL SINUS SURGERY     Dr . Haroldine Laws , October 2014 , nasal septum   . TOTAL HIP REVISION Right 03/03/2015   Procedure: RIGHT HIP BEARING SURFACE REVISION;  Surgeon: Ollen Gross, MD;  Location: [facility];  Service: Orthopedics;  Laterality: Right;   Social History:  Family History:    reports that he has never smoked. He has never used smokeless tobacco. He reports that he does not drink alcohol and does not use drugs. family history includes Heart disease in his brother; Hyperlipidemia in his brother; Hypertension in his brother. No Known Allergies    Outpatient Encounter Medications as of 05/07/2020  Medication Sig  . amphetamine-dextroamphetamine (ADDERALL) 20 MG tablet Take 20 mg by mouth daily.  Marland Kitchen aspirin 81 MG EC tablet Take 1 tablet (81 mg total) by mouth daily.  . carvedilol (COREG) 25 MG tablet TAKE 1 TABLET BY MOUTH TWICE A DAY  . colchicine 0.6 MG tablet Take 0.6 mg by mouth 2 (two) times daily as needed (gout).  Marland Kitchen diclofenac (VOLTAREN) 75 MG EC tablet Take 75 mg by mouth 2 (two) times daily.  Marland Kitchen ENTRESTO 97-103 MG Take 1 tablet by mouth 2 (two) times daily.  Marland Kitchen HYDROcodone-acetaminophen (NORCO/VICODIN) 5-325 MG tablet Take 1 tablet by mouth every 6 (six) hours as needed.  . indomethacin (INDOCIN) 50  MG capsule Take 50 mg by mouth 2 (two) times daily with a meal.  . methocarbamol (ROBAXIN) 500 MG tablet Take 1 tablet (500 mg total) by mouth every 6 (six) hours as needed for muscle spasms.  Marland Kitchen oxyCODONE (OXY IR/ROXICODONE) 5 MG immediate release tablet Take 1-2 tablets (5-10 mg total) by mouth every 3 (three) hours as needed for moderate pain or severe pain.  .  rosuvastatin (CRESTOR) 20 MG tablet Take 1 tablet (20 mg total) by mouth daily.  Marland Kitchen spironolactone (ALDACTONE) 25 MG tablet Take 0.5 tablets (12.5 mg total) by mouth daily. (Patient taking differently: Take 25 mg by mouth daily. )  . tadalafil (CIALIS) 5 MG tablet Take 5 mg by mouth daily.  . tamsulosin (FLOMAX) 0.4 MG CAPS capsule Take 0.4 mg by mouth daily.  . traMADol (ULTRAM) 50 MG tablet Take 1-2 tablets (50-100 mg total) by mouth every 6 (six) hours as needed (mild pain).  Marland Kitchen zolpidem (AMBIEN) 10 MG tablet Take 10 mg by mouth at bedtime as needed.   No facility-administered encounter medications on file as of 05/07/2020.     REVIEW OF SYSTEMS: All other systems reviewed and negative except where noted in the History of Present Illness.  Gen: Denies fever, sweats or chills. No weight loss.  CV: Denies chest pain, palpitations or edema. Resp: Denies cough, shortness of breath of hemoptysis.  GI: Denies heartburn, dysphagia, stomach or lower abdominal pain. No diarrhea or constipation.  GU : Denies urinary burning, blood in urine, increased urinary frequency or incontinence. MS: Denies joint pain, muscles aches or weakness. Derm: Denies rash, itchiness, skin lesions or unhealing ulcers. Psych: Denies depression, anxiety, memory loss, suicidal ideation and confusion. Heme: Denies bruising, bleeding. Neuro:  Denies headaches, dizziness or paresthesias. Endo:  Denies any problems with DM, thyroid or adrenal function.    PHYSICAL EXAM: There were no vitals taken for this visit. General: Well developed ... in no acute distress. Head: Normocephalic and atraumatic. Eyes:  Sclerae non-icteric, conjunctive pink. Ears: Normal auditory acuity. Mouth: Dentition intact. No ulcers or lesions.  Neck: Supple, no lymphadenopathy or thyromegaly.  Lungs: Clear bilaterally to auscultation without wheezes, crackles or rhonchi. Heart: Regular rate and rhythm. No murmur, rub or gallop appreciated.   Abdomen: Soft, nontender, non distended. No masses. No hepatosplenomegaly. Normoactive bowel sounds x 4 quadrants.  Rectal:  Musculoskeletal: Symmetrical with no gross deformities. Skin: Warm and dry. No rash or lesions on visible extremities. Extremities: No edema. Neurological: Alert oriented x 4, no focal deficits.  Psychological:  Alert and cooperative. Normal mood and affect.  ASSESSMENT AND PLAN:  20. 64 year old male presents to schedule a screening colonoscopy. Colonoscopy in 2004 showed diverticulosis, no polyps. -Colonoscopy at [facility] due to EF 25 -30%. Colonoscopy benefits and risks discussed including risk with sedation, risk of bleeding, perforation and infection         CC:  Ralene Ok, MD

## 2020-05-18 ENCOUNTER — Encounter: Payer: BC Managed Care – PPO | Admitting: Gastroenterology

## 2020-05-26 ENCOUNTER — Other Ambulatory Visit: Payer: Self-pay | Admitting: Student

## 2020-05-26 DIAGNOSIS — I10 Essential (primary) hypertension: Secondary | ICD-10-CM

## 2020-05-26 DIAGNOSIS — I420 Dilated cardiomyopathy: Secondary | ICD-10-CM

## 2020-09-14 ENCOUNTER — Other Ambulatory Visit: Payer: Self-pay

## 2020-09-14 MED ORDER — SPIRONOLACTONE 25 MG PO TABS
25.0000 mg | ORAL_TABLET | Freq: Every day | ORAL | 1 refills | Status: DC
Start: 2020-09-14 — End: 2021-06-15

## 2020-09-19 NOTE — Progress Notes (Deleted)
Primary Physician/Referring:  Ralene Ok, MD  Patient ID: [name], male    DOB: [birth year], 65 y.o.   MRN: [id number]  No chief complaint on file.  HPI:    [name]  is a 65 y.o. male with history of hypercholesteremia, hypertension, OSA noncompliant with CPAP, and gout. 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  Patient had been seen in our office in September 2019 and was lost to follow up until 10/18/2021at which time patient was only taking Entresto and carvedilol once daily and was not compliant with CPAP.   ***Patient now presents for 6 month office visit. At last visit advised patient to take  [facility] and carvedilol twice daily and started spironolactone 12.5 mg daily, unfortunately repeat BMP was not done.   ***  He was last seen by Dr. Jacinto Halim 02/11/2018.  Last visit Sherryll Burger was increased to 97/23 mg and patient was started on Wellbutrin to assist in weight loss.  At last visit also discussed BiV ICD, patient preferred to wait at this time.  Patient was instructed to follow-up in 3 months, however he was unfortunately lost to follow-up.  Patient presents for follow-up today he is reportedly doing well and remains asymptomatic.  Denies chest pain, orthopnea, dizziness, syncope, shortness of breath, PND, swelling.  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  He does not have formal exercise routine.  He is currently only taking his Entresto and his carvedilol once daily.  Continues to eat high calorie diet.  Past Medical History:  Diagnosis Date  . ARTHRITIS   . Arthritis   . CHONDROMALACIA PATELLA, LEFT   . EXOGENOUS OBESITY   . GOUT   . HIP REPLACEMENT, BILATERAL, HX OF   . Hypertension   . Obstructive apnea    patient could not tolerate CPAP due to nasal airflow restriction.   . SOB (shortness of breath)    Past Surgical History:  Procedure Laterality Date  . JOINT REPLACEMENT    .  NASAL SINUS SURGERY     Dr . Haroldine Laws , October 2014 , nasal septum   . TOTAL HIP REVISION Right 03/03/2015   Procedure: RIGHT HIP BEARING SURFACE REVISION;  Surgeon: Ollen Gross, MD;  Location: [facility];  Service: Orthopedics;  Laterality: Right;   Family History  Problem Relation Age of Onset  . Heart disease Brother   . Hypertension Brother   . Hyperlipidemia Brother   . Ataxia Neg Hx   . Chorea Neg Hx   . Dementia Neg Hx   . Mental retardation Neg Hx   . Migraines Neg Hx   . Multiple sclerosis Neg Hx   . Neurofibromatosis Neg Hx   . Neuropathy Neg Hx   . Parkinsonism Neg Hx   . Seizures Neg Hx   . Stroke Neg Hx     Social History   Tobacco Use  . Smoking status: Never Smoker  . Smokeless tobacco: Never Used  Substance Use Topics  . Alcohol use: No   Marital Status: Single   ROS  Review of Systems  Constitutional: Negative for malaise/fatigue.  Cardiovascular: Negative for chest pain, claudication, dyspnea on exertion, leg swelling, near-syncope, orthopnea, palpitations, paroxysmal nocturnal dyspnea and syncope.  Hematologic/Lymphatic: Does not bruise/bleed easily.  Gastrointestinal: Negative for melena.    Objective  There were no vitals taken for this visit.  Vitals with BMI 03/22/2020 03/04/2015 03/04/2015  Height 5\' 7"  - -  Weight 231 lbs - -  BMI 36.17 - -  Systolic 104 119 702  Diastolic 66 74 74  Pulse 66 90 52   There is no height or weight on file to calculate BMI.    Physical Exam Vitals reviewed.  HENT:     Head: Normocephalic and atraumatic.  Cardiovascular:     Rate and Rhythm: Normal rate and regular rhythm.     Pulses: Intact distal pulses.          Carotid pulses are 2+ on the right side and 2+ on the left side.      Radial pulses are 2+ on the right side and 2+ on the left side.       Femoral pulses are 2+ on the right side and 2+ on the left side.      Popliteal pulses are 2+ on the right side and 2+ on the left side.       Dorsalis  pedis pulses are 2+ on the right side and 2+ on the left side.       Posterior tibial pulses are 2+ on the right side and 2+ on the left side.     Heart sounds: S1 normal and S2 normal. No murmur heard. No gallop.      Comments: No JVD.  Pulmonary:     Effort: Pulmonary effort is normal. No respiratory distress.     Breath sounds: No wheezing, rhonchi or rales.  Musculoskeletal:     Right lower leg: No edema.     Left lower leg: No edema.  Neurological:     Mental Status: He is alert.     Laboratory examination:   No results for input(s): NA, K, CL, CO2, GLUCOSE, BUN, CREATININE, CALCIUM, GFRNONAA, GFRAA in the last 8760 hours. CrCl cannot be calculated (Patient's most recent lab result is older than the maximum 21 days allowed.).  CMP Latest Ref Rng & Units 03/04/2015 02/22/2015 11/04/2008  Glucose 65 - 99 mg/dL 637(C) 90 90  BUN 6 - 20 mg/dL 17 58(I) 16  Creatinine 0.61 - 1.24 mg/dL 5.02 7.74 0.8  Sodium 128 - 145 mmol/L 135 133(L) 144  Potassium 3.5 - 5.1 mmol/L 4.7 4.2 4.2  Chloride 101 - 111 mmol/L 103 101 114(H)  CO2 22 - 32 mmol/L 26 24 28   Calcium 8.9 - 10.3 mg/dL ) 8.9 9.4  Total Protein 6.5 - 8.1 g/dL - 7.5 7.7  Total Bilirubin 0.3 - 1.2 mg/dL - 0.7 1.1  Alkaline Phos 38 - 126 U/L - 61 65  AST 15 - 41 U/L - 39 30  ALT 17 - 63 U/L - 46 41   CBC Latest Ref Rng & Units 03/04/2015 02/22/2015 11/04/2008  WBC 4.0 - 10.5 K/uL 11.9(H) 11.1(H) 7.0  Hemoglobin 13.0 - 17.0 g/dL 01/04/2009 76.7 20.9  Hematocrit 39.0 - 52.0 % 44.0 49.5 41.0  Platelets 150 - 400 K/uL 187 201 233.0    Lipid Panel No results for input(s): CHOL, TRIG, LDLCALC, VLDL, HDL, CHOLHDL, LDLDIRECT in the last 8760 hours.  HEMOGLOBIN A1C No results found for: HGBA1C, MPG TSH No results for input(s): TSH in the last 8760 hours.  External labs:  02/27/2020: Total cholesterol 174, triglycerides 72, HDL 62, LDL 98 Hemoglobin 02/29/2020, hematocrit 39.7, platelets 214, CBC otherwise within normal limits. Sodium 141,  potassium 4.1, BUN 23, creatinine 0.70, GFR 97, CMP otherwise normal  Medications and allergies  No Known Allergies   Outpatient Medications Prior to Visit  Medication Sig  Dispense Refill  . amphetamine-dextroamphetamine (ADDERALL) 20 MG tablet Take 20 mg by mouth daily.    Marland Kitchen aspirin 81 MG EC tablet Take 1 tablet (81 mg total) by mouth daily. 90 tablet 3  . carvedilol (COREG) 25 MG tablet TAKE 1 TABLET BY MOUTH TWICE A DAY 180 tablet 2  . colchicine 0.6 MG tablet Take 0.6 mg by mouth 2 (two) times daily as needed (gout).    Marland Kitchen diclofenac (VOLTAREN) 75 MG EC tablet Take 75 mg by mouth 2 (two) times daily.    Marland Kitchen ENTRESTO 97-103 MG Take 1 tablet by mouth 2 (two) times daily.    Marland Kitchen HYDROcodone-acetaminophen (NORCO/VICODIN) 5-325 MG tablet Take 1 tablet by mouth every 6 (six) hours as needed.    . indomethacin (INDOCIN) 50 MG capsule Take 50 mg by mouth 2 (two) times daily with a meal.    . methocarbamol (ROBAXIN) 500 MG tablet Take 1 tablet (500 mg total) by mouth every 6 (six) hours as needed for muscle spasms. 80 tablet 0  . oxyCODONE (OXY IR/ROXICODONE) 5 MG immediate release tablet Take 1-2 tablets (5-10 mg total) by mouth every 3 (three) hours as needed for moderate pain or severe pain. 80 tablet 0  . rosuvastatin (CRESTOR) 20 MG tablet Take 1 tablet (20 mg total) by mouth daily. 90 tablet 3  . spironolactone (ALDACTONE) 25 MG tablet Take 1 tablet (25 mg total) by mouth daily. 90 tablet 1  . tadalafil (CIALIS) 5 MG tablet Take 5 mg by mouth daily.    . tamsulosin (FLOMAX) 0.4 MG CAPS capsule Take 0.4 mg by mouth daily.    . traMADol (ULTRAM) 50 MG tablet Take 1-2 tablets (50-100 mg total) by mouth every 6 (six) hours as needed (mild pain). 60 tablet 1  . zolpidem (AMBIEN) 10 MG tablet Take 10 mg by mouth at bedtime as needed.     No facility-administered medications prior to visit.     Radiology:   No results found.  Cardiac Studies:  Echocardiogram 02/046/2020: Left ventricular  cavity is normal in size.  Moderate concentric hypertrophy of the LV.  Abnormal septal wall motion due to left bundle branch block.  Doppler evidence of grade 1 (impaired) diastolic dysfunction, normal LAP.  Calculated EF 26%.  Visual LVEF 25 to 30% Left atrial cavity is mildly dilated Moderate grade 3 aortic regurgitation Mild grade 1 mitral regurgitation Mild tricuspid regurgitation No evidence of pulmonary hypertension. Compared to previous study 04/18/2017, there is an interval increase in aortic regurgitation severity and minimal decrease in LV EF.  GXT 04/16/2017: Resting EKG demonstrates normal sinus rhythm, left bundle branch block.  Exercise capacity was normal.  Patient walked for 8: 30 minutes on Bruce protocol, achieved 10.1 METS and reached 85% THR. No arrhythmias no chest pain. Heart rate response to exercise: Appropriate.  BP response to exercise: Normal resting BP, exaggerated hypertensive response. ST changes: Evaluation for ischemia elevated in the setting of left bundle branch block. Stress test terminated due to fatigue. Normal exercise capacity for age.  Nondiagnostic treadmill stress test due to underlying left bundle branch block.  Clinical correlation rec  EKG:  EKG 03/22/2020: Sinus rhythm at a rate of 67 bpm with first-degree AV block.  Left atrial enlargement. Left bundle branch block, no further analysis.  Compared to EKG 02/11/2018, no significant change.   Assessment   No diagnosis found.   There are no discontinued medications.  No orders of the defined types were placed in this encounter.  Recommendations:   [name] is a 65 y.o. male with history of hypercholesteremia, hypertension, OSA non-compliant with CPAP, and gout. Followed by our office for nonischemic cardiomyopathy with reduced ejection fraction of 25 to 30%.   Patient had been seen in our office in September 2019 and was lost to follow up until 10/18/2021at which time patient was only  taking Entresto and carvedilol once daily and was not compliant with CPAP.   ***Patient now presents for 6 month office visit. At last visit advised patient to take [facility] and carvedilol twice daily and started spironolactone 12.5 mg daily, unfortunately repeat BMP was not done.   ***  ***  Patient presents for 1 year follow-up.  He remains asymptomatic without signs of acute heart failure and EKG is similar to previous with left bundle branch block.  Notably patient has only been taking his Entresto and his carvedilol once daily each.  Instructed patient to take carvedilol 25 mg twice daily and Entresto 97/103 mg twice daily.  In view of patient's reduced LVEF, will add spironolactone 12.5 mg once daily as his creatinine is <2.5 and potassium <5.0.  Will check BMP in 7 to 10 days.  Patient's blood pressure is currently well controlled.  Reviewed external labs from PCP, lipids are well controlled.  We will continue rosuvastatin 20 mg daily.  Patient reports he continues to take aspirin 81 mg daily, however reason for this is unclear as he has no history of CAD.  Will discuss with patient again at next visit, may consider stopping aspirin in the future.   Of note patient is not currently compliant with CPAP.  Encourage patient to follow-up with primary care provider for sleep provider to discuss strategies to improve patient compliance.  Weight loss was again discussed with patient.  He reports his PCP started him on a medication, the name of which he does not know, though will reportedly assist in weight loss.  Again stressed the importance of healthy diet and lifestyle, patient expressed understanding.  Patient had several questions regarding medical conditions, medications, and risks and benefits of treatment options.  Patient's questions and concerns were addressed.  Follow-up in 2 to 4 weeks via telephone.  Follow-up in office in 6 months.  During this visit I reviewed and updated: Tobacco  history  allergies medication reconciliation  medical history  surgical history  family history  social history.  This note was created using a voice recognition software as a result there may be grammatical errors inadvertently enclosed that do not reflect the nature of this encounter. Every attempt is made to correct such errors.   This was a 35-minute encounter with face-to-face counseling, medical records review, coordination of care, explanation of complex medical issues, complex medical decision making.     Rayford Halsted, PA-C 09/19/2020, 2:28 PM Office: [phone]

## 2020-09-20 ENCOUNTER — Ambulatory Visit: Payer: BC Managed Care – PPO | Admitting: Student

## 2020-09-20 DIAGNOSIS — I10 Essential (primary) hypertension: Secondary | ICD-10-CM

## 2020-09-20 DIAGNOSIS — I420 Dilated cardiomyopathy: Secondary | ICD-10-CM

## 2020-12-02 IMAGING — MR MRI LUMBAR SPINE WITHOUT CONTRAST
4 of 5 series · 23 of 48 positions shown · non-contrast
Comparison: None.

CLINICAL DATA: Low back pain with right hip and groin pain and
right leg weakness.

EXAM:
MRI LUMBAR SPINE WITHOUT CONTRAST
TECHNIQUE: Multiplanar, multisequence MR imaging of the lumbar spine was
performed. No intravenous contrast was administered.

[Series 2: T2 · sagittal · 4.0mm · 0.44mm/px · 6 of 12 slices shown (1 of 2)]
[im 1/12]
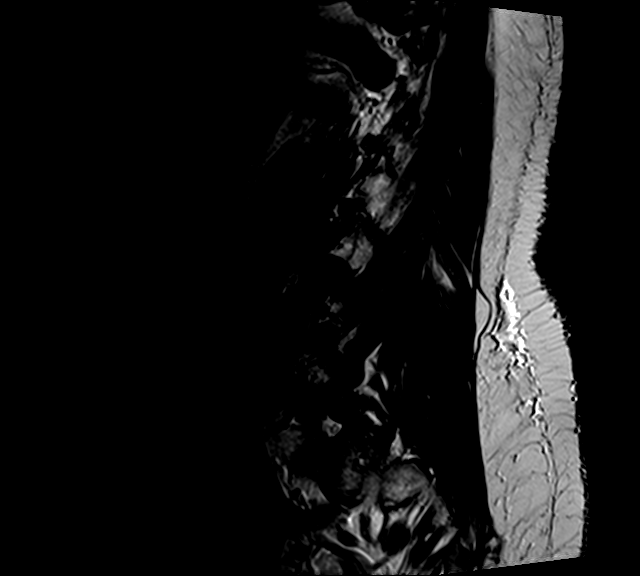
[im 3/12]
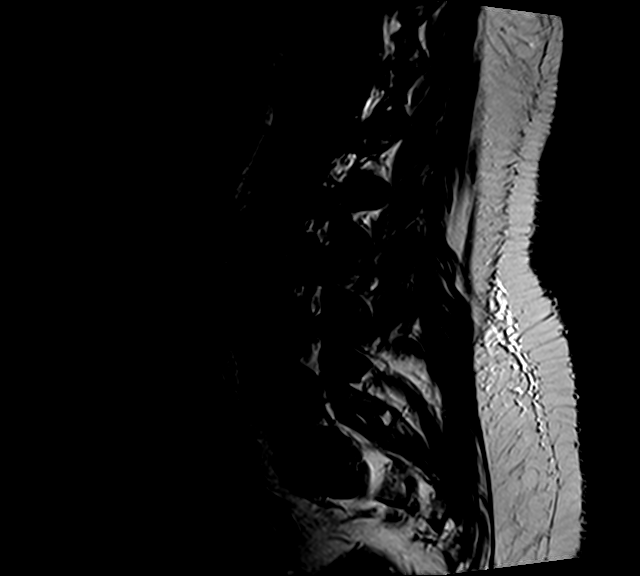
[im 5/12]
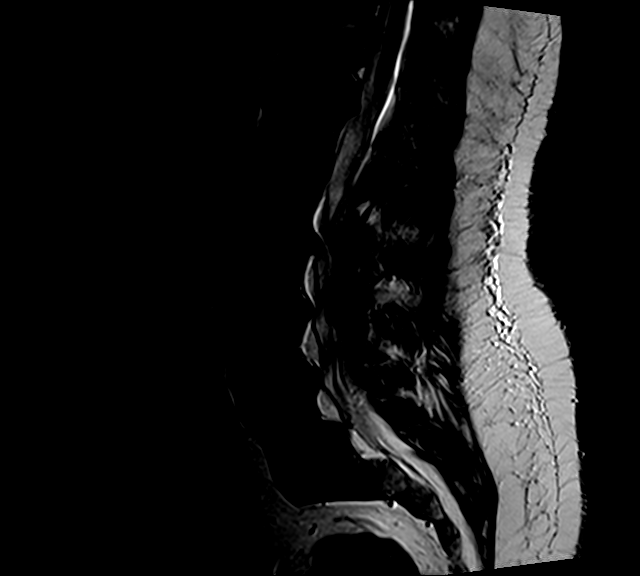
[im 7/12]
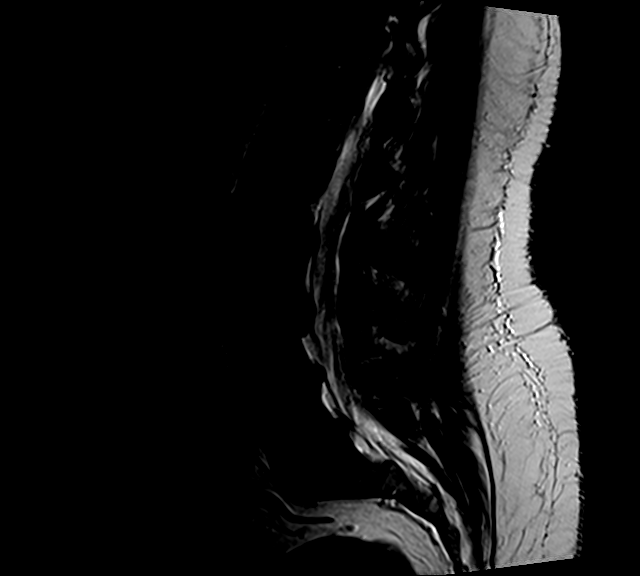
[im 9/12]
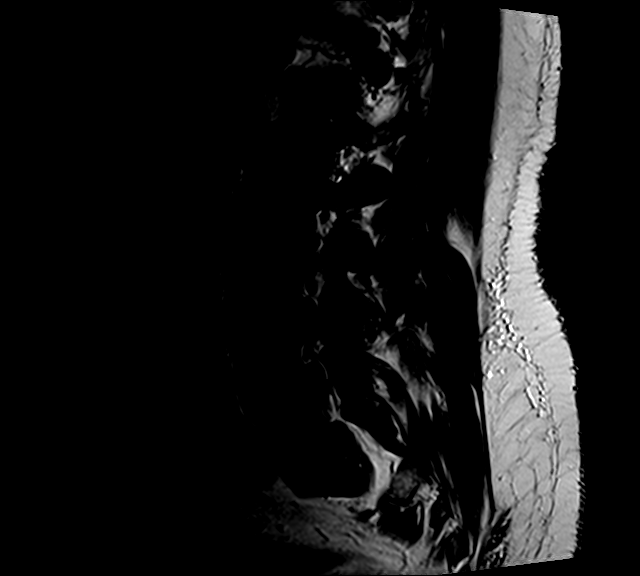
[im 12/12]
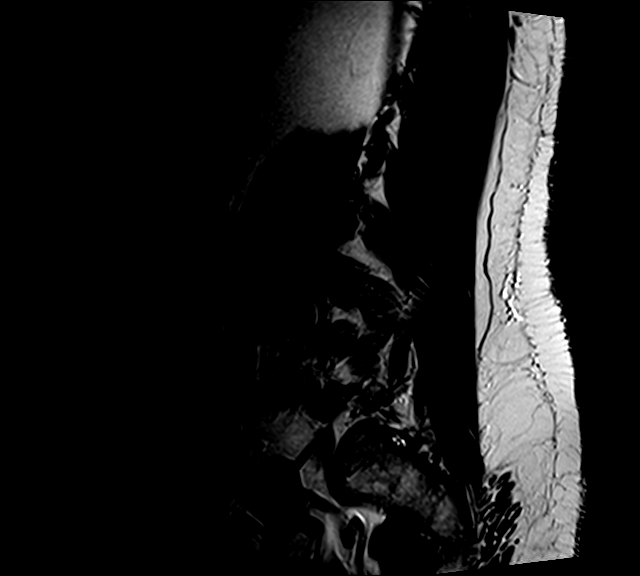

[Series 3: T1 · sagittal · 4.0mm · 0.55mm/px · 4 of 12 slices shown (1 of 2)]
[im 1/12]
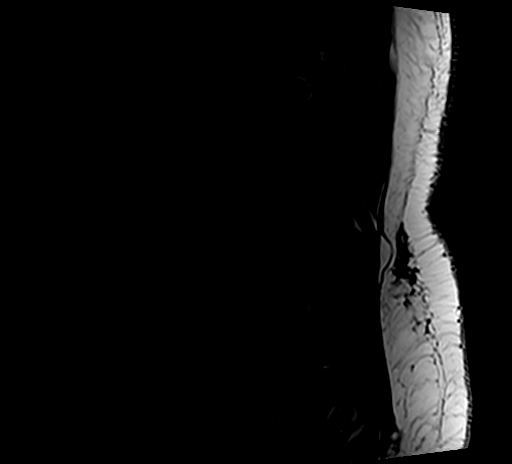
[im 3/12]
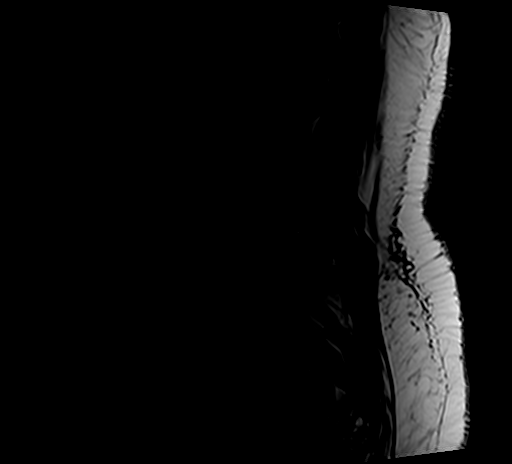
[im 6/12]
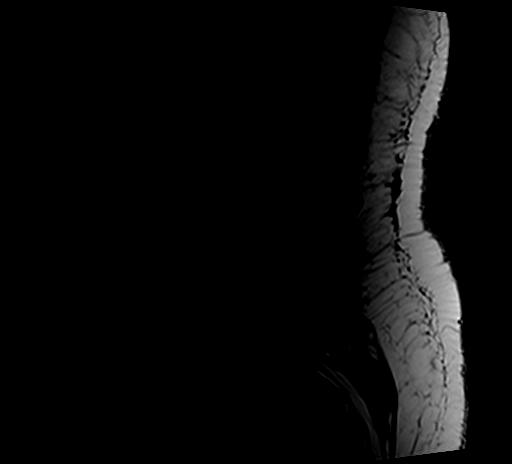
[im 12/12]
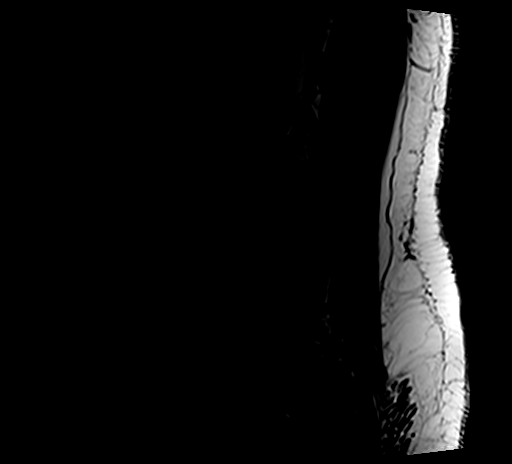

[Series 4: T1 · axial · 4.0mm · 0.37mm/px · z∈[-93,+81]mm · 3 of 35 slices shown (2 of 2)]
[im 5/35]
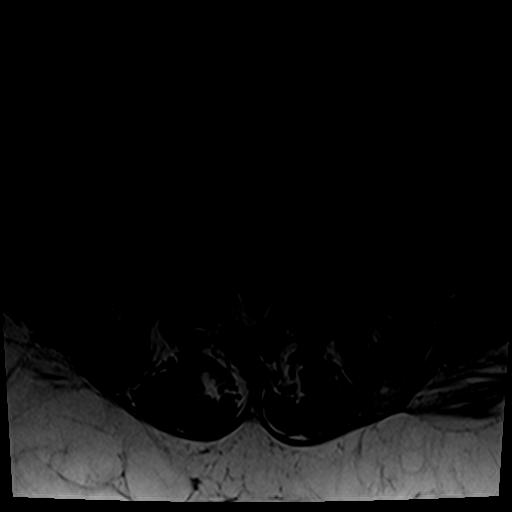
[im 19/35]
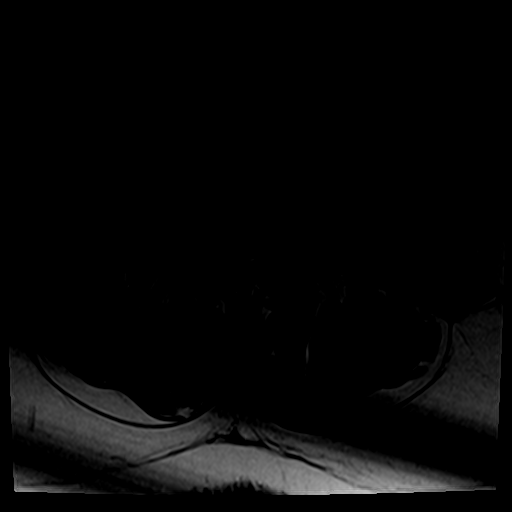
[im 30/35]
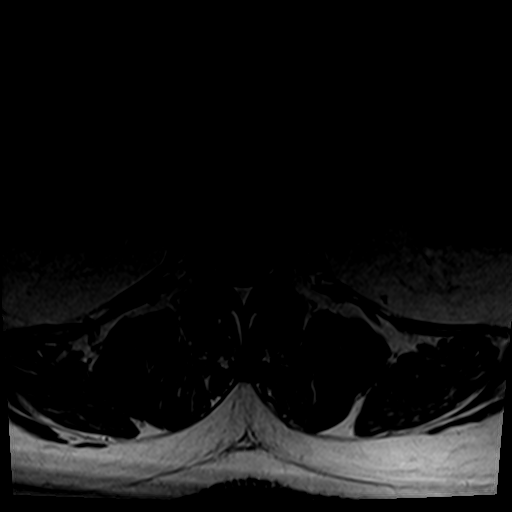

[Series 6: T2 · axial · 4.0mm · 0.74mm/px · z∈[-103,+119]mm · 10 of 35 slices shown (2 of 2)]
[im 3/35]
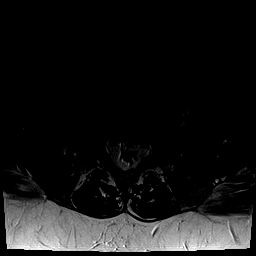
[im 5/35]
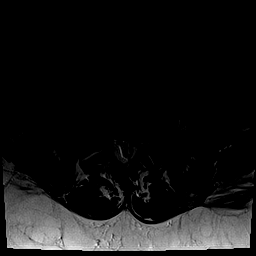
[im 7/35]
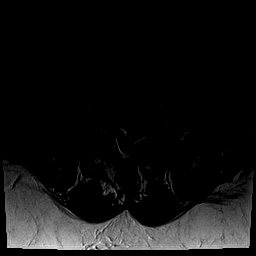
[im 12/35]
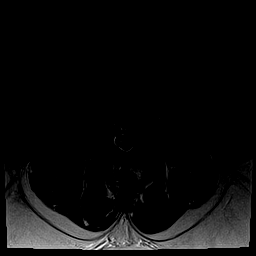
[im 16/35]
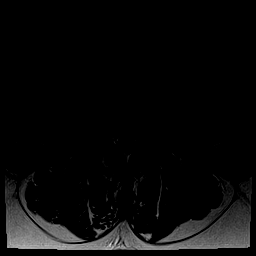
[im 19/35]
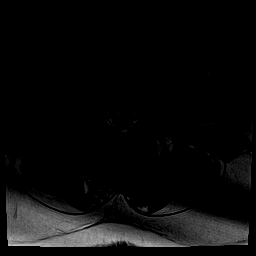
[im 21/35]
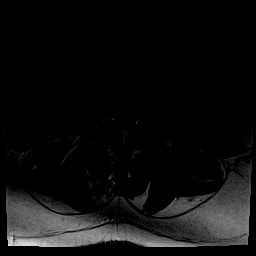
[im 25/35]
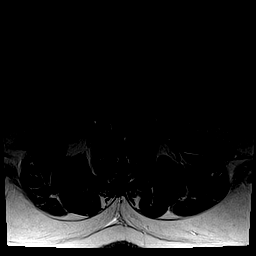
[im 30/35]
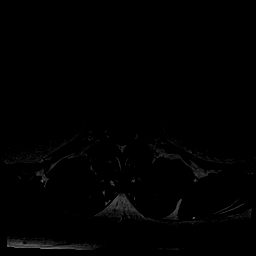
[im 35/35]
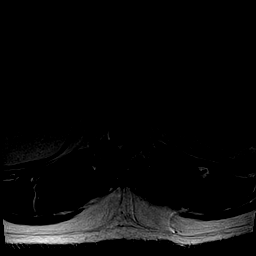

[23 of 48 positions shown; findings below may reference images not displayed]

FINDINGS: Segmentation:  5 lumbar type vertebral bodies.

Alignment:  Normal

Vertebrae: No fracture or primary bone lesion. Mild chronic
discogenic endplate marrow changes.

Conus medullaris and cauda equina: Conus extends to the L1 level.
Conus and cauda equina appear normal.

Paraspinal and other soft tissues: Negative

Disc levels:

T11-12: Endplate osteophytes and shallow disc protrusion in the left
posterolateral direction. No compressive stenosis.

T12-L1: Disc bulge.  No compressive stenosis.

L1-2: Normal interspace.

L2-3: Endplate osteophytes and bulging of the disc slightly more
prominent towards the right. Mild facet and ligamentous hypertrophy.
Mild narrowing of the right lateral recess without likely neural
compression.

L3-4: Endplate osteophytes and bulging of the disc. Mild facet and
ligamentous hypertrophy. Mild multifactorial stenosis of the canal
but without likely neural compression. Small left extraforaminal
disc protrusion not likely to compress the left L3 nerve.

L4-5: Endplate osteophytes and bulging of the disc more towards the
left. Facet and ligamentous hypertrophy. No central canal stenosis.
Left foraminal narrowing that would have some potential to irritate
the left L4 nerve.

L5-S1: Mild bulging of the disc. There is a small left paracentral
disc herniation which approaches the left S1 nerve but does not
appear to compress or displace it. Mild facet osteoarthritis with
small joint effusions. No canal or foraminal stenosis. The facet
degeneration could contribute to low back pain.
IMPRESSION: Multilevel degenerative changes but without dominant or definitely
symptomatic finding.

L2-3: Mild lateral recess narrowing right more than left but no
likely neural compression.

L3-4: Mild multifactorial stenosis without visible neural
compression. Small left extraforaminal disc protrusion would have
some potential to irritate the left L3 nerve, but does not compress
it

L4-5: Left foraminal encroachment by osteophyte in disc material
would have some potential to irritate the left L4 nerve.

L5-S1 facet osteoarthritis could contribute to back pain. Small left
posterolateral disc herniation approaches the left S1 nerve but does
not appear to compress or displace it

## 2021-04-06 ENCOUNTER — Other Ambulatory Visit: Payer: Self-pay | Admitting: Student

## 2021-04-25 ENCOUNTER — Other Ambulatory Visit: Payer: Self-pay | Admitting: Internal Medicine

## 2021-04-25 DIAGNOSIS — R3129 Other microscopic hematuria: Secondary | ICD-10-CM

## 2021-05-20 ENCOUNTER — Other Ambulatory Visit: Payer: BC Managed Care – PPO

## 2021-06-13 ENCOUNTER — Ambulatory Visit
Admission: RE | Admit: 2021-06-13 | Discharge: 2021-06-13 | Disposition: A | Discharge status: Home / Self Care | Payer: BC Managed Care – PPO | Source: Ambulatory Visit | Attending: Internal Medicine | Admitting: Internal Medicine

## 2021-06-13 ENCOUNTER — Other Ambulatory Visit: Payer: Self-pay | Admitting: Internal Medicine

## 2021-06-13 DIAGNOSIS — R3129 Other microscopic hematuria: Secondary | ICD-10-CM

## 2021-06-14 ENCOUNTER — Other Ambulatory Visit: Payer: Self-pay | Admitting: Student

## 2021-06-15 ENCOUNTER — Ambulatory Visit: Payer: BC Managed Care – PPO | Admitting: Student

## 2021-06-15 ENCOUNTER — Encounter: Payer: Self-pay | Admitting: Student

## 2021-06-15 VITALS — BP 134/83 | HR 69 | Temp 97.9°F | Ht 67.0 in | Wt 239.0 lb

## 2021-06-15 DIAGNOSIS — I5020 Unspecified systolic (congestive) heart failure: Secondary | ICD-10-CM

## 2021-06-15 DIAGNOSIS — I420 Dilated cardiomyopathy: Secondary | ICD-10-CM

## 2021-06-15 DIAGNOSIS — G4733 Obstructive sleep apnea (adult) (pediatric): Secondary | ICD-10-CM

## 2021-06-15 DIAGNOSIS — I447 Left bundle-branch block, unspecified: Secondary | ICD-10-CM

## 2021-06-15 DIAGNOSIS — I10 Essential (primary) hypertension: Secondary | ICD-10-CM

## 2021-06-15 MED ORDER — SPIRONOLACTONE 25 MG PO TABS: 25.0000 mg | ORAL_TABLET | Freq: Every day | ORAL | 1 refills | Status: DC

## 2021-06-15 NOTE — Progress Notes (Signed)
Primary Physician/Referring:  Jilda Panda, MD  Patient ID: [name], male    DOB: 01-May-1956, 66 y.o.   MRN: [id number]  Chief Complaint  Patient presents with   Hypertension   Cardiomyopathy   Follow-up   HPI:    [name]  is a 66 y.o. male with history of hypercholesteremia, hypertension, OSA noncompliant with CPAP, and gout. 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  Patient was last seen in our office 03/22/2020 at which time spironolactone was started and he was advised to follow-up in 4 to 6 weeks.  Patient has unfortunately been lost to follow-up until now.  Patient states he is feeling well overall without specific complaints.  Denies chest pain, dyspnea, orthopnea, PND, leg edema.  Patient has only been taking his Entresto and carvedilol once daily.  He has no formal exercise routine and admits to high calorie diet.  Patient also remains noncompliant with CPAP.  Notably in the past we have discussed BiV ICD, patient preferred to wait at this time.   Past Medical History:  Diagnosis Date   ARTHRITIS    Arthritis    CHONDROMALACIA PATELLA, LEFT    EXOGENOUS OBESITY    GOUT    HIP REPLACEMENT, BILATERAL, HX OF    Hypertension    Obstructive apnea    patient could not tolerate CPAP due to nasal airflow restriction.    SOB (shortness of breath)    Past Surgical History:  Procedure Laterality Date   JOINT REPLACEMENT     NASAL SINUS SURGERY     Dr . Ernesto Rutherford , October 2014 , nasal septum    TOTAL HIP REVISION Right 03/03/2015   Procedure: RIGHT HIP BEARING SURFACE REVISION;  Surgeon: Gaynelle Arabian, MD;  Location: [facility];  Service: Orthopedics;  Laterality: Right;   Family History  Problem Relation Age of Onset   Heart disease Brother    Hypertension Brother    Hyperlipidemia Brother    Ataxia Neg Hx    Chorea Neg Hx    Dementia Neg Hx    Mental retardation Neg Hx    Migraines Neg Hx     Multiple sclerosis Neg Hx    Neurofibromatosis Neg Hx    Neuropathy Neg Hx    Parkinsonism Neg Hx    Seizures Neg Hx    Stroke Neg Hx     Social History   Tobacco Use   Smoking status: Never   Smokeless tobacco: Never  Substance Use Topics   Alcohol use: No   Marital Status: Single   ROS  Review of Systems  Constitutional: Negative for malaise/fatigue.  Cardiovascular:  Negative for chest pain, claudication, dyspnea on exertion, leg swelling, near-syncope, orthopnea, palpitations, paroxysmal nocturnal dyspnea and syncope.  Hematologic/Lymphatic: Does not bruise/bleed easily.  Gastrointestinal:  Negative for melena.   Objective  Blood pressure 134/83, pulse 69, temperature 97.9 F (36.6 C), temperature source Temporal, height 5\' 7"  (1.702 m), weight 239 lb (108.4 kg).  Vitals with BMI 06/15/2021 03/22/2020 03/04/2015  Height 5\' 7"  5\' 7"  -  Weight 239 lbs 231 lbs -  BMI 99991111 0000000 -  Systolic [id number]  Diastolic 83 66 74  Pulse 69 66 90   Body mass index is 37.43 kg/m.    Physical Exam Vitals reviewed.  HENT:     Head: Normocephalic and atraumatic.  Cardiovascular:     Rate and Rhythm: Normal rate and regular rhythm.  Pulses: Intact distal pulses.          Carotid pulses are 2+ on the right side and 2+ on the left side.      Radial pulses are 2+ on the right side and 2+ on the left side.       Femoral pulses are 2+ on the right side and 2+ on the left side.      Popliteal pulses are 2+ on the right side and 2+ on the left side.       Dorsalis pedis pulses are 2+ on the right side and 2+ on the left side.       Posterior tibial pulses are 2+ on the right side and 2+ on the left side.     Heart sounds: S1 normal and S2 normal. No murmur heard.   No gallop.     Comments: No JVD.  Pulmonary:     Effort: Pulmonary effort is normal. No respiratory distress.     Breath sounds: No wheezing, rhonchi or rales.  Musculoskeletal:     Right lower leg: No edema.      Left lower leg: No edema.  Neurological:     Mental Status: He is alert.  Physical exam unchanged compared to previous office visit.  Laboratory examination:   No results for input(s): NA, K, CL, CO2, GLUCOSE, BUN, CREATININE, CALCIUM, GFRNONAA, GFRAA in the last 8760 hours. CrCl cannot be calculated (Patient's most recent lab result is older than the maximum 21 days allowed.).  CMP Latest Ref Rng & Units 03/04/2015 02/22/2015 11/04/2008  Glucose 65 - 99 mg/dL 151(H) 90 90  BUN 6 - 20 mg/dL 17 27(H) 16  Creatinine 0.61 - 1.24 mg/dL 0.83 0.87 0.8  Sodium 135 - 145 mmol/L 135 133(L) 144  Potassium 3.5 - 5.1 mmol/L 4.7 4.2 4.2  Chloride 101 - 111 mmol/L 103 101 114(H)  CO2 22 - 32 mmol/L 26 24 28   Calcium 8.9 - 10.3 mg/dL 8.6(L) 8.9 9.4  Total Protein 6.5 - 8.1 g/dL - 7.5 7.7  Total Bilirubin 0.3 - 1.2 mg/dL - 0.7 1.1  Alkaline Phos 38 - 126 U/L - 61 65  AST 15 - 41 U/L - 39 30  ALT 17 - 63 U/L - 46 41   CBC Latest Ref Rng & Units 03/04/2015 02/22/2015 11/04/2008  WBC 4.0 - 10.5 K/uL 11.9(H) 11.1(H) 7.0  Hemoglobin 13.0 - 17.0 g/dL 14.6 16.5 14.3  Hematocrit 39.0 - 52.0 % 44.0 49.5 41.0  Platelets 150 - 400 K/uL 187 201 233.0    Lipid Panel No results for input(s): CHOL, TRIG, LDLCALC, VLDL, HDL, CHOLHDL, LDLDIRECT in the last 8760 hours.  HEMOGLOBIN A1C No results found for: HGBA1C, MPG TSH No results for input(s): TSH in the last 8760 hours.  External labs:  02/27/2020: Total cholesterol 174, triglycerides 72, HDL 62, LDL 98 Hemoglobin 12.9, hematocrit 39.7, platelets 214, CBC otherwise within normal limits. Sodium 141, potassium 4.1, BUN 23, creatinine 0.70, GFR 97, CMP otherwise normal  Allergies  No Known Allergies   Medications Prior to Visit:   Outpatient Medications Prior to Visit  Medication Sig Dispense Refill   allopurinol (ZYLOPRIM) 100 MG tablet Take 100 mg by mouth daily.     amphetamine-dextroamphetamine (ADDERALL) 20 MG tablet Take 20 mg by mouth daily.      aspirin 81 MG EC tablet Take 1 tablet (81 mg total) by mouth daily. 90 tablet 3   carvedilol (COREG) 25 MG tablet TAKE 1 TABLET BY  MOUTH TWICE A DAY 180 tablet 2   colchicine 0.6 MG tablet Take 0.6 mg by mouth 2 (two) times daily as needed (gout).     ENTRESTO 97-103 MG Take 1 tablet by mouth 2 (two) times daily.     Multiple Vitamins-Minerals (MULTIVITAMIN WITH MINERALS) tablet Take 1 tablet by mouth daily.     rosuvastatin (CRESTOR) 20 MG tablet Take 1 tablet (20 mg total) by mouth daily. 90 tablet 3   tadalafil (CIALIS) 5 MG tablet Take 5 mg by mouth daily.     tamsulosin (FLOMAX) 0.4 MG CAPS capsule Take 0.4 mg by mouth daily.     testosterone cypionate (DEPOTESTOSTERONE CYPIONATE) 200 MG/ML injection Inject 200 mg into the muscle once a week.     Zinc Sulfate (ZINC 15 PO) Take by mouth.     zolpidem (AMBIEN) 10 MG tablet Take 10 mg by mouth at bedtime as needed.     spironolactone (ALDACTONE) 25 MG tablet Take 1 tablet (25 mg total) by mouth daily. 90 tablet 1   diclofenac (VOLTAREN) 75 MG EC tablet Take 75 mg by mouth 2 (two) times daily. (Patient not taking: Reported on 06/15/2021)     HYDROcodone-acetaminophen (NORCO/VICODIN) 5-325 MG tablet Take 1 tablet by mouth every 6 (six) hours as needed. (Patient not taking: Reported on 06/15/2021)     indomethacin (INDOCIN) 50 MG capsule Take 50 mg by mouth 2 (two) times daily with a meal. (Patient not taking: Reported on 06/15/2021)     methocarbamol (ROBAXIN) 500 MG tablet Take 1 tablet (500 mg total) by mouth every 6 (six) hours as needed for muscle spasms. 80 tablet 0   oxyCODONE (OXY IR/ROXICODONE) 5 MG immediate release tablet Take 1-2 tablets (5-10 mg total) by mouth every 3 (three) hours as needed for moderate pain or severe pain. 80 tablet 0   traMADol (ULTRAM) 50 MG tablet Take 1-2 tablets (50-100 mg total) by mouth every 6 (six) hours as needed (mild pain). 60 tablet 1   No facility-administered medications prior to visit.   Final  Medications at End of Visit    Current Meds  Medication Sig   allopurinol (ZYLOPRIM) 100 MG tablet Take 100 mg by mouth daily.   amphetamine-dextroamphetamine (ADDERALL) 20 MG tablet Take 20 mg by mouth daily.   aspirin 81 MG EC tablet Take 1 tablet (81 mg total) by mouth daily.   carvedilol (COREG) 25 MG tablet TAKE 1 TABLET BY MOUTH TWICE A DAY   colchicine 0.6 MG tablet Take 0.6 mg by mouth 2 (two) times daily as needed (gout).   ENTRESTO 97-103 MG Take 1 tablet by mouth 2 (two) times daily.   Multiple Vitamins-Minerals (MULTIVITAMIN WITH MINERALS) tablet Take 1 tablet by mouth daily.   rosuvastatin (CRESTOR) 20 MG tablet Take 1 tablet (20 mg total) by mouth daily.   tadalafil (CIALIS) 5 MG tablet Take 5 mg by mouth daily.   tamsulosin (FLOMAX) 0.4 MG CAPS capsule Take 0.4 mg by mouth daily.   testosterone cypionate (DEPOTESTOSTERONE CYPIONATE) 200 MG/ML injection Inject 200 mg into the muscle once a week.   Zinc Sulfate (ZINC 15 PO) Take by mouth.   zolpidem (AMBIEN) 10 MG tablet Take 10 mg by mouth at bedtime as needed.   [DISCONTINUED] spironolactone (ALDACTONE) 25 MG tablet Take 1 tablet (25 mg total) by mouth daily.   Radiology:   No results found.  Cardiac Studies:   Echocardiogram 02/046/2020: Left ventricular cavity is normal in size.  Moderate concentric hypertrophy  of the LV.  Abnormal septal wall motion due to left bundle branch block.  Doppler evidence of grade 1 (impaired) diastolic dysfunction, normal LAP.  Calculated EF 26%.  Visual LVEF 25 to 30% Left atrial cavity is mildly dilated Moderate grade 3 aortic regurgitation Mild grade 1 mitral regurgitation Mild tricuspid regurgitation No evidence of pulmonary hypertension. Compared to previous study 04/18/2017, there is an interval increase in aortic regurgitation severity and minimal decrease in LV EF.  GXT 04/16/2017: Resting EKG demonstrates normal sinus rhythm, left bundle branch block.  Exercise capacity was  normal.  Patient walked for 8: 30 minutes on Bruce protocol, achieved 10.1 METS and reached 85% THR. No arrhythmias no chest pain. Heart rate response to exercise: Appropriate.  BP response to exercise: Normal resting BP, exaggerated hypertensive response. ST changes: Evaluation for ischemia elevated in the setting of left bundle branch block. Stress test terminated due to fatigue. Normal exercise capacity for age.  Nondiagnostic treadmill stress test due to underlying left bundle branch block.  Clinical correlation rec  EKG:   06/15/2021: Sinus rhythm at 60 bpm.  Left bundle branch block, no further analysis.  Compared EKG 03/22/2020, no significant change.  Assessment     ICD-10-CM   1. Dilated cardiomyopathy (HCC)  I42.0 EKG 12-Lead    [facility] ECHOCARDIOGRAM COMPLETE    2. HFrEF (heart failure with reduced ejection fraction) (HCC)  I50.20     3. Essential hypertension  I10     4. Left bundle branch block  I44.7     5. Obstructive sleep apnea  G47.33        Medications Discontinued During This Encounter  Medication Reason   methocarbamol (ROBAXIN) 500 MG tablet    oxyCODONE (OXY IR/ROXICODONE) 5 MG immediate release tablet    traMADol (ULTRAM) 50 MG tablet    diclofenac (VOLTAREN) 75 MG EC tablet    HYDROcodone-acetaminophen (NORCO/VICODIN) 5-325 MG tablet    indomethacin (INDOCIN) 50 MG capsule    spironolactone (ALDACTONE) 25 MG tablet Reorder    Meds ordered this encounter  Medications   spironolactone (ALDACTONE) 25 MG tablet    Sig: Take 1 tablet (25 mg total) by mouth daily.    Dispense:  90 tablet    Refill:  1    Recommendations:   NORMAL GRESS is a 66 y.o. male with history of hypercholesteremia, hypertension, OSA non-compliant with CPAP, and gout. Followed by our office for nonischemic cardiomyopathy with reduced ejection fraction of 25 to 30%.  Patient was last seen in our office 03/22/2020 at which time spironolactone was started and he was advised to  follow-up in 4 to 6 weeks.  Patient has unfortunately been lost to follow-up until now.   Patient has only been taking Entresto and carvedilol once daily, despite being advised to take twice daily at last office visit.  He was also started on spironolactone at last office visit, however did not have repeat BMP done as ordered.  Patient does report PCP has recently done labs, I have requested these lab results be sent to our office for my review.  We will first review renal function, however if able we will increase Entresto to twice daily.  Patient will take carvedilol twice daily as well.  Patient is not presently on SGLT2 inhibitor, however if hemodynamics and renal function allows would recommend addition of SGLT2 inhibitor in the future.  It has been >2 years since patient's last echocardiogram, will therefore obtain repeat echo at this time.  We  will reevaluate need for BiV ICD pending results of echocardiogram.  On physical exam there is no clinical evidence of heart failure.  However discussed with patient at length the importance of treating underlying sleep apnea, medication compliance, and dietary compliance.  Also discussed importance of increasing physical activity and weight loss.  Follow-up in 4 weeks, sooner if needed, for HFrEF.   Alethia Berthold, PA-C 06/15/2021, 3:41 PM Office: [phone]

## 2021-06-16 ENCOUNTER — Ambulatory Visit
Admission: RE | Admit: 2021-06-16 | Discharge: 2021-06-16 | Disposition: A | Discharge status: Home / Self Care | Payer: BC Managed Care – PPO | Source: Ambulatory Visit | Attending: Internal Medicine | Admitting: Internal Medicine

## 2021-06-16 DIAGNOSIS — R3129 Other microscopic hematuria: Secondary | ICD-10-CM

## 2021-06-16 MED ORDER — IOPAMIDOL (ISOVUE-300) INJECTION 61%
125.0000 mL | Freq: Once | INTRAVENOUS | Status: AC | PRN
  Administered 2021-06-16: 125 mL via INTRAVENOUS

## 2021-06-20 NOTE — Progress Notes (Signed)
External labs 04/21/2021: Hgb 16.8, HCT 40.9, MCV 82.6, platelet 233 Sodium 139, potassium 4.5, BUN 20, creatinine 0.84, GFR >60 Total cholesterol 174, triglycerides 55, HDL 56, LDL 107

## 2021-06-28 ENCOUNTER — Ambulatory Visit: Payer: BC Managed Care – PPO

## 2021-06-28 ENCOUNTER — Other Ambulatory Visit: Payer: Self-pay

## 2021-06-28 DIAGNOSIS — I420 Dilated cardiomyopathy: Secondary | ICD-10-CM

## 2021-06-30 ENCOUNTER — Other Ambulatory Visit: Payer: BC Managed Care – PPO

## 2021-06-30 NOTE — Progress Notes (Signed)
Called and spoke with patient regarding his echocardiogram results. Patient is requesting a phone call, instead of coming in because he has to pay a co-pay of $80 because we are out-of network, and he said unless he absolutely has to be here, he would like for you to call him with details on echo results, because he just doesn't have it right now to pay. Please advise.

## 2021-06-30 NOTE — Progress Notes (Signed)
Echo is stable compared to previous in 2020, will discuss further at upcoming office visit.

## 2021-06-30 NOTE — Progress Notes (Signed)
Given medications changes and echo results he does need a follow up visit. We can do a telephone visit but it will still be billed to his insurance company so I don't think that will change his copay. So I would recommend keeping in office appt.   Devonna, is there anything we can do to help out in this situation?

## 2021-07-19 NOTE — Progress Notes (Unsigned)
Primary Physician/Referring:  Jilda Panda, MD  Patient ID: [name], male    DOB: Feb 24, 1956, 66 y.o.   MRN: [id number]  No chief complaint on file.  HPI:    [name]  is a 66 y.o. male with history of hypercholesteremia, hypertension, OSA noncompliant with CPAP, and gout. 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  Patient presents for 4-week follow-up.  At last office visit patient had previously been lost to follow-up and had stopped taking some of his medications.  Therefore advised him to take both Entresto and carvedilol twice daily as prescribed and restarted spironolactone.  Unfortunately repeat BMP has not been done since last office visit.  Also last office visit ordered echocardiogram which revealed LVEF of 25-30% with moderate AR, relatively unchanged compared to previous. ***  ***Start SGLT2? Need icd placement?   Patient was last seen in our office 03/22/2020 at which time spironolactone was started and he was advised to follow-up in 4 to 6 weeks.  Patient has unfortunately been lost to follow-up until now.  Patient states he is feeling well overall without specific complaints.  Denies chest pain, dyspnea, orthopnea, PND, leg edema.  Patient has only been taking his Entresto and carvedilol once daily.  He has no formal exercise routine and admits to high calorie diet.  Patient also remains noncompliant with CPAP.  Notably in the past we have discussed BiV ICD, patient preferred to wait at this time.   Past Medical History:  Diagnosis Date   ARTHRITIS    Arthritis    CHONDROMALACIA PATELLA, LEFT    EXOGENOUS OBESITY    GOUT    HIP REPLACEMENT, BILATERAL, HX OF    Hypertension    Obstructive apnea    patient could not tolerate CPAP due to nasal airflow restriction.    SOB (shortness of breath)    Past Surgical History:  Procedure Laterality Date   JOINT REPLACEMENT     NASAL SINUS SURGERY     Dr .  Ernesto Rutherford , October 2014 , nasal septum    TOTAL HIP REVISION Right 03/03/2015   Procedure: RIGHT HIP BEARING SURFACE REVISION;  Surgeon: Gaynelle Arabian, MD;  Location: [facility];  Service: Orthopedics;  Laterality: Right;   Family History  Problem Relation Age of Onset   Heart disease Brother    Hypertension Brother    Hyperlipidemia Brother    Ataxia Neg Hx    Chorea Neg Hx    Dementia Neg Hx    Mental retardation Neg Hx    Migraines Neg Hx    Multiple sclerosis Neg Hx    Neurofibromatosis Neg Hx    Neuropathy Neg Hx    Parkinsonism Neg Hx    Seizures Neg Hx    Stroke Neg Hx     Social History   Tobacco Use   Smoking status: Never   Smokeless tobacco: Never  Substance Use Topics   Alcohol use: No   Marital Status: Single   ROS  Review of Systems  Constitutional: Negative for malaise/fatigue.  Cardiovascular:  Negative for chest pain, claudication, dyspnea on exertion, leg swelling, near-syncope, orthopnea, palpitations, paroxysmal nocturnal dyspnea and syncope.  Hematologic/Lymphatic: Does not bruise/bleed easily.  Gastrointestinal:  Negative for melena.   Objective  There were no vitals taken for this visit.  Vitals with BMI 06/15/2021 03/22/2020 03/04/2015  Height 5\' 7"  5\' 7"  -  Weight 239 lbs 231 lbs -  BMI  99991111 0000000 -  Systolic [id number]  Diastolic 83 66 74  Pulse 69 66 90   There is no height or weight on file to calculate BMI.    Physical Exam Vitals reviewed.  HENT:     Head: Normocephalic and atraumatic.  Cardiovascular:     Rate and Rhythm: Normal rate and regular rhythm.     Pulses: Intact distal pulses.          Carotid pulses are 2+ on the right side and 2+ on the left side.      Radial pulses are 2+ on the right side and 2+ on the left side.       Femoral pulses are 2+ on the right side and 2+ on the left side.      Popliteal pulses are 2+ on the right side and 2+ on the left side.       Dorsalis pedis pulses are 2+ on the right side and 2+ on  the left side.       Posterior tibial pulses are 2+ on the right side and 2+ on the left side.     Heart sounds: S1 normal and S2 normal. No murmur heard.   No gallop.     Comments: No JVD.  Pulmonary:     Effort: Pulmonary effort is normal. No respiratory distress.     Breath sounds: No wheezing, rhonchi or rales.  Musculoskeletal:     Right lower leg: No edema.     Left lower leg: No edema.  Neurological:     Mental Status: He is alert.  Physical exam unchanged compared to previous office visit.  Laboratory examination:   No results for input(s): NA, K, CL, CO2, GLUCOSE, BUN, CREATININE, CALCIUM, GFRNONAA, GFRAA in the last 8760 hours. CrCl cannot be calculated (Patient's most recent lab result is older than the maximum 21 days allowed.).  CMP Latest Ref Rng & Units 03/04/2015 02/22/2015 11/04/2008  Glucose 65 - 99 mg/dL 151(H) 90 90  BUN 6 - 20 mg/dL 17 27(H) 16  Creatinine 0.61 - 1.24 mg/dL 0.83 0.87 0.8  Sodium 135 - 145 mmol/L 135 133(L) 144  Potassium 3.5 - 5.1 mmol/L 4.7 4.2 4.2  Chloride 101 - 111 mmol/L 103 101 114(H)  CO2 22 - 32 mmol/L 26 24 28   Calcium 8.9 - 10.3 mg/dL 8.6(L) 8.9 9.4  Total Protein 6.5 - 8.1 g/dL - 7.5 7.7  Total Bilirubin 0.3 - 1.2 mg/dL - 0.7 1.1  Alkaline Phos 38 - 126 U/L - 61 65  AST 15 - 41 U/L - 39 30  ALT 17 - 63 U/L - 46 41   CBC Latest Ref Rng & Units 03/04/2015 02/22/2015 11/04/2008  WBC 4.0 - 10.5 K/uL 11.9(H) 11.1(H) 7.0  Hemoglobin 13.0 - 17.0 g/dL 14.6 16.5 14.3  Hematocrit 39.0 - 52.0 % 44.0 49.5 41.0  Platelets 150 - 400 K/uL 187 201 233.0    Lipid Panel No results for input(s): CHOL, TRIG, LDLCALC, VLDL, HDL, CHOLHDL, LDLDIRECT in the last 8760 hours.  HEMOGLOBIN A1C No results found for: HGBA1C, MPG TSH No results for input(s): TSH in the last 8760 hours.  External labs:  02/27/2020: Total cholesterol 174, triglycerides 72, HDL 62, LDL 98 Hemoglobin 12.9, hematocrit 39.7, platelets 214, CBC otherwise within normal  limits. Sodium 141, potassium 4.1, BUN 23, creatinine 0.70, GFR 97, CMP otherwise normal  Allergies  No Known Allergies   Medications Prior to Visit:   Outpatient Medications Prior to  Visit  Medication Sig Dispense Refill   allopurinol (ZYLOPRIM) 100 MG tablet Take 100 mg by mouth daily.     amphetamine-dextroamphetamine (ADDERALL) 20 MG tablet Take 20 mg by mouth daily.     aspirin 81 MG EC tablet Take 1 tablet (81 mg total) by mouth daily. 90 tablet 3   carvedilol (COREG) 25 MG tablet TAKE 1 TABLET BY MOUTH TWICE A DAY 180 tablet 2   colchicine 0.6 MG tablet Take 0.6 mg by mouth 2 (two) times daily as needed (gout).     ENTRESTO 97-103 MG Take 1 tablet by mouth 2 (two) times daily.     Multiple Vitamins-Minerals (MULTIVITAMIN WITH MINERALS) tablet Take 1 tablet by mouth daily.     rosuvastatin (CRESTOR) 20 MG tablet Take 1 tablet (20 mg total) by mouth daily. 90 tablet 3   spironolactone (ALDACTONE) 25 MG tablet Take 1 tablet (25 mg total) by mouth daily. 90 tablet 1   tadalafil (CIALIS) 5 MG tablet Take 5 mg by mouth daily.     tamsulosin (FLOMAX) 0.4 MG CAPS capsule Take 0.4 mg by mouth daily.     testosterone cypionate (DEPOTESTOSTERONE CYPIONATE) 200 MG/ML injection Inject 200 mg into the muscle once a week.     Zinc Sulfate (ZINC 15 PO) Take by mouth.     zolpidem (AMBIEN) 10 MG tablet Take 10 mg by mouth at bedtime as needed.     No facility-administered medications prior to visit.   Final Medications at End of Visit    No outpatient medications have been marked as taking for the 07/21/21 encounter (Appointment) with Rayetta Pigg, Katalina Magri C, PA-C.   Radiology:   No results found.  Cardiac Studies:  [facility] ECHOCARDIOGRAM COMPLETE 06/28/2021 Left ventricle cavity is normal in size. Severe concentric hypertrophy of the left ventricle. Abnormal septal wall motion due to left bundle branch block. Severe global hypokinesis. LVEF 25-30%. Indeterminate diastolic filling  pattern. Trileaflet aortic valve. Mild aortic valve leaflet thickening. No evidence of aortic stenosis. Moderate (Grade III) aortic regurgitation. Compared to previous study in 2020, Mild MR, TR not appreciated. Otherwise, no significant change noted.   GXT 04/16/2017: Resting EKG demonstrates normal sinus rhythm, left bundle branch block.  Exercise capacity was normal.  Patient walked for 8: 30 minutes on Bruce protocol, achieved 10.1 METS and reached 85% THR. No arrhythmias no chest pain. Heart rate response to exercise: Appropriate.  BP response to exercise: Normal resting BP, exaggerated hypertensive response. ST changes: Evaluation for ischemia elevated in the setting of left bundle branch block. Stress test terminated due to fatigue. Normal exercise capacity for age.  Nondiagnostic treadmill stress test due to underlying left bundle branch block.  Clinical correlation rec  EKG:   06/15/2021: Sinus rhythm at 60 bpm.  Left bundle branch block, no further analysis.  Compared EKG 03/22/2020, no significant change.  Assessment   No diagnosis found.    There are no discontinued medications.   No orders of the defined types were placed in this encounter.   Recommendations:   [name] is a 66 y.o. male with history of hypercholesteremia, hypertension, OSA noncompliant with CPAP, and gout. 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  Patient presents for 4-week follow-up.  At last office visit patient had previously been lost to follow-up and had stopped taking some of his medications.  Therefore advised him to take both Entresto and carvedilol twice daily as prescribed and restarted spironolactone.  Unfortunately repeat BMP has not been done since last office visit.  Also last office visit ordered echocardiogram which revealed LVEF of 25-30% with moderate AR, relatively unchanged compared to previous. ***  ***Start SGLT2? Need  icd placement?   Patient was last seen in our office 03/22/2020 at which time spironolactone was started and he was advised to follow-up in 4 to 6 weeks.  Patient has unfortunately been lost to follow-up until now.   Patient has only been taking Entresto and carvedilol once daily, despite being advised to take twice daily at last office visit.  He was also started on spironolactone at last office visit, however did not have repeat BMP done as ordered.  Patient does report PCP has recently done labs, I have requested these lab results be sent to our office for my review.  We will first review renal function, however if able we will increase Entresto to twice daily.  Patient will take carvedilol twice daily as well.  Patient is not presently on SGLT2 inhibitor, however if hemodynamics and renal function allows would recommend addition of SGLT2 inhibitor in the future.  It has been >2 years since patient's last echocardiogram, will therefore obtain repeat echo at this time.  We will reevaluate need for BiV ICD pending results of echocardiogram.  On physical exam there is no clinical evidence of heart failure.  However discussed with patient at length the importance of treating underlying sleep apnea, medication compliance, and dietary compliance.  Also discussed importance of increasing physical activity and weight loss.  Follow-up in 4 weeks, sooner if needed, for HFrEF.   Alethia Berthold, PA-C 07/19/2021, 1:56 PM Office: [phone]

## 2021-07-21 ENCOUNTER — Ambulatory Visit: Payer: BC Managed Care – PPO | Admitting: Student

## 2021-07-21 DIAGNOSIS — I5020 Unspecified systolic (congestive) heart failure: Secondary | ICD-10-CM

## 2021-07-21 DIAGNOSIS — I420 Dilated cardiomyopathy: Secondary | ICD-10-CM

## 2021-12-11 ENCOUNTER — Other Ambulatory Visit: Payer: Self-pay | Admitting: Student

## 2021-12-13 ENCOUNTER — Ambulatory Visit: Payer: BC Managed Care – PPO | Admitting: Podiatry

## 2021-12-22 ENCOUNTER — Ambulatory Visit: Payer: BC Managed Care – PPO | Admitting: Podiatry

## 2021-12-22 DIAGNOSIS — M2142 Flat foot [pes planus] (acquired), left foot: Secondary | ICD-10-CM | POA: Diagnosis not present

## 2021-12-22 DIAGNOSIS — L600 Ingrowing nail: Secondary | ICD-10-CM | POA: Diagnosis not present

## 2021-12-22 DIAGNOSIS — M2141 Flat foot [pes planus] (acquired), right foot: Secondary | ICD-10-CM | POA: Diagnosis not present

## 2021-12-22 DIAGNOSIS — M722 Plantar fascial fibromatosis: Secondary | ICD-10-CM | POA: Diagnosis not present

## 2021-12-22 DIAGNOSIS — M79671 Pain in right foot: Secondary | ICD-10-CM

## 2021-12-22 NOTE — Progress Notes (Signed)
Subjective:   Patient ID: [name], male   DOB: 66 y.o.   MRN: [id number]   HPI Chief Complaint  Patient presents with   Ingrown Toenail    Pt came in today for bilateral hallux ingrown toe nails, Bilateral Borders, pt went to nail salon to have nails cut out, No drainage, NO pain, pt would like orthotics also,    66 year old male presents the office today requesting inserts.  He is on his feet a lot during the day and occasionally gets the discomfort.  Also had a history of ingrown toenails which have been tender previously but denies any swelling or redness or any drainage.  He is trim the nails himself.  Review of Systems  All other systems reviewed and are negative.  Past Medical History:  Diagnosis Date   ARTHRITIS    Arthritis    CHONDROMALACIA PATELLA, LEFT    EXOGENOUS OBESITY    GOUT    HIP REPLACEMENT, BILATERAL, HX OF    Hypertension    Obstructive apnea    patient could not tolerate CPAP due to nasal airflow restriction.    SOB (shortness of breath)     Past Surgical History:  Procedure Laterality Date   JOINT REPLACEMENT     NASAL SINUS SURGERY     Dr . Haroldine Laws , October 2014 , nasal septum    TOTAL HIP REVISION Right 03/03/2015   Procedure: RIGHT HIP BEARING SURFACE REVISION;  Surgeon: Ollen Gross, MD;  Location: [facility];  Service: Orthopedics;  Laterality: Right;     Current Outpatient Medications:    allopurinol (ZYLOPRIM) 100 MG tablet, Take 100 mg by mouth daily., Disp: , Rfl:    amphetamine-dextroamphetamine (ADDERALL) 20 MG tablet, Take 20 mg by mouth daily., Disp: , Rfl:    aspirin 81 MG EC tablet, Take 1 tablet (81 mg total) by mouth daily., Disp: 90 tablet, Rfl: 3   carvedilol (COREG) 25 MG tablet, TAKE 1 TABLET BY MOUTH TWICE A DAY, Disp: 180 tablet, Rfl: 2   colchicine 0.6 MG tablet, Take 0.6 mg by mouth 2 (two) times daily as needed (gout)., Disp: , Rfl:    ENTRESTO 97-103 MG, Take 1 tablet by mouth 2 (two) times daily., Disp: , Rfl:     Multiple Vitamins-Minerals (MULTIVITAMIN WITH MINERALS) tablet, Take 1 tablet by mouth daily., Disp: , Rfl:    rosuvastatin (CRESTOR) 20 MG tablet, Take 1 tablet (20 mg total) by mouth daily., Disp: 90 tablet, Rfl: 3   spironolactone (ALDACTONE) 25 MG tablet, TAKE 1 TABLET (25 MG TOTAL) BY MOUTH DAILY., Disp: 90 tablet, Rfl: 1   tadalafil (CIALIS) 5 MG tablet, Take 5 mg by mouth daily., Disp: , Rfl:    tamsulosin (FLOMAX) 0.4 MG CAPS capsule, Take 0.4 mg by mouth daily., Disp: , Rfl:    testosterone cypionate (DEPOTESTOSTERONE CYPIONATE) 200 MG/ML injection, Inject 200 mg into the muscle once a week., Disp: , Rfl:    Zinc Sulfate (ZINC 15 PO), Take by mouth., Disp: , Rfl:    zolpidem (AMBIEN) 10 MG tablet, Take 10 mg by mouth at bedtime as needed., Disp: , Rfl:   No Known Allergies        Objective:  Physical Exam  General: AAO x3, NAD  Dermatological: Mild incurvation of the hallux nails without any edema, erythema, drainage or pus any signs of infection.  No open lesions.  Vascular: Dorsalis Pedis artery and Posterior Tibial artery pedal pulses are 2/4 bilateral with immedate capillary  fill time. There is no pain with calf compression, swelling, warmth, erythema.   Neruologic: Grossly intact via light touch bilateral.   Musculoskeletal: Decreased metatarsal upon weightbearing.  Subsequently some discomfort on the plantar fascia but no area of tenderness identified today.  No area pinpoint tenderness.  Flexor, extensor tendons appear to be intact.  Muscular strength 5/5 in all groups tested bilateral.  Gait: Unassisted, Nonantalgic.      Assessment:   66 year old male with flatfoot, Plantar fasciitis; ingrown toenails without signs of infection     Plan:  -Treatment options discussed including all alternatives, risks, and complications. -Etiology of symptoms were discussed -He was measured for orthotics.  Check insurance coverage.  I do think that given his flat feet as well as  being on his feet all day at work this will be beneficial for him. -From the ingrown toenail perspective not causing any pain there is no signs of infection.  Discussed nail trimming techniques.  We discussed partial nail avulsion should symptoms continue will consider this.     Vivi Barrack DPM

## 2021-12-22 NOTE — Patient Instructions (Signed)
Ingrown Toenail  An ingrown toenail occurs when the corner or sides of a toenail grow into the surrounding skin. This causes discomfort and pain. The big toe is most commonly affected, but any of the toes can be affected. If an ingrown toenail is not treated, it can become infected. What are the causes? This condition may be caused by: Wearing shoes that are too small or tight. An injury, such as stubbing your toe or having your toe stepped on. Improper cutting or care of your toenails. Having nail or foot abnormalities that were present from birth (congenital abnormalities), such as having a nail that is too big for your toe. What increases the risk? The following factors may make you more likely to develop ingrown toenails: Age. Nails tend to get thicker with age, so ingrown nails are more common among older people. Cutting your toenails incorrectly, such as cutting them very short or cutting them unevenly. An ingrown toenail is more likely to get infected if you have: Diabetes. Blood flow (circulation) problems. What are the signs or symptoms? Symptoms of an ingrown toenail may include: Pain, soreness, or tenderness. Redness. Swelling. Hardening of the skin that surrounds the toenail. Signs that an ingrown toenail may be infected include: Fluid or pus. Symptoms that get worse. How is this diagnosed? Ingrown toenails may be diagnosed based on: Your symptoms and medical history. A physical exam. Labs or tests. If you have fluid or blood coming from your toenail, a sample may be collected to test for the specific type of bacteria that is causing the infection. How is this treated? Treatment depends on the severity of your symptoms. You may be able to care for your toenail at home. If you have an infection, you may be prescribed antibiotic medicines. If you have fluid or pus draining from your toenail, your health care provider may drain it. If you have trouble walking, you may be  given crutches to use. If you have a severe or infected ingrown toenail, you may need a procedure to remove part or all of the nail. Follow these instructions at home: Foot care  Check your wound every day for signs of infection, or as often as told by your health care provider. Check for: More redness, swelling, or pain. More fluid or blood. Warmth. Pus or a bad smell. Do not pick at your toenail or try to remove it yourself. Soak your foot in warm, soapy water. Do this for 20 minutes, 3 times a day, or as often as told by your health care provider. This helps to keep your toe clean and your skin soft. Wear shoes that fit well and are not too tight. Your health care provider may recommend that you wear open-toed shoes while you heal. Trim your toenails regularly and carefully. Cut your toenails straight across to prevent injury to the skin at the corners of the toenail. Do not cut your nails in a curved shape. Keep your feet clean and dry to help prevent infection. General instructions Take over-the-counter and prescription medicines only as told by your health care provider. If you were prescribed an antibiotic, take it as told by your health care provider. Do not stop taking the antibiotic even if you start to feel better. If your health care provider told you to use crutches to help you move around, use them as instructed. Return to your normal activities as told by your health care provider. Ask your health care provider what activities are safe for you.   Keep all follow-up visits. This is important. Contact a health care provider if: You have more redness, swelling, pain, or other symptoms that do not improve with treatment. You have fluid, blood, or pus coming from your toenail. You have a red streak on your skin that starts at your foot and spreads up your leg. You have a fever. Summary An ingrown toenail occurs when the corner or sides of a toenail grow into the surrounding skin.  This causes discomfort and pain. The big toe is most commonly affected, but any of the toes can be affected. If an ingrown toenail is not treated, it can become infected. Fluid or pus draining from your toenail is a sign of infection. Your health care provider may need to drain it. You may be given antibiotics to treat the infection. Trimming your toenails regularly and properly can help you prevent an ingrown toenail. This information is not intended to replace advice given to you by your health care provider. Make sure you discuss any questions you have with your health care provider. Document Revised: 09/21/2020 Document Reviewed: 09/21/2020 Elsevier Patient Education  2023 [employer]  

## 2022-01-13 ENCOUNTER — Telehealth: Payer: Self-pay | Admitting: Podiatry

## 2022-01-13 NOTE — Telephone Encounter (Signed)
Pt calling to check the status of insurance authorization for orthotics. He wants to know if insurance will cover this.  Please advise.

## 2022-02-01 ENCOUNTER — Other Ambulatory Visit: Payer: BC Managed Care – PPO

## 2022-02-01 ENCOUNTER — Telehealth: Payer: Self-pay | Admitting: *Deleted

## 2022-02-01 NOTE — Telephone Encounter (Signed)
I have left several messages.  My wife has the same insurance that I do.  She received two pair of orthotics and only had to pay $100.  I'd like to get two pair as well if that's the case.  Please let me know.

## 2022-02-02 ENCOUNTER — Ambulatory Visit (INDEPENDENT_AMBULATORY_CARE_PROVIDER_SITE_OTHER): Payer: BC Managed Care – PPO

## 2022-02-02 DIAGNOSIS — M2141 Flat foot [pes planus] (acquired), right foot: Secondary | ICD-10-CM

## 2022-02-02 DIAGNOSIS — M2142 Flat foot [pes planus] (acquired), left foot: Secondary | ICD-10-CM

## 2022-02-02 NOTE — Progress Notes (Signed)
Patient presents today to pick up custom molded foot orthotics, diagnosed with pes planus of both feet by Dr. Ardelle Anton.   Orthotics were dispensed and fit was satisfactory. Reviewed instructions for break-in and wear. Written instructions given to patient.  Patient will follow up as needed.   [name] - order # [id number]

## 2022-03-27 ENCOUNTER — Ambulatory Visit: Payer: BC Managed Care – PPO

## 2022-03-27 VITALS — BP 137/84 | HR 73 | Temp 97.8°F | Resp 16 | Ht 67.0 in | Wt 219.0 lb

## 2022-03-27 DIAGNOSIS — I10 Essential (primary) hypertension: Secondary | ICD-10-CM

## 2022-03-27 DIAGNOSIS — I5020 Unspecified systolic (congestive) heart failure: Secondary | ICD-10-CM

## 2022-03-27 DIAGNOSIS — I420 Dilated cardiomyopathy: Secondary | ICD-10-CM

## 2022-03-27 DIAGNOSIS — R55 Syncope and collapse: Secondary | ICD-10-CM

## 2022-03-27 MED ORDER — ENTRESTO 97-103 MG PO TABS: 1.0000 | ORAL_TABLET | Freq: Two times a day (BID) | ORAL | 3 refills | Status: DC
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
## 2022-03-28 NOTE — Addendum Note (Signed)
Addended by: Ernst Spell on: 03/28/2022 10:22 AM   Modules accepted: Level of Service

## 2022-04-11 ENCOUNTER — Other Ambulatory Visit: Payer: Self-pay

## 2022-04-11 MED ORDER — ENTRESTO 97-103 MG PO TABS: 1.0000 | ORAL_TABLET | Freq: Two times a day (BID) | ORAL | 3 refills | Status: DC

## 2022-04-20 ENCOUNTER — Telehealth (HOSPITAL_COMMUNITY): Payer: Self-pay | Admitting: Emergency Medicine

## 2022-04-20 NOTE — Telephone Encounter (Signed)
Reaching out to patient to offer assistance regarding upcoming cardiac imaging study; pt verbalizes understanding of appt date/time, parking situation and where to check in, pre-test NPO status and medications ordered, and verified current allergies; name and call back number provided for further questions should they arise Trayvon Trumbull RN Navigator Cardiac Imaging [facility] [phone] office [phone] cell 

## 2022-04-24 ENCOUNTER — Ambulatory Visit (HOSPITAL_COMMUNITY)
Admission: RE | Admit: 2022-04-24 | Discharge: 2022-04-24 | Disposition: A | Discharge status: Home / Self Care | Payer: BC Managed Care – PPO | Source: Ambulatory Visit

## 2022-04-24 DIAGNOSIS — R55 Syncope and collapse: Secondary | ICD-10-CM | POA: Diagnosis not present

## 2022-04-24 DIAGNOSIS — I420 Dilated cardiomyopathy: Secondary | ICD-10-CM | POA: Diagnosis present

## 2022-04-24 DIAGNOSIS — I5020 Unspecified systolic (congestive) heart failure: Secondary | ICD-10-CM | POA: Diagnosis not present

## 2022-04-24 DIAGNOSIS — Z538 Procedure and treatment not carried out for other reasons: Secondary | ICD-10-CM | POA: Diagnosis not present

## 2022-04-24 MED ORDER — DILTIAZEM HCL 25 MG/5ML IV SOLN
10.0000 mg | Freq: Once | INTRAVENOUS | Status: AC
  Administered 2022-04-24: 10 mg via INTRAVENOUS

## 2022-04-24 MED ORDER — NITROGLYCERIN 0.4 MG SL SUBL: 0.8000 mg | SUBLINGUAL_TABLET | Freq: Once | SUBLINGUAL | Status: DC

## 2022-04-24 MED ORDER — DILTIAZEM HCL 25 MG/5ML IV SOLN
INTRAVENOUS | Status: AC
  Administered 2022-04-24: 10 mg via INTRAVENOUS
  Filled 2022-04-24: qty 5

## 2022-04-24 MED ORDER — METOPROLOL TARTRATE 5 MG/5ML IV SOLN
INTRAVENOUS | Status: AC
  Filled 2022-04-24: qty 5

## 2022-04-24 MED ORDER — METOPROLOL TARTRATE 5 MG/5ML IV SOLN
10.0000 mg | Freq: Once | INTRAVENOUS | Status: AC
  Administered 2022-04-24: 10 mg via INTRAVENOUS

## 2022-04-24 MED ORDER — DILTIAZEM HCL 25 MG/5ML IV SOLN: 10.0000 mg | Freq: Once | INTRAVENOUS | Status: AC

## 2022-04-24 MED ORDER — METOPROLOL TARTRATE 5 MG/5ML IV SOLN: 10.0000 mg | Freq: Once | INTRAVENOUS | Status: AC

## 2022-04-24 MED ORDER — METOPROLOL TARTRATE 5 MG/5ML IV SOLN
INTRAVENOUS | Status: AC
  Administered 2022-04-24: 10 mg via INTRAVENOUS
  Filled 2022-04-24: qty 15

## 2022-04-24 NOTE — Progress Notes (Signed)
Patient arrived for Cardiac CT. Upon arrival patient heart rate 74-85. Too home medication Coreg 25mg  in am. Administered 20mg  metoprolol IV and cardizem 20mg  IV. Heart rate maintained  73-80. Spoke with Dr and to be rescheduled with Ivabradine. Notified Nurse navigator who will contact patient. Patient verbalized understanding with plan of care.

## 2022-04-26 NOTE — Progress Notes (Signed)
Tried calling patient n/a

## 2022-05-03 ENCOUNTER — Telehealth: Payer: Self-pay | Admitting: Cardiology

## 2022-05-03 DIAGNOSIS — I5020 Unspecified systolic (congestive) heart failure: Secondary | ICD-10-CM

## 2022-05-03 DIAGNOSIS — I420 Dilated cardiomyopathy: Secondary | ICD-10-CM

## 2022-05-03 DIAGNOSIS — I4729 Other ventricular tachycardia: Secondary | ICD-10-CM

## 2022-05-03 DIAGNOSIS — R55 Syncope and collapse: Secondary | ICD-10-CM

## 2022-05-03 NOTE — Telephone Encounter (Signed)
ICD-10-CM   1. NSVT (nonsustained ventricular tachycardia) (HCC)  I47.29 For home use only DME Vest life vest    2. Syncope and collapse  R55 For home use only DME Vest life vest    3. HFrEF (heart failure with reduced ejection fraction) (HCC)  I50.20 For home use only DME Vest life vest    Basic metabolic panel    CBC    4. Dilated cardiomyopathy (HCC)  I42.0 For home use only DME Vest life vest     1. Syncope and collapse Patient with unexplained syncope, severe LV systolic dysfunction, needs Left heart and right heart catheterization and referral to EP for BV ICD implantation.     2. NSVT (nonsustained ventricular tachycardia) (HCC) Discussed the findings and clinical presentation with Dr. Halford Chessman, EP regarding his presentation.  Concerning presentation and with NSVT happening during the daytime, his presentation being acute, he will arrange for a quick consultation and also possible ICD implantation.  He also recommended LifeVest.  Orders placed for LifeVest today.  3. HFrEF (heart failure with reduced ejection fraction) (HCC) Patient has cardiomyopathy, etiology of which is probably nonischemic however I was unable to obtain coronary CT angiogram, hence I will schedule him for left and right heart catheterization in the next 2 to 3 days.  Schedule for cardiac catheterization, and possible angioplasty. We discussed regarding risks, benefits, alternatives to this including stress testing, CTA and continued medical therapy. Patient wants to proceed. Understands <1-2% risk of death, stroke, MI, urgent CABG, bleeding, infection, renal failure but not limited to these.   4. Dilated cardiomyopathy (HCC) As dictated above, recent echocardiogram again reveals decreased LVEF.   Yates Decamp, MD, [facility] 05/03/2022, 5:15 PM Office: [phone] Fax: [phone] Pager: [phone]

## 2022-05-04 ENCOUNTER — Telehealth: Payer: Self-pay | Admitting: *Deleted

## 2022-05-04 ENCOUNTER — Encounter: Payer: Self-pay | Admitting: Cardiovascular Disease

## 2022-05-04 ENCOUNTER — Ambulatory Visit: Payer: BC Managed Care – PPO | Attending: Cardiovascular Disease | Admitting: Cardiovascular Disease

## 2022-05-04 VITALS — BP 110/60 | HR 70 | Ht 67.0 in | Wt 220.0 lb

## 2022-05-04 DIAGNOSIS — I5020 Unspecified systolic (congestive) heart failure: Secondary | ICD-10-CM

## 2022-05-04 LAB — CBC
Hematocrit: 47.7 % (ref 37.5–51.0)
Hemoglobin: 16.3 g/dL (ref 13.0–17.7)
MCH: 29.5 pg (ref 26.6–33.0)
MCHC: 34.2 g/dL (ref 31.5–35.7)
MCV: 86 fL (ref 79–97)
Platelets: 225 10*3/uL (ref 150–450)
RBC: 5.52 x10E6/uL (ref 4.14–5.80)
RDW: 14.8 % (ref 11.6–15.4)
WBC: 7.9 10*3/uL (ref 3.4–10.8)

## 2022-05-04 LAB — BASIC METABOLIC PANEL
BUN/Creatinine Ratio: 16 (ref 10–24)
BUN: 15 mg/dL (ref 8–27)
CO2: 28 mmol/L (ref 20–29)
Calcium: 10 mg/dL (ref 8.6–10.2)
Chloride: 102 mmol/L (ref 96–106)
Creatinine, Ser: 0.94 mg/dL (ref 0.76–1.27)
Glucose: 89 mg/dL (ref 70–99)
Potassium: 4.3 mmol/L (ref 3.5–5.2)
Sodium: 141 mmol/L (ref 134–144)
eGFR: 91 mL/min/{1.73_m2} (ref >59)
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
## 2022-05-04 NOTE — Telephone Encounter (Addendum)
Received call from [employer] rep, [employer]. Pt ordered a Technical sales engineer and she will need documentation to process order. TOC CM contacted pt to discuss Life Vest. Pt agreeable to providing [employer] with order and submitting through his insurance. States he has Cardiac Cath in am and will discuss with Dr Jacinto Halim. Confirmed with Dr Jacinto Halim, pt's cath is in the am. Will have office submit his cath report to [employer] rep.    Isidoro Donning RN3 CCM, Heart Failure TOC CM [phone]
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
## 2022-05-04 NOTE — Patient Instructions (Signed)
Medication Instructions:  Your physician recommends that you continue on your current medications as directed. Please refer to the Current Medication list given to you today.  *If you need a refill on your cardiac medications before your next appointment, please call your pharmacy*  Lab Work: BMET and CBC If you have labs (blood work) drawn today and your tests are completely normal, you will receive your results only by: MyChart Message (if you have MyChart) OR A paper copy in the mail If you have any lab test that is abnormal or we need to change your treatment, we will call you to review the results.  Testing/Procedures: Your physician has requested that you have an echocardiogram. Echocardiography is a painless test that uses sound waves to create images of your heart. It provides your doctor with information about the size and shape of your heart and how well your heart's chambers and valves are working. This procedure takes approximately one hour. There are no restrictions for this procedure. Please do NOT wear cologne, perfume, aftershave, or lotions (deodorant is allowed). Please arrive 15 minutes prior to your appointment time.  Your physician has recommended that you have a defibrillator inserted. An implantable cardioverter defibrillator (ICD) is a small device that is placed in your chest or, in rare cases, your abdomen. This device uses electrical pulses or shocks to help control life-threatening, irregular heartbeats that could lead the heart to suddenly stop beating (sudden cardiac arrest). Leads are attached to the ICD that goes into your heart. This is done in the hospital and usually requires an overnight stay. Please see the instruction sheet given to you today for more information.  Follow-Up: At [facility], you and your health needs are our priority.  As part of our continuing mission to provide you with exceptional heart care, we have created designated Provider  Care Teams.  These Care Teams include your primary Cardiologist (physician) and Advanced Practice Providers (APPs -  Physician Assistants and Nurse Practitioners) who all work together to provide you with the care you need, when you need it.  Your next appointment:   See instruction letter   Important Information About Sugar

## 2022-05-05 ENCOUNTER — Other Ambulatory Visit: Payer: Self-pay

## 2022-05-05 ENCOUNTER — Encounter (HOSPITAL_COMMUNITY): Payer: Self-pay | Admitting: Cardiology

## 2022-05-05 ENCOUNTER — Ambulatory Visit (HOSPITAL_COMMUNITY)
Admission: RE | Admit: 2022-05-05 | Discharge: 2022-05-05 | Disposition: A | Discharge status: Home / Self Care | Payer: BC Managed Care – PPO | Source: Ambulatory Visit | Attending: Cardiology | Admitting: Cardiology

## 2022-05-05 ENCOUNTER — Encounter (HOSPITAL_COMMUNITY)
Admission: RE | Disposition: A | Discharge status: Home / Self Care | Payer: Self-pay | Source: Ambulatory Visit | Attending: Cardiology

## 2022-05-05 DIAGNOSIS — E785 Hyperlipidemia, unspecified: Secondary | ICD-10-CM | POA: Diagnosis not present

## 2022-05-05 DIAGNOSIS — G4733 Obstructive sleep apnea (adult) (pediatric): Secondary | ICD-10-CM | POA: Diagnosis not present

## 2022-05-05 DIAGNOSIS — I447 Left bundle-branch block, unspecified: Secondary | ICD-10-CM | POA: Insufficient documentation

## 2022-05-05 DIAGNOSIS — I110 Hypertensive heart disease with heart failure: Secondary | ICD-10-CM | POA: Diagnosis not present

## 2022-05-05 DIAGNOSIS — I428 Other cardiomyopathies: Secondary | ICD-10-CM

## 2022-05-05 DIAGNOSIS — R0609 Other forms of dyspnea: Secondary | ICD-10-CM | POA: Insufficient documentation

## 2022-05-05 DIAGNOSIS — I5022 Chronic systolic (congestive) heart failure: Secondary | ICD-10-CM | POA: Diagnosis not present

## 2022-05-05 DIAGNOSIS — M109 Gout, unspecified: Secondary | ICD-10-CM | POA: Diagnosis not present

## 2022-05-05 DIAGNOSIS — I420 Dilated cardiomyopathy: Secondary | ICD-10-CM | POA: Insufficient documentation

## 2022-05-05 DIAGNOSIS — I472 Ventricular tachycardia, unspecified: Secondary | ICD-10-CM | POA: Diagnosis not present

## 2022-05-05 DIAGNOSIS — I4729 Other ventricular tachycardia: Secondary | ICD-10-CM

## 2022-05-05 DIAGNOSIS — R55 Syncope and collapse: Secondary | ICD-10-CM | POA: Insufficient documentation

## 2022-05-05 HISTORY — PX: RIGHT/LEFT HEART CATH AND CORONARY ANGIOGRAPHY: CATH118266

## 2022-05-05 LAB — POCT I-STAT 7, (LYTES, BLD GAS, ICA,H+H)
Acid-base deficit: 2 mmol/L (ref 0.0–2.0)
Bicarbonate: 23.5 mmol/L (ref 20.0–28.0)
Calcium, Ion: 1.29 mmol/L (ref 1.15–1.40)
HCT: 47 % (ref 39.0–52.0)
Hemoglobin: 16 g/dL (ref 13.0–17.0)
O2 Saturation: 96 %
Potassium: 4.5 mmol/L (ref 3.5–5.1)
Sodium: 137 mmol/L (ref 135–145)
TCO2: 25 mmol/L (ref 22–32)
pCO2 arterial: 41 mmHg (ref 32–48)
pH, Arterial: 7.367 (ref 7.35–7.45)
pO2, Arterial: 83 mmHg (ref 83–108)

## 2022-05-05 LAB — POCT I-STAT EG7
Acid-base deficit: 1 mmol/L (ref 0.0–2.0)
Bicarbonate: 26 mmol/L (ref 20.0–28.0)
Calcium, Ion: 1.31 mmol/L (ref 1.15–1.40)
HCT: 48 % (ref 39.0–52.0)
Hemoglobin: 16.3 g/dL (ref 13.0–17.0)
O2 Saturation: 69 %
Potassium: 4.4 mmol/L (ref 3.5–5.1)
Sodium: 138 mmol/L (ref 135–145)
TCO2: 28 mmol/L (ref 22–32)
pCO2, Ven: 50.2 mmHg (ref 44–60)
pH, Ven: 7.323 (ref 7.25–7.43)
pO2, Ven: 39 mmHg (ref 32–45)

## 2022-05-05 LAB — CARDIAC CATHETERIZATION: Cath EF Quantitative: 35 %

## 2022-05-05 SURGERY — RIGHT/LEFT HEART CATH AND CORONARY ANGIOGRAPHY
Anesthesia: LOCAL

## 2022-05-05 MED ORDER — VERAPAMIL HCL 2.5 MG/ML IV SOLN
INTRAVENOUS | Status: DC | PRN
  Administered 2022-05-05: 10 mL via INTRA_ARTERIAL

## 2022-05-05 MED ORDER — ACETAMINOPHEN 325 MG PO TABS: 650.0000 mg | ORAL_TABLET | ORAL | Status: DC | PRN

## 2022-05-05 MED ORDER — HEPARIN (PORCINE) IN NACL 1000-0.9 UT/500ML-% IV SOLN
INTRAVENOUS | Status: AC
  Filled 2022-05-05: qty 1000

## 2022-05-05 MED ORDER — HEPARIN SODIUM (PORCINE) 1000 UNIT/ML IJ SOLN
INTRAMUSCULAR | Status: DC | PRN
  Administered 2022-05-05: 2000 [IU] via INTRA_ARTERIAL
  Administered 2022-05-05: 3000 [IU] via INTRAVENOUS

## 2022-05-05 MED ORDER — SODIUM CHLORIDE 0.9 % IV SOLN: 250.0000 mL | INTRAVENOUS | Status: DC | PRN

## 2022-05-05 MED ORDER — MIDAZOLAM HCL 2 MG/2ML IJ SOLN
INTRAMUSCULAR | Status: DC | PRN
  Administered 2022-05-05: 2 mg via INTRAVENOUS

## 2022-05-05 MED ORDER — LIDOCAINE HCL (PF) 1 % IJ SOLN
INTRAMUSCULAR | Status: DC | PRN
  Administered 2022-05-05: 5 mL

## 2022-05-05 MED ORDER — LIDOCAINE HCL (PF) 1 % IJ SOLN
INTRAMUSCULAR | Status: AC
  Filled 2022-05-05: qty 30

## 2022-05-05 MED ORDER — ASPIRIN 81 MG PO CHEW
81.0000 mg | CHEWABLE_TABLET | ORAL | Status: AC
  Administered 2022-05-05: 81 mg via ORAL
  Filled 2022-05-05: qty 1

## 2022-05-05 MED ORDER — SODIUM CHLORIDE 0.9% FLUSH: 3.0000 mL | INTRAVENOUS | Status: DC | PRN

## 2022-05-05 MED ORDER — HEPARIN SODIUM (PORCINE) 1000 UNIT/ML IJ SOLN
INTRAMUSCULAR | Status: AC
  Filled 2022-05-05: qty 10

## 2022-05-05 MED ORDER — ASPIRIN 81 MG PO CHEW: 81.0000 mg | CHEWABLE_TABLET | ORAL | Status: DC

## 2022-05-05 MED ORDER — SODIUM CHLORIDE 0.9 % WEIGHT BASED INFUSION: 1.0000 mL/kg/h | INTRAVENOUS | Status: DC

## 2022-05-05 MED ORDER — FENTANYL CITRATE (PF) 100 MCG/2ML IJ SOLN
INTRAMUSCULAR | Status: DC | PRN
  Administered 2022-05-05: 50 ug via INTRAVENOUS

## 2022-05-05 MED ORDER — HEPARIN (PORCINE) IN NACL 1000-0.9 UT/500ML-% IV SOLN
INTRAVENOUS | Status: DC | PRN
  Administered 2022-05-05 (×2): 500 mL

## 2022-05-05 MED ORDER — FENTANYL CITRATE (PF) 100 MCG/2ML IJ SOLN
INTRAMUSCULAR | Status: AC
  Filled 2022-05-05: qty 2

## 2022-05-05 MED ORDER — MIDAZOLAM HCL 2 MG/2ML IJ SOLN
INTRAMUSCULAR | Status: AC
  Filled 2022-05-05: qty 2

## 2022-05-05 MED ORDER — SODIUM CHLORIDE 0.9% FLUSH: 3.0000 mL | Freq: Two times a day (BID) | INTRAVENOUS | Status: DC

## 2022-05-05 MED ORDER — IOHEXOL 350 MG/ML SOLN
INTRAVENOUS | Status: DC | PRN
  Administered 2022-05-05: 45 mL

## 2022-05-05 MED ORDER — ONDANSETRON HCL 4 MG/2ML IJ SOLN: 4.0000 mg | Freq: Four times a day (QID) | INTRAMUSCULAR | Status: DC | PRN

## 2022-05-05 MED ORDER — VERAPAMIL HCL 2.5 MG/ML IV SOLN
INTRAVENOUS | Status: AC
  Filled 2022-05-05: qty 2

## 2022-05-05 MED ORDER — SODIUM CHLORIDE 0.9 % IV SOLN: INTRAVENOUS | Status: DC

## 2022-05-05 SURGICAL SUPPLY — 12 items
CATH INFINITI JR4 5F (CATHETERS) IMPLANT
CATH OPTITORQUE TIG 4.0 5F (CATHETERS) IMPLANT
CATH SWAN GANZ 7F STRAIGHT (CATHETERS) IMPLANT
DEVICE RAD COMP TR BAND LRG (VASCULAR PRODUCTS) IMPLANT
GLIDESHEATH SLEND SS 6F .021 (SHEATH) IMPLANT
GLIDESHEATH SLENDER 7FR .021G (SHEATH) IMPLANT
GUIDEWIRE INQWIRE 1.5J.035X260 (WIRE) IMPLANT
INQWIRE 1.5J .035X260CM (WIRE) ×1
KIT HEART LEFT (KITS) ×2 IMPLANT
PACK CARDIAC CATHETERIZATION (CUSTOM PROCEDURE TRAY) ×2 IMPLANT
TRANSDUCER W/STOPCOCK (MISCELLANEOUS) ×2 IMPLANT
TUBING CIL FLEX 10 FLL-RA (TUBING) ×2 IMPLANT

## 2022-05-05 NOTE — Progress Notes (Signed)
Client's wife states client is supposed to get a life-vest, Dr Jacinto Halim called and per Dr Jacinto Halim okay to d/c home without life-vest
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
## 2022-05-05 NOTE — Interval H&P Note (Signed)
History and Physical Interval Note:  05/05/2022 9:33 AM  [name]  has presented today for surgery, with the diagnosis of nsvt, syncope.  The various methods of treatment have been discussed with the patient and family. After consideration of risks, benefits and other options for treatment, the patient has consented to  Procedure(s): RIGHT/LEFT HEART CATH AND CORONARY ANGIOGRAPHY (N/A) and possible angioplasty as a surgical intervention.  The patient's history has been reviewed, patient examined, no change in status, stable for surgery.  I have reviewed the patient's chart and labs.  Questions were answered to the patient's satisfaction.     Yates Decamp

## 2022-05-08 ENCOUNTER — Telehealth: Payer: Self-pay

## 2022-05-08 NOTE — Telephone Encounter (Signed)
Pt's spouse called stating that you wanted pt to have ECHO but there is no order. Please advise

## 2022-05-09 ENCOUNTER — Telehealth: Payer: Self-pay | Admitting: Cardiology

## 2022-05-09 ENCOUNTER — Telehealth: Payer: Self-pay | Admitting: Cardiovascular Disease

## 2022-05-09 DIAGNOSIS — I4729 Other ventricular tachycardia: Secondary | ICD-10-CM

## 2022-05-09 DIAGNOSIS — I5020 Unspecified systolic (congestive) heart failure: Secondary | ICD-10-CM

## 2022-05-09 NOTE — Telephone Encounter (Signed)
Wife returned RN's call. 

## 2022-05-09 NOTE — Telephone Encounter (Signed)
Left message for patient to call back. He does not have a DPR on file to speak with his wife.

## 2022-05-09 NOTE — Telephone Encounter (Signed)
Hi, were you able to cake care of this?

## 2022-05-09 NOTE — Telephone Encounter (Signed)
Pt's wife is requesting call back to discuss upcoming procedure.

## 2022-05-09 NOTE — Telephone Encounter (Signed)
Spoke with the patient who gave verbal permission to speak with his wife.   Patient also had several questions in regards to his procedure. He states that his wife has some additional questions and he would like for me to call her to discuss.   Spoke with the patient's wife and answered her questions as well in regards to post-procedure instructions and type of device the patient is getting. Patient's wife given number for device nurses to answer more specific questions in regards to the device. She verbalized understanding.

## 2022-05-09 NOTE — Telephone Encounter (Signed)
Patient's wife says you spoke with them about scheduling an echo here at our office instead of waiting until 05/25/22 at [facility] to do the echo. Can we get an order for the echo? I can call and set this up, just want to confirm.

## 2022-05-10 ENCOUNTER — Encounter: Payer: Self-pay | Admitting: Cardiovascular Disease

## 2022-05-10 ENCOUNTER — Other Ambulatory Visit: Payer: BC Managed Care – PPO

## 2022-05-12 ENCOUNTER — Ambulatory Visit: Payer: BC Managed Care – PPO

## 2022-05-12 DIAGNOSIS — I4729 Other ventricular tachycardia: Secondary | ICD-10-CM

## 2022-05-12 DIAGNOSIS — I5020 Unspecified systolic (congestive) heart failure: Secondary | ICD-10-CM

## 2022-05-13 NOTE — Progress Notes (Signed)
He wanted the echo done at our office.

## 2022-05-16 ENCOUNTER — Telehealth: Payer: Self-pay | Admitting: Cardiovascular Disease

## 2022-05-16 ENCOUNTER — Telehealth: Payer: Self-pay | Admitting: Cardiology

## 2022-05-16 NOTE — Telephone Encounter (Signed)
Pt called requesting to talk with EP team about plan for procedure.  I explained they will call him 05/17/22  he requests we call him if possible because it takes so long to get anyone to talk with at the office.    But if not he will call back.   Thanks.

## 2022-05-16 NOTE — Telephone Encounter (Signed)
Patient stated the results of his echo are completed and he has questions regarding his up coming surgery he would like to discuss

## 2022-05-16 NOTE — Telephone Encounter (Signed)
Left message for patient to call back  

## 2022-05-17 ENCOUNTER — Telehealth: Payer: Self-pay | Admitting: Podiatry

## 2022-05-17 NOTE — Telephone Encounter (Signed)
Left message to call back  

## 2022-05-17 NOTE — Telephone Encounter (Signed)
Patient is following up, again requesting a call back regarding 12/22 procedure with Dr. Nelly Laurence.

## 2022-05-17 NOTE — Telephone Encounter (Signed)
Spoke to pt. Pt concerned if he really needs to have this procedure after speaking with Dr Jacinto Halim. Pt aware Carly will speak with MD and let him know by end of week.  Informed that MD is in the hospital today and not in the office tomorrow so it may be Friday before hearing back. Pt agreeable.

## 2022-05-17 NOTE — Telephone Encounter (Signed)
See above phone note.  

## 2022-05-17 NOTE — Telephone Encounter (Signed)
Patient would like to order 2nd pair of orthotics at half price.

## 2022-05-17 NOTE — Telephone Encounter (Signed)
Patient would also like to have echo results reviewed if possible.

## 2022-05-19 NOTE — Telephone Encounter (Signed)
Pt returning call   Pt ask that you call work if mobile goes straight to [employer]

## 2022-05-19 NOTE — Telephone Encounter (Signed)
  Pt is returning call. Pt said, if carly can leave her direct number because he is tried playing phone tag or to let Craly know if he missed her call again that he will call back in 2 mins and will be on hold until she's available. Also, he said, he will drive in the office too

## 2022-05-19 NOTE — Telephone Encounter (Signed)
Left message for patient to call back  

## 2022-05-19 NOTE — Telephone Encounter (Signed)
Spoke with the patient and advised on Dr. Morrie Sheldon recommendations. Answered multiple questions for the patient in regards to his procedure.  Patient advised he could also contact [employer] to discuss additional questions in regards to the device and interference with other devices. Patient verbalized understanding and is okay to proceed.

## 2022-05-25 ENCOUNTER — Other Ambulatory Visit (HOSPITAL_COMMUNITY): Payer: BC Managed Care – PPO

## 2022-05-25 NOTE — Pre-Procedure Instructions (Signed)
Instructed patient on the following items: Arrival time 0830 Nothing to eat or drink after midnight No meds AM of procedure Responsible person to drive you home and stay with you for 24 hrs Wash with special soap night before and morning of procedure  

## 2022-05-26 ENCOUNTER — Ambulatory Visit (HOSPITAL_COMMUNITY): Payer: BC Managed Care – PPO

## 2022-05-26 ENCOUNTER — Encounter (HOSPITAL_COMMUNITY): Payer: Self-pay | Admitting: Certified Registered"

## 2022-05-26 ENCOUNTER — Other Ambulatory Visit: Payer: Self-pay

## 2022-05-26 ENCOUNTER — Encounter (HOSPITAL_COMMUNITY)
Admission: RE | Disposition: A | Discharge status: Home / Self Care | Payer: BC Managed Care – PPO | Source: Ambulatory Visit | Attending: Cardiovascular Disease

## 2022-05-26 ENCOUNTER — Ambulatory Visit (HOSPITAL_COMMUNITY)
Admission: RE | Admit: 2022-05-26 | Discharge: 2022-05-26 | Disposition: A | Discharge status: Home / Self Care | Payer: BC Managed Care – PPO | Source: Ambulatory Visit | Attending: Cardiovascular Disease | Admitting: Cardiovascular Disease

## 2022-05-26 DIAGNOSIS — Z79899 Other long term (current) drug therapy: Secondary | ICD-10-CM | POA: Diagnosis not present

## 2022-05-26 DIAGNOSIS — I5022 Chronic systolic (congestive) heart failure: Secondary | ICD-10-CM | POA: Insufficient documentation

## 2022-05-26 DIAGNOSIS — I447 Left bundle-branch block, unspecified: Secondary | ICD-10-CM | POA: Insufficient documentation

## 2022-05-26 DIAGNOSIS — I472 Ventricular tachycardia, unspecified: Secondary | ICD-10-CM | POA: Insufficient documentation

## 2022-05-26 DIAGNOSIS — R55 Syncope and collapse: Secondary | ICD-10-CM | POA: Insufficient documentation

## 2022-05-26 DIAGNOSIS — I110 Hypertensive heart disease with heart failure: Secondary | ICD-10-CM | POA: Insufficient documentation

## 2022-05-26 HISTORY — PX: BIV ICD INSERTION CRT-D: EP1195

## 2022-05-26 SURGERY — BIV ICD INSERTION CRT-D

## 2022-05-26 MED ORDER — MIDAZOLAM HCL 5 MG/5ML IJ SOLN
INTRAMUSCULAR | Status: AC
  Filled 2022-05-26: qty 5

## 2022-05-26 MED ORDER — SODIUM CHLORIDE 0.9 % IV SOLN
INTRAVENOUS | Status: AC
  Filled 2022-05-26: qty 2

## 2022-05-26 MED ORDER — IOHEXOL 350 MG/ML SOLN
INTRAVENOUS | Status: DC | PRN
  Administered 2022-05-26: 25 mL

## 2022-05-26 MED ORDER — ACETAMINOPHEN 325 MG PO TABS
325.0000 mg | ORAL_TABLET | ORAL | Status: DC | PRN
  Administered 2022-05-26: 650 mg via ORAL
  Filled 2022-05-26: qty 2

## 2022-05-26 MED ORDER — POVIDONE-IODINE 10 % EX SWAB: 2.0000 | Freq: Once | CUTANEOUS | Status: DC

## 2022-05-26 MED ORDER — LIDOCAINE HCL 1 % IJ SOLN
INTRAMUSCULAR | Status: AC
  Filled 2022-05-26: qty 60

## 2022-05-26 MED ORDER — MIDAZOLAM HCL 5 MG/5ML IJ SOLN
INTRAMUSCULAR | Status: DC | PRN
  Administered 2022-05-26 (×2): 2 mg via INTRAVENOUS

## 2022-05-26 MED ORDER — HEPARIN (PORCINE) IN NACL 1000-0.9 UT/500ML-% IV SOLN
INTRAVENOUS | Status: DC | PRN
  Administered 2022-05-26: 500 mL

## 2022-05-26 MED ORDER — CEFAZOLIN SODIUM-DEXTROSE 2-4 GM/100ML-% IV SOLN
2.0000 g | INTRAVENOUS | Status: AC
  Administered 2022-05-26: 2 g via INTRAVENOUS

## 2022-05-26 MED ORDER — SODIUM CHLORIDE 0.9 % IV SOLN
80.0000 mg | INTRAVENOUS | Status: AC
  Administered 2022-05-26: 80 mg

## 2022-05-26 MED ORDER — ONDANSETRON HCL 4 MG/2ML IJ SOLN: 4.0000 mg | Freq: Four times a day (QID) | INTRAMUSCULAR | Status: DC | PRN

## 2022-05-26 MED ORDER — CEFAZOLIN SODIUM-DEXTROSE 2-4 GM/100ML-% IV SOLN
INTRAVENOUS | Status: AC
  Filled 2022-05-26: qty 100

## 2022-05-26 MED ORDER — FENTANYL CITRATE (PF) 100 MCG/2ML IJ SOLN
INTRAMUSCULAR | Status: DC | PRN
  Administered 2022-05-26 (×2): 50 ug via INTRAVENOUS

## 2022-05-26 MED ORDER — FENTANYL CITRATE (PF) 100 MCG/2ML IJ SOLN
INTRAMUSCULAR | Status: AC
  Filled 2022-05-26: qty 2

## 2022-05-26 MED ORDER — SODIUM CHLORIDE 0.9 % IV SOLN: INTRAVENOUS | Status: DC

## 2022-05-26 MED ORDER — LIDOCAINE HCL (PF) 1 % IJ SOLN
INTRAMUSCULAR | Status: DC | PRN
  Administered 2022-05-26: 60 mL

## 2022-05-26 SURGICAL SUPPLY — 23 items
BALLN COR SINUS VENO 6FR 80 (BALLOONS) ×1
BALLOON COR SINUS VENO 6FR 80 (BALLOONS) IMPLANT
CABLE SURGICAL S-101-97-12 (CABLE) ×2 IMPLANT
CATH CPS DIRECT 135 DS2C020 (CATHETERS) IMPLANT
CATH CPS DIRECT WD DS2C028 (CATHETERS) IMPLANT
CATH CPS QUART CN DS2N029-65 (CATHETERS) IMPLANT
CATH JOSEPH QUAD ALLRED 6F REP (CATHETERS) IMPLANT
GUIDEWIRE ANGLED .035X150CM (WIRE) IMPLANT
ICD GALLANT HFCRTD CDHFA500Q (ICD Generator) IMPLANT
KIT ESSENTIALS PG (KITS) IMPLANT
KIT MICROPUNCTURE NIT STIFF (SHEATH) IMPLANT
LEAD DURATA 7122Q-65CM (Lead) IMPLANT
LEAD QUARTET 1458QL-86 (Lead) IMPLANT
LEAD ULTIPACE 52 LPA1231/52 (Lead) IMPLANT
PAD DEFIB RADIO PHYSIO CONN (PAD) ×2 IMPLANT
QUARTET 1458QL-86 (Lead) ×1 IMPLANT
SHEATH 7FR PRELUDE SNAP 13 (SHEATH) IMPLANT
SHEATH 9.5FR PRELUDE SNAP 13 (SHEATH) IMPLANT
SHEATH WORLEY 9FR 62CM (SHEATH) IMPLANT
SLITTER AGILIS HISPRO (INSTRUMENTS) IMPLANT
TRAY PACEMAKER INSERTION (PACKS) ×2 IMPLANT
WIRE ACUITY WHISPER EDS 4648 (WIRE) IMPLANT
WIRE HI TORQ VERSACORE-J 145CM (WIRE) IMPLANT

## 2022-05-26 NOTE — Discharge Instructions (Addendum)
After Your ICD (Implantable Cardiac Defibrillator)   You have a [employer] ICD  ACTIVITY Do not lift your arm above shoulder height for 1 week after your procedure. After 7 days, you may progress as below.  You should remove your sling 24 hours after your procedure, unless otherwise instructed by your provider.     Friday June 02, 2022  Saturday June 03, 2022 Sunday June 04, 2022 Monday June 05, 2022   Do not lift, push, pull, or carry anything over 10 pounds with the affected arm until 6 weeks (Friday July 07, 2022 ) after your procedure.   You may drive AFTER your wound check, unless you have been told otherwise by your provider.   Ask your healthcare provider when you can go back to work   INCISION/Dressing If you are on a blood thinner such as Coumadin, Xarelto, Eliquis, Plavix, or Pradaxa please confirm with your provider when this should be resumed.   If large square, outer bandage is left in place, this can be removed after 24 hours from your procedure. Do not remove steri-strips or glue as below.   Monitor your defibrillator site for redness, swelling, and drainage. Call the device clinic at [phone] if you experience these symptoms or fever/chills.  If your incision is sealed with Steri-strips or staples, you may shower 7 days after your procedure or when told by your provider. Do not remove the steri-strips or let the shower hit directly on your site. You may wash around your site with soap and water.    If you were discharged in a sling, please do not wear this during the day more than 48 hours after your surgery unless otherwise instructed. This may increase the risk of stiffness and soreness in your shoulder.   Avoid lotions, ointments, or perfumes over your incision until it is well-healed.  You may use a hot tub or a pool AFTER your wound check appointment if the incision is completely closed.  Your ICD is designed to protect you from life  threatening heart rhythms. Because of this, you may receive a shock.   1 shock with no symptoms:  Call the office during business hours. 1 shock with symptoms (chest pain, chest pressure, dizziness, lightheadedness, shortness of breath, overall feeling unwell):  Call 911. If you experience 2 or more shocks in 24 hours:  Call 911. If you receive a shock, you should not drive for 6 months per the [place] DMV IF you receive appropriate therapy from your ICD.   ICD Alerts:  Some alerts are vibratory and others beep. These are NOT emergencies. Please call our office to let us know. If this occurs at night or on weekends, it can wait until the next business day. Send a remote transmission.  If your device is capable of reading fluid status (for heart failure), you will be offered monthly monitoring to review this with you.   DEVICE MANAGEMENT Remote monitoring is used to monitor your ICD from home. This monitoring is scheduled every 91 days by our office. It allows [place] to keep an eye on the functioning of your device to ensure it is working properly. You will routinely see your Electrophysiologist annually (more often if necessary).   You should receive your ID card for your new device in 4-8 weeks. Keep this card with you at all times once received. Consider wearing a medical alert bracelet or necklace.  Your ICD  may be MRI compatible. This will be discussed at your next  office visit/wound check.  You should avoid contact with strong electric or magnetic fields.   Do not use amateur (ham) radio equipment or electric (arc) welding torches. MP3 player headphones with magnets should not be used. Some devices are safe to use if held at least 12 inches (30 cm) from your defibrillator. These include power tools, lawn mowers, and speakers. If you are unsure if something is safe to use, ask your health care provider.  When using your cell phone, hold it to the ear that is on the opposite side from the  defibrillator. Do not leave your cell phone in a pocket over the defibrillator.  You may safely use electric blankets, heating pads, computers, and microwave ovens.  Call the office right away if: You have chest pain. You feel more than one shock. You feel more short of breath than you have felt before. You feel more light-headed than you have felt before. Your incision starts to open up.  This information is not intended to replace advice given to you by your health care provider. Make sure you discuss any questions you have with your health care provider.

## 2022-05-26 NOTE — Progress Notes (Signed)
Assumed care of pt from nurse Maureen Ralphs, RN

## 2022-05-26 NOTE — Progress Notes (Signed)
After a repeat CXR and the MD speaking to pt at bedside, the decision was made to d/c pt home today. D/c instructions were given by off-going nurse Maureen Ralphs, RN) prior to me taking over, and re-educated at time of d/c. Pt is in stable condition and denies any acute pain or discomfort. A new AVS was printed and given to pt. PIV removed and intact. VSS.

## 2022-05-26 NOTE — Progress Notes (Signed)
Patient and wife was given discharge instructions. Both verbalized understanding. 

## 2022-05-26 NOTE — Interval H&P Note (Signed)
History and Physical Interval Note:  05/26/2022 9:45 AM  [name]  has presented today for surgery, with the diagnosis of chf.  The various methods of treatment have been discussed with the patient and family. After consideration of risks, benefits and other options for treatment, the patient has consented to  Procedure(s): BIV ICD INSERTION CRT-D (N/A) as a surgical intervention.  The patient's history has been reviewed, patient examined, no change in status, stable for surgery.  I have reviewed the patient's chart and labs.  Questions were answered to the patient's satisfaction.     Roberts Gaudy [name]

## 2022-05-27 ENCOUNTER — Telehealth: Payer: Self-pay | Admitting: Cardiology

## 2022-05-27 DIAGNOSIS — G8918 Other acute postprocedural pain: Secondary | ICD-10-CM

## 2022-05-27 MED ORDER — CYCLOBENZAPRINE HCL 10 MG PO TABS: 10.0000 mg | ORAL_TABLET | Freq: Three times a day (TID) | ORAL | 0 refills | Status: DC | PRN

## 2022-05-27 MED ORDER — OXYCODONE-ACETAMINOPHEN 10-325 MG PO TABS: 1.0000 | ORAL_TABLET | ORAL | 0 refills | Status: DC | PRN

## 2022-05-27 NOTE — Telephone Encounter (Signed)
    ICD-10-CM   1. Post-operative pain  G89.18 cyclobenzaprine (FLEXERIL) 10 MG tablet    oxyCODONE-acetaminophen (PERCOCET) 10-325 MG tablet     Meds ordered this encounter  Medications   cyclobenzaprine (FLEXERIL) 10 MG tablet    Sig: Take 1 tablet (10 mg total) by mouth 3 (three) times daily as needed for muscle spasms.    Dispense:  30 tablet    Refill:  0   oxyCODONE-acetaminophen (PERCOCET) 10-325 MG tablet    Sig: Take 1 tablet by mouth every 4 (four) hours as needed for pain.    Dispense:  30 tablet    Refill:  0

## 2022-05-30 ENCOUNTER — Encounter: Payer: Self-pay | Admitting: Cardiovascular Disease

## 2022-05-30 ENCOUNTER — Encounter (HOSPITAL_COMMUNITY): Payer: Self-pay | Admitting: Cardiovascular Disease

## 2022-05-30 ENCOUNTER — Telehealth: Payer: Self-pay | Admitting: Cardiovascular Disease

## 2022-05-30 NOTE — Telephone Encounter (Signed)
Pt's wife is calling back to discuss upcoming procedure. Requesting to know instructions and discuss coming in at a sooner time.

## 2022-05-30 NOTE — Telephone Encounter (Signed)
This my chart was handled in a separate phone call encounter.

## 2022-05-30 NOTE — Progress Notes (Signed)
Patient post implant in recovery with LOC on his LV lead. CXRs appeared to have stable lead position. Dr. Nelly Laurence discussed at length with the patient/his wife bedside to discharge and return for lead revision. Message was sent to Dr. Morrie Sheldon nurse to follow up with him for pre-procedure instructions/timing.  I will follow up with the office today  Francis Dowse, PA-C

## 2022-05-30 NOTE — Telephone Encounter (Signed)
Spoke with patient and his wife to clarify time of procedure on Friday. Addition questions regarding skin prep, advised I would have someone follow up tomorrow , 12/27, with detailed instructions on skin prep for procedure. Patient and wife verbalized understanding.

## 2022-05-31 ENCOUNTER — Telehealth: Payer: Self-pay | Admitting: Cardiovascular Disease

## 2022-05-31 ENCOUNTER — Encounter: Payer: Self-pay | Admitting: Cardiovascular Disease

## 2022-05-31 NOTE — Telephone Encounter (Signed)
LM for pt to call back regarding his Procedure.

## 2022-05-31 NOTE — Telephone Encounter (Signed)
Spoke with pt's Wife. She is upset about waiting until after the first because they have met there deductible. I have reached out to Dr. Mealor for suggestions.  

## 2022-05-31 NOTE — Telephone Encounter (Signed)
Pt wife calling back very upset and worried that pt may not be able to have his surgery. She wants to know if someone else can do his surgery prior to Friday. Please advise

## 2022-05-31 NOTE — Telephone Encounter (Signed)
Error

## 2022-05-31 NOTE — Telephone Encounter (Signed)
Called pt to give her an update that our management team is working on trying to get him scheduled and that I would be in touch as soon as I have a definite answer.   She was very appreciative for the call.

## 2022-05-31 NOTE — Telephone Encounter (Signed)
Pt's wife returning call regarding procedure. Please advise

## 2022-06-01 ENCOUNTER — Other Ambulatory Visit: Payer: Self-pay

## 2022-06-01 ENCOUNTER — Observation Stay (HOSPITAL_COMMUNITY)
Admission: RE | Admit: 2022-06-01 | Discharge: 2022-06-02 | Disposition: A | Discharge status: Home / Self Care | Payer: BC Managed Care – PPO | Source: Ambulatory Visit | Attending: Cardiology | Admitting: Cardiology

## 2022-06-01 ENCOUNTER — Encounter (HOSPITAL_COMMUNITY)
Admission: RE | Disposition: A | Discharge status: Home / Self Care | Payer: Self-pay | Source: Ambulatory Visit | Attending: Cardiology

## 2022-06-01 DIAGNOSIS — Z79899 Other long term (current) drug therapy: Secondary | ICD-10-CM | POA: Diagnosis not present

## 2022-06-01 DIAGNOSIS — I5021 Acute systolic (congestive) heart failure: Secondary | ICD-10-CM | POA: Diagnosis present

## 2022-06-01 DIAGNOSIS — I429 Cardiomyopathy, unspecified: Secondary | ICD-10-CM | POA: Insufficient documentation

## 2022-06-01 DIAGNOSIS — I5020 Unspecified systolic (congestive) heart failure: Secondary | ICD-10-CM | POA: Diagnosis not present

## 2022-06-01 DIAGNOSIS — I428 Other cardiomyopathies: Secondary | ICD-10-CM

## 2022-06-01 DIAGNOSIS — T82110A Breakdown (mechanical) of cardiac electrode, initial encounter: Secondary | ICD-10-CM

## 2022-06-01 DIAGNOSIS — Z96643 Presence of artificial hip joint, bilateral: Secondary | ICD-10-CM | POA: Diagnosis not present

## 2022-06-01 DIAGNOSIS — T85122A Displacement of implanted electronic neurostimulator (electrode) of spinal cord, initial encounter: Secondary | ICD-10-CM | POA: Diagnosis not present

## 2022-06-01 DIAGNOSIS — Z7982 Long term (current) use of aspirin: Secondary | ICD-10-CM | POA: Diagnosis not present

## 2022-06-01 DIAGNOSIS — I110 Hypertensive heart disease with heart failure: Secondary | ICD-10-CM | POA: Insufficient documentation

## 2022-06-01 DIAGNOSIS — Y828 Other medical devices associated with adverse incidents: Secondary | ICD-10-CM | POA: Insufficient documentation

## 2022-06-01 DIAGNOSIS — I447 Left bundle-branch block, unspecified: Secondary | ICD-10-CM

## 2022-06-01 HISTORY — PX: LEAD REVISION/REPAIR: EP1213

## 2022-06-01 LAB — CBC
HCT: 45.5 % (ref 39.0–52.0)
Hemoglobin: 15.3 g/dL (ref 13.0–17.0)
MCH: 29.4 pg (ref 26.0–34.0)
MCHC: 33.6 g/dL (ref 30.0–36.0)
MCV: 87.5 fL (ref 80.0–100.0)
Platelets: 184 10*3/uL (ref 150–400)
RBC: 5.2 MIL/uL (ref 4.22–5.81)
RDW: 14 % (ref 11.5–15.5)
WBC: 8.3 10*3/uL (ref 4.0–10.5)
nRBC: 0 % (ref 0.0–0.2)

## 2022-06-01 LAB — BASIC METABOLIC PANEL
Anion gap: 7 (ref 5–15)
BUN: 10 mg/dL (ref 8–23)
CO2: 24 mmol/L (ref 22–32)
Calcium: 9.1 mg/dL (ref 8.9–10.3)
Chloride: 105 mmol/L (ref 98–111)
Creatinine, Ser: 0.91 mg/dL (ref 0.61–1.24)
GFR, Estimated: >60 mL/min (ref >60)
Glucose, Bld: 89 mg/dL (ref 70–99)
Potassium: 4.6 mmol/L (ref 3.5–5.1)
Sodium: 136 mmol/L (ref 135–145)

## 2022-06-01 SURGERY — LEAD REVISION/REPAIR

## 2022-06-01 SURGERY — Surgical Case
Anesthesia: *Unknown

## 2022-06-01 MED ORDER — SACUBITRIL-VALSARTAN 97-103 MG PO TABS
1.0000 | ORAL_TABLET | Freq: Two times a day (BID) | ORAL | Status: DC
  Administered 2022-06-01 – 2022-06-02 (×2): 1 via ORAL
  Filled 2022-06-01 (×2): qty 1

## 2022-06-01 MED ORDER — SODIUM CHLORIDE 0.9 % IV SOLN
80.0000 mg | INTRAVENOUS | Status: AC
  Administered 2022-06-01: 80 mg

## 2022-06-01 MED ORDER — ROSUVASTATIN CALCIUM 20 MG PO TABS
20.0000 mg | ORAL_TABLET | Freq: Every day | ORAL | Status: DC
  Administered 2022-06-02: 20 mg via ORAL
  Filled 2022-06-01: qty 1

## 2022-06-01 MED ORDER — SODIUM CHLORIDE 0.9 % IV SOLN: INTRAVENOUS | Status: DC

## 2022-06-01 MED ORDER — LIDOCAINE HCL (PF) 1 % IJ SOLN
INTRAMUSCULAR | Status: AC
  Filled 2022-06-01: qty 60

## 2022-06-01 MED ORDER — CEFAZOLIN SODIUM-DEXTROSE 2-4 GM/100ML-% IV SOLN
2.0000 g | INTRAVENOUS | Status: AC
  Administered 2022-06-01: 2 g via INTRAVENOUS

## 2022-06-01 MED ORDER — SODIUM CHLORIDE 0.9 % IV SOLN
INTRAVENOUS | Status: AC
  Filled 2022-06-01: qty 2

## 2022-06-01 MED ORDER — SPIRONOLACTONE 25 MG PO TABS
25.0000 mg | ORAL_TABLET | Freq: Every day | ORAL | Status: DC
  Administered 2022-06-02: 25 mg via ORAL
  Filled 2022-06-01: qty 1

## 2022-06-01 MED ORDER — MIDAZOLAM HCL 5 MG/5ML IJ SOLN
INTRAMUSCULAR | Status: AC
  Filled 2022-06-01: qty 5

## 2022-06-01 MED ORDER — MIDAZOLAM HCL 5 MG/5ML IJ SOLN
INTRAMUSCULAR | Status: DC | PRN
  Administered 2022-06-01 (×4): 1 mg via INTRAVENOUS

## 2022-06-01 MED ORDER — ACETAMINOPHEN 325 MG PO TABS
ORAL_TABLET | ORAL | Status: AC
  Filled 2022-06-01: qty 2

## 2022-06-01 MED ORDER — LIDOCAINE HCL (PF) 1 % IJ SOLN
INTRAMUSCULAR | Status: AC
  Filled 2022-06-01: qty 30

## 2022-06-01 MED ORDER — LIDOCAINE HCL (PF) 1 % IJ SOLN
INTRAMUSCULAR | Status: DC | PRN
  Administered 2022-06-01: 60 mL

## 2022-06-01 MED ORDER — ONDANSETRON HCL 4 MG/2ML IJ SOLN: 4.0000 mg | Freq: Four times a day (QID) | INTRAMUSCULAR | Status: DC | PRN

## 2022-06-01 MED ORDER — HEPARIN (PORCINE) IN NACL 1000-0.9 UT/500ML-% IV SOLN
INTRAVENOUS | Status: DC | PRN
  Administered 2022-06-01: 500 mL

## 2022-06-01 MED ORDER — ACETAMINOPHEN 325 MG PO TABS
325.0000 mg | ORAL_TABLET | ORAL | Status: DC | PRN
  Administered 2022-06-01 (×2): 650 mg via ORAL
  Filled 2022-06-01: qty 2

## 2022-06-01 MED ORDER — CEFAZOLIN SODIUM-DEXTROSE 1-4 GM/50ML-% IV SOLN
1.0000 g | Freq: Four times a day (QID) | INTRAVENOUS | Status: AC
  Administered 2022-06-01 – 2022-06-02 (×3): 1 g via INTRAVENOUS
  Filled 2022-06-01 (×3): qty 50

## 2022-06-01 MED ORDER — IOHEXOL 350 MG/ML SOLN
INTRAVENOUS | Status: DC | PRN
  Administered 2022-06-01: 10 mL

## 2022-06-01 MED ORDER — FENTANYL CITRATE (PF) 100 MCG/2ML IJ SOLN
INTRAMUSCULAR | Status: DC | PRN
  Administered 2022-06-01 (×4): 25 ug via INTRAVENOUS

## 2022-06-01 MED ORDER — POVIDONE-IODINE 10 % EX SWAB: 2.0000 | Freq: Once | CUTANEOUS | Status: DC

## 2022-06-01 MED ORDER — FENTANYL CITRATE (PF) 100 MCG/2ML IJ SOLN
INTRAMUSCULAR | Status: AC
  Filled 2022-06-01: qty 2

## 2022-06-01 MED ORDER — HEPARIN (PORCINE) IN NACL 1000-0.9 UT/500ML-% IV SOLN
INTRAVENOUS | Status: AC
  Filled 2022-06-01: qty 500

## 2022-06-01 MED ORDER — CARVEDILOL 25 MG PO TABS
25.0000 mg | ORAL_TABLET | Freq: Two times a day (BID) | ORAL | Status: DC
  Administered 2022-06-01 – 2022-06-02 (×2): 25 mg via ORAL
  Filled 2022-06-01 (×2): qty 1

## 2022-06-01 MED ORDER — TRAMADOL HCL 50 MG PO TABS
50.0000 mg | ORAL_TABLET | Freq: Four times a day (QID) | ORAL | Status: DC | PRN
  Administered 2022-06-01 – 2022-06-02 (×3): 50 mg via ORAL
  Filled 2022-06-01 (×3): qty 1

## 2022-06-01 MED ORDER — CHLORHEXIDINE GLUCONATE 4 % EX LIQD: 4.0000 | Freq: Once | CUTANEOUS | Status: DC

## 2022-06-01 MED ORDER — CEFAZOLIN SODIUM-DEXTROSE 2-4 GM/100ML-% IV SOLN
INTRAVENOUS | Status: AC
  Filled 2022-06-01: qty 100

## 2022-06-01 SURGICAL SUPPLY — 13 items
BALLN COR SINUS VENO 6FR 80 (BALLOONS) ×1
BALLOON COR SINUS VENO 6FR 80 (BALLOONS) IMPLANT
CABLE SURGICAL S-101-97-12 (CABLE) ×2 IMPLANT
CATH CPS DIRECT 135 DS2C020 (CATHETERS) IMPLANT
LEAD QUARTET 1458Q-86CM (Lead) IMPLANT
PAD DEFIB RADIO PHYSIO CONN (PAD) ×2 IMPLANT
POUCH AIGIS-R ANTIBACT ICD (Mesh General) ×1 IMPLANT
POUCH AIGIS-R ANTIBACT ICD LRG (Mesh General) IMPLANT
SHEATH PROBE COVER 6X72 (BAG) IMPLANT
SLITTER AGILIS HISPRO (INSTRUMENTS) IMPLANT
TRAY PACEMAKER INSERTION (PACKS) ×2 IMPLANT
WIRE ACUITY WHISPER EDS 4648 (WIRE) IMPLANT
WIRE MAILMAN 182CM (WIRE) IMPLANT

## 2022-06-01 NOTE — H&P (Signed)
Cardiology Admission History and Physical   Patient ID: CAROLS KINTIGH MRN: [id number]; DOB: 11-04-1955   Admission date: 06/01/2022  PCP:  Jilda Panda, [facility] Prov[facility] Cardiologist:  None     Chief Complaint:  HFrEF, LBBB  Patient Profile:   [name] is a 66 y.o. male with HFrEF, HTN, OSA who is being seen 06/01/2022 for the evaluation of HFrEF.  History of Present Illness:   The patient has HFrEF and was prescribed entresto, coreg, spironolactone. He is followed by Dr Einar Gip. He had a CRT-D implanted 05/26/2022 for primary prevention. Unfortunately, the CS lead has dislodged and he presents today for lead revision.  Past Medical History:  Diagnosis Date   ARTHRITIS    Arthritis    CHONDROMALACIA PATELLA, LEFT    EXOGENOUS OBESITY    GOUT    HIP REPLACEMENT, BILATERAL, HX OF    Hypertension    Obstructive apnea    patient could not tolerate CPAP due to nasal airflow restriction.    SOB (shortness of breath)     Past Surgical History:  Procedure Laterality Date   BIV ICD INSERTION CRT-D N/A 05/26/2022   Procedure: BIV ICD INSERTION CRT-D;  Surgeon: Melida Quitter, MD;  Location: [facility];  Service: Cardiovascular;  Laterality: N/A;   JOINT REPLACEMENT     NASAL SINUS SURGERY     Dr . Ernesto Rutherford , October 2014 , nasal septum    RIGHT/LEFT HEART CATH AND CORONARY ANGIOGRAPHY N/A 05/05/2022   Procedure: RIGHT/LEFT HEART CATH AND CORONARY ANGIOGRAPHY;  Surgeon: Adrian Prows, MD;  Location: [facility];  Service: Cardiovascular;  Laterality: N/A;   TOTAL HIP REVISION Right 03/03/2015   Procedure: RIGHT HIP BEARING SURFACE REVISION;  Surgeon: Gaynelle Arabian, MD;  Location: [facility];  Service: Orthopedics;  Laterality: Right;     Medications Prior to Admission: Prior to Admission medications   Medication Sig Start Date End Date Taking? Authorizing Provider  allopurinol (ZYLOPRIM) 100 MG tablet Take 100 mg by mouth daily.   Yes  [provider]  aspirin EC 81 MG tablet Take 81 mg by mouth daily. Swallow whole.   Yes [provider]  b complex vitamins capsule Take 1 capsule by mouth daily.   Yes [provider]  carvedilol (COREG) 25 MG tablet TAKE 1 TABLET BY MOUTH TWICE A DAY 04/08/20  Yes Cantwell, Celeste C, PA-C  cyclobenzaprine (FLEXERIL) 10 MG tablet Take 1 tablet (10 mg total) by mouth 3 (three) times daily as needed for muscle spasms. 05/27/22  Yes Adrian Prows, MD  ENTRESTO 97-103 MG Take 1 tablet by mouth 2 (two) times daily. 04/11/22  Yes Ernst Spell, NP  Multiple Vitamins-Minerals (MULTIVITAMIN WITH MINERALS) tablet Take 1 tablet by mouth daily.   Yes [provider]  Multiple Vitamins-Minerals (ZINC PO) Take 1 tablet by mouth daily.   Yes [provider]  rosuvastatin (CRESTOR) 20 MG tablet Take 1 tablet (20 mg total) by mouth daily. 09/25/13  Yes [name] Hector, MD  Semaglutide-Weight Management 2.4 MG/0.75ML SOAJ Inject 2.4 mg into the skin once a week. 10/14/21  Yes [provider]  spironolactone (ALDACTONE) 25 MG tablet TAKE 1 TABLET (25 MG TOTAL) BY MOUTH DAILY. 12/12/21  Yes Cantwell, Celeste C, PA-C  tadalafil (CIALIS) 5 MG tablet Take 5 mg by mouth at bedtime. 02/24/20  Yes [provider]  tamsulosin (FLOMAX) 0.4 MG CAPS capsule Take 0.4 mg by mouth at  bedtime.   Yes [provider]  testosterone cypionate (DEPOTESTOSTERONE CYPIONATE) 200 MG/ML injection Inject 200 mg into the muscle once a week. 04/19/21  Yes [provider]  oxyCODONE-acetaminophen (PERCOCET) 10-325 MG tablet Take 1 tablet by mouth every 4 (four) hours as needed for pain. 05/27/22   Yates Decamp, MD     Allergies:   No Known Allergies  Social History:   Social History   Socioeconomic History   Marital status: Married    Spouse name: Not on file   Number of children: 0   Years of education: 16   Highest education level: Not on file   Occupational History   Not on file  Tobacco Use   Smoking status: Never   Smokeless tobacco: Never  Vaping Use   Vaping Use: Never used  Substance and Sexual Activity   Alcohol use: No   Drug use: No   Sexual activity: Not on file  Other Topics Concern   Not on file  Social History Narrative   Patient is married and lives alone.   Patient is working full-time.   Patient has a college education.   Patient is right-handed.   Patient drinks 5 cups of tea and sodas daily.   Social Determinants of Health   Financial Resource Strain: Not on file  Food Insecurity: Not on file  Transportation Needs: Not on file  Physical Activity: Not on file  Stress: Not on file  Social Connections: Not on file  Intimate Partner Violence: Not on file    Family History:   The patient's family history includes Heart disease in his brother; Hyperlipidemia in his brother; Hypertension in his brother. There is no history of Ataxia, Chorea, Dementia, Mental retardation, Migraines, Multiple sclerosis, Neurofibromatosis, Neuropathy, Parkinsonism, Seizures, or Stroke.    ROS:  Please see the history of present illness.  All other ROS reviewed and negative.     Physical Exam/Data:   Vitals:   06/01/22 1131  BP: (!) 142/91  Pulse: 79  Resp: 18  Temp: 97.8 F (36.6 C)  TempSrc: Temporal  SpO2: 95%  Weight: 99.8 kg  Height: 5\' 6"  (1.676 m)   No intake or output data in the 24 hours ending 06/01/22 1255    06/01/2022   11:31 AM 05/26/2022    8:43 AM 05/05/2022    7:39 AM  Last 3 Weights  Weight (lbs) 220 lb 220 lb 220 lb  Weight (kg) 99.791 kg 99.791 kg 99.791 kg     Body mass index is 35.51 kg/m.  General:  Well nourished, well developed, in no acute distress HEENT: normal Neck: no JVD Vascular: No carotid bruits; Distal pulses 2+ bilaterally   Cardiac:  normal S1, S2; RRR; no murmur. L prepectoral pocket healing  well. Lungs:  clear to auscultation bilaterally, no wheezing, rhonchi  or rales  Abd: soft, nontender, no hepatomegaly  Ext: no edema Musculoskeletal:  No deformities, BUE and BLE strength normal and equal Skin: warm and dry  Neuro:  CNs 2-12 intact, no focal abnormalities noted Psych:  Normal affect     Relevant CV Studies: CRT implant procedure reviewed from last month  Laboratory Data:  High Sensitivity Troponin:  No results for input(s): "TROPONINIHS" in the last 720 hours.    Chemistry Recent Labs  Lab 06/01/22 1144  NA 136  K 4.6  CL 105  CO2 24  GLUCOSE 89  BUN 10  CREATININE 0.91  CALCIUM 9.1  GFRNONAA >60  ANIONGAP 7  No results for input(s): "PROT", "ALBUMIN", "AST", "ALT", "ALKPHOS", "BILITOT" in the last 168 hours. Lipids No results for input(s): "CHOL", "TRIG", "HDL", "LABVLDL", "LDLCALC", "CHOLHDL" in the last 168 hours. Hematology Recent Labs  Lab 06/01/22 1144  WBC 8.3  RBC 5.20  HGB 15.3  HCT 45.5  MCV 87.5  MCH 29.4  MCHC 33.6  RDW 14.0  PLT 184   Thyroid No results for input(s): "TSH", "FREET4" in the last 168 hours. BNPNo results for input(s): "BNP", "PROBNP" in the last 168 hours.  DDimer No results for input(s): "DDIMER" in the last 168 hours.   Radiology/Studies:  No results found.   Assessment and Plan:   Mr Combest is a 66yo man with HFrEF who presents for CRT-D lead revision.   #HFrEF NYHA III. LBBB. Hx of syncope. On GDMT S/p CRTD implant [id number] complicated by CS lead dislodgement. Presents for lead revision.  The patient has HFrEF, EF 30%, NYHA Class III CHF, and CAD.  Risks, benefits, alternatives to CRTD system revision were discussed in detail with the patient today. The patient understands that the risks include but are not limited to bleeding, infection, pneumothorax, perforation, tamponade, vascular damage, renal failure, MI, stroke, death, inappropriate shocks, and lead dislodgement and wishes to proceed.      For questions or updates, please contact [facility] Please  consult [email or web address] for contact info under     Signed, Vickie Epley, MD  06/01/2022 12:55 PM

## 2022-06-01 NOTE — Progress Notes (Signed)
Pt arrived to unit from cath lab at 1815. Pt A&O x4, IV x1, on room air. Pt ambulated independently to bathroom. Wife at bedside. Weight and vitals obtained. Pt placed on continuous cardiac monitor. Provider Lalla Brothers notified of pt's arrival to unit. Call bell given to pt. Pt educated on not using left arm.

## 2022-06-02 ENCOUNTER — Encounter (HOSPITAL_COMMUNITY): Payer: Self-pay | Admitting: Cardiology

## 2022-06-02 ENCOUNTER — Observation Stay (HOSPITAL_COMMUNITY): Payer: BC Managed Care – PPO

## 2022-06-02 DIAGNOSIS — I429 Cardiomyopathy, unspecified: Secondary | ICD-10-CM | POA: Diagnosis not present

## 2022-06-02 DIAGNOSIS — I5020 Unspecified systolic (congestive) heart failure: Secondary | ICD-10-CM | POA: Diagnosis not present

## 2022-06-02 DIAGNOSIS — I5021 Acute systolic (congestive) heart failure: Secondary | ICD-10-CM | POA: Diagnosis not present

## 2022-06-02 DIAGNOSIS — T85122A Displacement of implanted electronic neurostimulator (electrode) of spinal cord, initial encounter: Secondary | ICD-10-CM | POA: Diagnosis not present

## 2022-06-02 DIAGNOSIS — Z96643 Presence of artificial hip joint, bilateral: Secondary | ICD-10-CM | POA: Diagnosis not present

## 2022-06-02 LAB — CBC
HCT: 44.2 % (ref 39.0–52.0)
Hemoglobin: 14.9 g/dL (ref 13.0–17.0)
MCH: 29.4 pg (ref 26.0–34.0)
MCHC: 33.7 g/dL (ref 30.0–36.0)
MCV: 87.2 fL (ref 80.0–100.0)
Platelets: 156 10*3/uL (ref 150–400)
RBC: 5.07 MIL/uL (ref 4.22–5.81)
RDW: 14.4 % (ref 11.5–15.5)
WBC: 9.1 10*3/uL (ref 4.0–10.5)
nRBC: 0 % (ref 0.0–0.2)

## 2022-06-02 LAB — BASIC METABOLIC PANEL
Anion gap: 12 (ref 5–15)
BUN: 12 mg/dL (ref 8–23)
CO2: 25 mmol/L (ref 22–32)
Calcium: 9.1 mg/dL (ref 8.9–10.3)
Chloride: 103 mmol/L (ref 98–111)
Creatinine, Ser: 0.83 mg/dL (ref 0.61–1.24)
GFR, Estimated: >60 mL/min (ref >60)
Glucose, Bld: 98 mg/dL (ref 70–99)
Potassium: 3.8 mmol/L (ref 3.5–5.1)
Sodium: 140 mmol/L (ref 135–145)

## 2022-06-02 NOTE — Progress Notes (Signed)
Heart Failure Navigator Progress Note  Assessed for Heart & Vascular TOC clinic readiness.  Patient does not meet criteria due to [facility] patient.   Navigator will sign off at this time.    Yuktha Kerchner, BSN, RN Heart Failure Nurse Navigator Secure Chat Only   

## 2022-06-02 NOTE — Discharge Summary (Signed)
ELECTROPHYSIOLOGY PROCEDURE DISCHARGE SUMMARY    Patient ID: [name],  MRN: [id number], DOB/AGE: [birth year] 66 y.o.  Admit date: 06/01/2022 Discharge date: 06/02/2022  Primary Care Physician: Ralene Ok, MD  Primary Cardiologist: Dr. Jacinto Halim Electrophysiologist: Dr. Nelly Laurence  Primary Discharge Diagnosis:  ICD lead dislodgement S/p lead revision this admission   Secondary Discharge Diagnosis:  NICM LBBB HTN HLD OSA   No Known Allergies   Procedures This Admission:  1.  CRT-D lead revision 06/01/22 by Dr. Lalla Brothers  CXR on 06/02/22 demonstrated no pneumothorax status post device implantation.   Brief HPI: [name] is a 66 y.o. male underwent CRT-D implant 05/26/22 with Dr. Nelly Laurence, post procedure device check note loss of capture on the LV lead with no clear lead movement by CXR. He was stable, planned to go home over the holiday and come back out patient for lead revision  Hospital Course:  The patient was admitted and underwent LV lead revision with details as outlined in his procedure report.  He was monitored on telemetry overnight which demonstrated SR/VP.  Left chest was without hematoma or ecchymosis.  The device was interrogated and found to be functioning normally.  CXR was obtained and demonstrated no pneumothorax status post device implantation.  Wound care, arm mobility, and restrictions were reviewed with the patient.  The patient feels well,  denies any CP or SOB, minimal site discomfort with some itching.  No rash noted, He was examined by Dr. Lalla Brothers and considered stable for discharge to home.   The patient's discharge medications include an ACE/ARB (Entresto) and beta blocker (carvedilol).   Discussed no ASA for a few days though he reports that Dr. Jacinto Halim had previously instructed him to stop it all together and has not been taking it  Pressure/bulky dressing replaced, I will see I'm in the office Tuesday to remove  Physical  Exam: Vitals:   06/01/22 1934 06/02/22 0038 06/02/22 0443 06/02/22 0733  BP: (!) 148/96 (!) 154/82 (!) 148/89 (!) 158/91  Pulse: 73 72 80 76  Resp: 20 20 20 20   Temp: 98 F (36.7 C) 98 F (36.7 C) 98.3 F (36.8 C) 98.8 F (37.1 C)  TempSrc: Oral Oral Oral Oral  SpO2: 94%  94% 93%  Weight:   99.3 kg   Height:        GEN- The patient is well appearing, alert and oriented x 3 today.   HEENT: normocephalic, atraumatic; sclera clear, conjunctiva pink; hearing intact; oropharynx clear Lungs- CTA b/l, normal work of breathing.  No wheezes, rales, rhonchi Heart- RRR, no murmurs, rubs or gallops, PMI not laterally displaced GI- soft, non-tender, non-distended Extremities- no clubbing, cyanosis, or edema MS- no significant deformity or atrophy Skin- warm and dry, no rash or lesion, left chest without hematoma/ecchymosis Psych- euthymic mood, full affect Neuro- no gross defecits  Labs:   Lab Results  Component Value Date   WBC 9.1 06/02/2022   HGB 14.9 06/02/2022   HCT 44.2 06/02/2022   MCV 87.2 06/02/2022   PLT 156 06/02/2022    Recent Labs  Lab 06/02/22 0109  NA 140  K 3.8  CL 103  CO2 25  BUN 12  CREATININE 0.83  CALCIUM 9.1  GLUCOSE 98    Discharge Medications:  Allergies as of 06/02/2022   No Known Allergies      Medication List     STOP taking these medications    aspirin EC 81 MG tablet  TAKE these medications    allopurinol 100 MG tablet Commonly known as: ZYLOPRIM Take 100 mg by mouth daily.   b complex vitamins capsule Take 1 capsule by mouth daily.   carvedilol 25 MG tablet Commonly known as: COREG TAKE 1 TABLET BY MOUTH TWICE A DAY   cyclobenzaprine 10 MG tablet Commonly known as: FLEXERIL Take 1 tablet (10 mg total) by mouth 3 (three) times daily as needed for muscle spasms.   Entresto 97-103 MG Generic drug: sacubitril-valsartan Take 1 tablet by mouth 2 (two) times daily.   multivitamin with minerals tablet Take 1 tablet  by mouth daily.   oxyCODONE-acetaminophen 10-325 MG tablet Commonly known as: Percocet Take 1 tablet by mouth every 4 (four) hours as needed for pain.   rosuvastatin 20 MG tablet Commonly known as: CRESTOR Take 1 tablet (20 mg total) by mouth daily.   Semaglutide-Weight Management 2.4 MG/0.75ML Soaj Inject 2.4 mg into the skin once a week.   spironolactone 25 MG tablet Commonly known as: ALDACTONE TAKE 1 TABLET (25 MG TOTAL) BY MOUTH DAILY.   tadalafil 5 MG tablet Commonly known as: CIALIS Take 5 mg by mouth at bedtime.   tamsulosin 0.4 MG Caps capsule Commonly known as: FLOMAX Take 0.4 mg by mouth at bedtime.   testosterone cypionate 200 MG/ML injection Commonly known as: DEPOTESTOSTERONE CYPIONATE Inject 200 mg into the muscle once a week.   ZINC PO Take 1 tablet by mouth daily.        Disposition: Home Discharge Instructions     Diet - low sodium heart healthy   Complete by: As directed    Increase activity slowly   Complete by: As directed        Follow-up Information     Jilda Panda, MD Follow up.   Specialty: Internal Medicine Why: Please follow up in a week. Contact information: [address] [address] [phone]         Baldwin Jamaica, PA-C Follow up.   Specialty: Cardiology Why: 06/06/22 @ 9:40AM to remove pressure dressing Contact information: [address] [place] [address] [phone]                 Duration of Discharge Encounter: Greater than 30 minutes including physician time.  Venetia Night, PA-C 06/02/2022 11:00 AM

## 2022-06-02 NOTE — Discharge Instructions (Addendum)
     Supplemental Discharge Instructions for  Pacemaker/Defibrillator Patients   Activity No heavy lifting or vigorous activity with your left/right arm for 6 to 8 weeks.  Do not raise your left/right arm above your head for one week.  Gradually raise your affected arm as drawn below.            06/06/22                       06/07/22                       06/08/22                    06/09/22 __  NO DRIVING for  1 week  ; you may begin driving on  08/09/60   .  WOUND CARE Keep the wound area clean and dry.  Do not get this area wet , no showers until cleared to at your wound check visit . LEAVE THE LARGE BULKY DRESSING IN PLACE UNTIL REMOVED IN OUR OFFICE The tape/steri-strips on your wound will fall off; do not pull them off.  No bandage is needed on the site.  DO  NOT apply any creams, oils, or ointments to the wound area. If you notice any drainage or discharge from the wound, any swelling or bruising at the site, or you develop a fever > 101? F after you are discharged home, call the office at once.  Special Instructions You are still able to use cellular telephones; use the ear opposite the side where you have your pacemaker/defibrillator.  Avoid carrying your cellular phone near your device. When traveling through airports, show security personnel your identification card to avoid being screened in the metal detectors.  Ask the security personnel to use the hand wand. Avoid arc welding equipment, MRI testing (magnetic resonance imaging), TENS units (transcutaneous nerve stimulators).  Call the office for questions about other devices. Avoid electrical appliances that are in poor condition or are not properly grounded. Microwave ovens are safe to be near or to operate.  Additional information for defibrillator patients should your device go off: If your device goes off ONCE and you feel fine afterward, notify the device clinic nurses. If your device goes off ONCE and you do not feel well  afterward, call 911. If your device goes off TWICE, call 911. If your device goes off THREE times in one day, call 911.  DO NOT DRIVE YOURSELF OR A FAMILY MEMBER WITH A DEFIBRILLATOR TO THE HOSPITAL--CALL 911.

## 2022-06-04 NOTE — Progress Notes (Unsigned)
Cardiology Office Note Date:  06/04/2022  Patient ID:  [name], [name] December 11, 1955, MRN [id number] PCP:  Jilda Panda, MD  Cardiologist:  Dr. Einar Gip Electrophysiologist: Dr. Myles Gip  ***refresh   Chief Complaint: *** pressure bandage removal  History of Present Illness: [name] is a 66 y.o. male with history of NICM, chronic CHF (systolic), HTN, HLD, OSA, NSVT  The patient underwent CRT-D implant 05/26/22 with Dr. Myles Gip, post  procedure device check note loss of capture on the LV lead with no clear lead movement by CXR. He was stable, planned to go home over the holiday and come back out patient for lead revision.  He had LV lead revision done with Dr. Quentin Ore 06/01/22, intra-procedurally pocket has evidence of a small hematoma.  Post procedure pressure dressing applied. He was discharged 06/02/22 with a new pressure dressing applied. He reported no longer taking ASA at the advice of Dr. Einar Gip.  *** site *** wound check appt, restrictions *** symptoms, volume *** Dr. Einar Gip   Device information Abbott CRT-D implanted 05/26/22, LV lead revision 06/01/22.   Past Medical History:  Diagnosis Date   ARTHRITIS    Arthritis    CHONDROMALACIA PATELLA, LEFT    EXOGENOUS OBESITY    GOUT    HIP REPLACEMENT, BILATERAL, HX OF    Hypertension    Obstructive apnea    patient could not tolerate CPAP due to nasal airflow restriction.    SOB (shortness of breath)     Past Surgical History:  Procedure Laterality Date   BIV ICD INSERTION CRT-D N/A 05/26/2022   Procedure: BIV ICD INSERTION CRT-D;  Surgeon: Melida Quitter, MD;  Location: [facility];  Service: Cardiovascular;  Laterality: N/A;   JOINT REPLACEMENT     LEAD REVISION/REPAIR N/A 06/01/2022   Procedure: LEAD REVISION/REPAIR;  Surgeon: Vickie Epley, MD;  Location: [facility];  Service: Cardiovascular;  Laterality: N/A;   NASAL SINUS SURGERY     Dr . Ernesto Rutherford , October 2014 , nasal septum     RIGHT/LEFT HEART CATH AND CORONARY ANGIOGRAPHY N/A 05/05/2022   Procedure: RIGHT/LEFT HEART CATH AND CORONARY ANGIOGRAPHY;  Surgeon: Adrian Prows, MD;  Location: [facility];  Service: Cardiovascular;  Laterality: N/A;   TOTAL HIP REVISION Right 03/03/2015   Procedure: RIGHT HIP BEARING SURFACE REVISION;  Surgeon: Gaynelle Arabian, MD;  Location: [facility];  Service: Orthopedics;  Laterality: Right;    Current Outpatient Medications  Medication Sig Dispense Refill   allopurinol (ZYLOPRIM) 100 MG tablet Take 100 mg by mouth daily.     b complex vitamins capsule Take 1 capsule by mouth daily.     carvedilol (COREG) 25 MG tablet TAKE 1 TABLET BY MOUTH TWICE A DAY 180 tablet 2   cyclobenzaprine (FLEXERIL) 10 MG tablet Take 1 tablet (10 mg total) by mouth 3 (three) times daily as needed for muscle spasms. 30 tablet 0   ENTRESTO 97-103 MG Take 1 tablet by mouth 2 (two) times daily. 180 tablet 3   Multiple Vitamins-Minerals (MULTIVITAMIN WITH MINERALS) tablet Take 1 tablet by mouth daily.     Multiple Vitamins-Minerals (ZINC PO) Take 1 tablet by mouth daily.     oxyCODONE-acetaminophen (PERCOCET) 10-325 MG tablet Take 1 tablet by mouth every 4 (four) hours as needed for pain. 30 tablet 0   rosuvastatin (CRESTOR) 20 MG tablet Take 1 tablet (20 mg total) by mouth daily. 90 tablet 3   Semaglutide-Weight Management 2.4 MG/0.75ML  SOAJ Inject 2.4 mg into the skin once a week.     spironolactone (ALDACTONE) 25 MG tablet TAKE 1 TABLET (25 MG TOTAL) BY MOUTH DAILY. 90 tablet 1   tadalafil (CIALIS) 5 MG tablet Take 5 mg by mouth at bedtime.     tamsulosin (FLOMAX) 0.4 MG CAPS capsule Take 0.4 mg by mouth at bedtime.     testosterone cypionate (DEPOTESTOSTERONE CYPIONATE) 200 MG/ML injection Inject 200 mg into the muscle once a week.     No current facility-administered medications for this visit.    Allergies:   Patient has no known allergies.   Social History:  The patient  reports that he has never  smoked. He has never used smokeless tobacco. He reports that he does not drink alcohol and does not use drugs.   Family History:  The patient's family history includes Heart disease in his brother; Hyperlipidemia in his brother; Hypertension in his brother.  ROS:  Please see the history of present illness.    All other systems are reviewed and otherwise negative.   PHYSICAL EXAM:  VS:  There were no vitals taken for this visit. BMI: There is no height or weight on file to calculate BMI. Well nourished, well developed, in no acute distress HEENT: normocephalic, atraumatic Neck: no JVD, carotid bruits or masses Cardiac:  *** RRR; no significant murmurs, no rubs, or gallops Lungs:  *** CTA b/l, no wheezing, rhonchi or rales Abd: soft, nontender MS: no deformity or *** atrophy Ext: *** no edema Skin: warm and dry, no rash Neuro:  No gross deficits appreciated Psych: euthymic mood, full affect  *** ICD site is stable, ***   EKG:  not done today  Device interrogation done today and reviewed by myself:  ***   Right &Left Heart Catheterization 05/05/22:  LV: 91/46, EDP 6 mmHg.  Ao 93/76, mean 72 mmHg.  No pressure gradient across aortic valve. LV is mildly dilated.  Global hypokinesis.  EF 30 to 35%.  No significant MR. LM: Short, bifurcates.  Normal. LAD: Very large caliber vessel, gives origin to small D1 and 2 large D2 and D3 from the mid segment.  Smooth and normal. LCx: Dominant.  Gives origin to a very large OM1 and OM 2 and gives origin to 2 large posterolateral branches. RCA: Small nondominant, normal.   Right heart catheterization: RA 2/3, mean 1 mmHg.  RV 21/7, EDP 7 mmHg.  PA 22/16, mean 13 mmHg.  PA saturation 69%.  PW 3/3, mean 1 mmHg. Q peak/QS 1.00.  CO 4.68 and CI 2.22 by Fick.   Impression: Findings consistent with nonischemic dilated cardiomyopathy.   Echocardiogram 02/046/2020: Left ventricular cavity is normal in size.  Moderate concentric hypertrophy of the  LV.  Abnormal septal wall motion due to left bundle branch block.  Doppler evidence of grade 1 (impaired) diastolic dysfunction, normal LAP.  Calculated EF 26%.  Visual LVEF 25 to 30% Left atrial cavity is mildly dilated Moderate grade 3 aortic regurgitation Mild grade 1 mitral regurgitation Mild tricuspid regurgitation No evidence of pulmonary hypertension. Compared to previous study 04/18/2017, there is an interval increase in aortic regurgitation severity and minimal decrease in LV EF.   GXT 04/16/2017: Resting EKG demonstrates normal sinus rhythm, left bundle branch block.  Exercise capacity was normal.  Patient walked for 8: 30 minutes on Bruce protocol, achieved 10.1 METS and reached 85% THR. No arrhythmias no chest pain. Heart rate response to exercise: Appropriate.  BP response to exercise: Normal resting  BP, exaggerated hypertensive response. ST changes: Evaluation for ischemia elevated in the setting of left bundle branch block. Stress test terminated due to fatigue. Normal exercise capacity for age.  Nondiagnostic treadmill stress test due to underlying left bundle branch block.  Clinical correlation rec   Recent Labs: 06/02/2022: BUN 12; Creatinine, Ser 0.83; Hemoglobin 14.9; Platelets 156; Potassium 3.8; Sodium 140  No results found for requested labs within last 365 days.   Estimated Creatinine Clearance: 99.2 mL/min (by C-G formula based on SCr of 0.83 mg/dL).   Wt Readings from Last 3 Encounters:  06/02/22 218 lb 14.4 oz (99.3 kg)  05/26/22 220 lb (99.8 kg)  05/05/22 220 lb (99.8 kg)     Other studies reviewed: Additional studies/records reviewed today include: summarized above  ASSESSMENT AND PLAN:  CRT-D *** Wound check as usual   NICM Chronic CHF *** C/w Dr. Delaine Lame  NSVT ***   Disposition: F/u with ***  Current medicines are reviewed at length with the patient today.  The patient did not have any concerns regarding medicines.  Venetia Night, PA-C 06/04/2022 8:56 AM     [address] [place] [address] [phone] (office)  [phone] (fax)

## 2022-06-06 ENCOUNTER — Ambulatory Visit: Payer: BC Managed Care – PPO | Attending: Physician Assistant | Admitting: Physician Assistant

## 2022-06-06 ENCOUNTER — Encounter: Payer: Self-pay | Admitting: Physician Assistant

## 2022-06-06 VITALS — BP 110/70 | HR 86 | Ht 66.0 in | Wt 220.0 lb

## 2022-06-06 DIAGNOSIS — I428 Other cardiomyopathies: Secondary | ICD-10-CM | POA: Diagnosis not present

## 2022-06-06 DIAGNOSIS — I4729 Other ventricular tachycardia: Secondary | ICD-10-CM

## 2022-06-06 DIAGNOSIS — Z95810 Presence of automatic (implantable) cardiac defibrillator: Secondary | ICD-10-CM | POA: Diagnosis not present

## 2022-06-06 DIAGNOSIS — I5022 Chronic systolic (congestive) heart failure: Secondary | ICD-10-CM

## 2022-06-06 LAB — CUP PACEART INCLINIC DEVICE CHECK
Battery Remaining Longevity: 57 mo
Brady Statistic RA Percent Paced: 0.2 %
Brady Statistic RV Percent Paced: 99.85 %
Date Time Interrogation Session: 20240102105101
HighPow Impedance: 55.125
Implantable Lead Connection Status: 753985
Implantable Lead Connection Status: 753985
Implantable Lead Connection Status: 753985
Implantable Lead Implant Date: 20231222
Implantable Lead Implant Date: 20231222
Implantable Lead Implant Date: 20231222
Implantable Lead Location: 753858
Implantable Lead Location: 753859
Implantable Lead Location: 753860
Implantable Pulse Generator Implant Date: 20231222
Lead Channel Impedance Value: 412.5 Ohm
Lead Channel Impedance Value: 437.5 Ohm
Lead Channel Impedance Value: 625 Ohm
Lead Channel Pacing Threshold Amplitude: 0.75 V
Lead Channel Pacing Threshold Amplitude: 0.75 V
Lead Channel Pacing Threshold Amplitude: 0.75 V
Lead Channel Pacing Threshold Amplitude: 0.75 V
Lead Channel Pacing Threshold Amplitude: 0.75 V
Lead Channel Pacing Threshold Amplitude: 0.75 V
Lead Channel Pacing Threshold Pulse Width: 0.5 ms
Lead Channel Pacing Threshold Pulse Width: 0.5 ms
Lead Channel Pacing Threshold Pulse Width: 0.5 ms
Lead Channel Pacing Threshold Pulse Width: 0.5 ms
Lead Channel Pacing Threshold Pulse Width: 0.5 ms
Lead Channel Pacing Threshold Pulse Width: 0.5 ms
Lead Channel Sensing Intrinsic Amplitude: 12 mV
Lead Channel Sensing Intrinsic Amplitude: 2.2 mV
Lead Channel Setting Pacing Amplitude: 3.5 V
Lead Channel Setting Pacing Amplitude: 3.5 V
Lead Channel Setting Pacing Amplitude: 3.5 V
Lead Channel Setting Pacing Pulse Width: 0.5 ms
Lead Channel Setting Pacing Pulse Width: 0.5 ms
Lead Channel Setting Sensing Sensitivity: 0.5 mV
Pulse Gen Serial Number: 211011714
Zone Setting Status: 755011

## 2022-06-06 NOTE — Patient Instructions (Signed)
Medication Instructions:   Your physician recommends that you continue on your current medications as directed. Please refer to the Current Medication list given to you today.   *If you need a refill on your cardiac medications before your next appointment, please call your pharmacy*   Lab Work: [place]   If you have labs (blood work) drawn today and your tests are completely normal, you will receive your results only by: [place] (if you have MyChart) OR A paper copy in the mail If you have any lab test that is abnormal or we need to change your treatment, we will call you to review the results.   Testing/Procedures: NONE ORDERED  TODAY    Follow-Up: At [facility], you and your health needs are our priority.  As part of our continuing mission to provide you with exceptional heart care, we have created designated Provider Care Teams.  These Care Teams include your primary Cardiologist (physician) and Advanced Practice Providers (APPs -  Physician Assistants and Nurse Practitioners) who all work together to provide you with the care you need, when you need it.  We recommend signing up for the patient portal called "MyChart".  Sign up information is provided on this After Visit Summary.  MyChart is used to connect with patients for Virtual Visits (Telemedicine).  Patients are able to view lab/test results, encounter notes, upcoming appointments, etc.  Non-urgent messages can be sent to your provider as well.   To learn more about what you can do with MyChart, go to [email or web address].    Your next appointment:   AS SCHEDULED   3 month(s)  The format for your next appointment:   In Person  Provider:   Doralee Albino, MD    Other Instructions   Important Information About Sugar

## 2022-06-07 ENCOUNTER — Ambulatory Visit: Payer: BC Managed Care – PPO

## 2022-06-08 ENCOUNTER — Telehealth: Payer: Self-pay | Admitting: Physician Assistant

## 2022-06-08 NOTE — Telephone Encounter (Signed)
Pt would like a callback regarding Pacemaker. He states that he has been having a tightness as well as puffiness around the area. Pt would like a callback regarding this matter.   FYI - He also sent a [employer]

## 2022-06-09 ENCOUNTER — Ambulatory Visit: Payer: BC Managed Care – PPO | Attending: Interventional Cardiology

## 2022-06-09 DIAGNOSIS — I428 Other cardiomyopathies: Secondary | ICD-10-CM

## 2022-06-09 DIAGNOSIS — I447 Left bundle-branch block, unspecified: Secondary | ICD-10-CM

## 2022-06-09 NOTE — Telephone Encounter (Signed)
Spoke with patient, patient concerned about the $66 co-pay he may have to pay today, as he just paid $80 the other day, informed patient that this would be a no-charge visit, informed patient that I spoke with the front desk and informed them that there would not be a bill sent to insurance for this visit. Patient agreeable to apt today at 10:00AM for wound check

## 2022-06-09 NOTE — Progress Notes (Signed)
Incision site assessed steri-strips intact, no redness noted, very minimal amount of swelling noted, patient stated that swelling is better today, advised patient this is normal as he starts to move his arm move. Patient stated that PO benadryl was not helping with the itching, advised patient that itching is part of the healing process as well as where he was shaved for procedure, advised patient he could try some hydrocortisone cream but to allow a 2inch diameter around strips where he did not put the cream, patient voiced understanding

## 2022-06-09 NOTE — Telephone Encounter (Signed)
See my chart message

## 2022-06-14 ENCOUNTER — Ambulatory Visit: Payer: BC Managed Care – PPO | Attending: Internal Medicine

## 2022-06-14 DIAGNOSIS — I428 Other cardiomyopathies: Secondary | ICD-10-CM | POA: Diagnosis not present

## 2022-06-14 LAB — CUP PACEART INCLINIC DEVICE CHECK
Brady Statistic RA Percent Paced: 1 % — CL
Date Time Interrogation Session: 20240110170749
HighPow Impedance: 53 Ohm
Implantable Lead Connection Status: 753985
Implantable Lead Connection Status: 753985
Implantable Lead Connection Status: 753985
Implantable Lead Implant Date: 20231222
Implantable Lead Implant Date: 20231222
Implantable Lead Implant Date: 20231222
Implantable Lead Location: 753858
Implantable Lead Location: 753859
Implantable Lead Location: 753860
Implantable Pulse Generator Implant Date: 20231222
Lead Channel Impedance Value: 400 Ohm
Lead Channel Impedance Value: 460 Ohm
Lead Channel Impedance Value: 730 Ohm
Lead Channel Pacing Threshold Amplitude: 0.5 V
Lead Channel Pacing Threshold Amplitude: 0.75 V
Lead Channel Pacing Threshold Amplitude: 1 V
Lead Channel Pacing Threshold Pulse Width: 0.5 ms
Lead Channel Pacing Threshold Pulse Width: 0.5 ms
Lead Channel Pacing Threshold Pulse Width: 0.5 ms
Lead Channel Sensing Intrinsic Amplitude: 12 mV
Lead Channel Sensing Intrinsic Amplitude: 3.4 mV
Pulse Gen Serial Number: 211011714

## 2022-06-14 NOTE — Progress Notes (Signed)
Wound check appointment. Steri-strips removed. Wound without redness or edema. Incision edges approximated, wound well healed. Normal device function. Thresholds, sensing, and impedances consistent with implant measurements. Device programmed at 3.5V for extra safety margin until 3 month visit. Histogram distribution appropriate for patient and level of activity. No mode switches or ventricular arrhythmias noted. Patient educated about wound care, arm mobility, lifting restrictions, shock plan. ROV in 3 months with implanting physician. Patient did complain of increased itching at wound site. Wound healing WNL. Educated that itching is likely combination of healing, hair growth and bandage that has now been removed.  He can apply cool compresses to the area and take OTC benadryl but should not apply any type of ointment/cream to the wound area.  He can shower now which may also help with the area but encouraged to wash area gently with soap and water using clean cloth/towel.  Also for ongoing soreness/discomfort, encouraged him to take Tylenol unless instructed for other reasons not to take. He knows the discomfort post procedure can last for several weeks to months but should be improving as area is healing.  Patient verbalizes understanding of all instructions reviewed today.

## 2022-06-14 NOTE — Patient Instructions (Signed)
   After Your ICD (Implantable Cardiac Defibrillator)    Monitor your defibrillator site for redness, swelling, and drainage. Call the device clinic at [phone] if you experience these symptoms or fever/chills.  Your incision was closed with Steri-strips or staples:  You may shower 7 days after your procedure and wash your incision with soap and water. Avoid lotions, ointments, or perfumes over your incision until it is well-healed.  You may use a hot tub or a pool after your wound check appointment if the incision is completely closed.  Do not lift, push or pull greater than 10 pounds with the affected arm until 6 weeks after your procedure. There are no other restrictions in arm movement after your wound check appointment.  Until AFTER [facility].   Your ICD is designed to protect you from life threatening heart rhythms. Because of this, you may receive a shock.   1 shock with no symptoms:  Call the office during business hours. 1 shock with symptoms (chest pain, chest pressure, dizziness, lightheadedness, shortness of breath, overall feeling unwell):  Call 911. If you experience 2 or more shocks in 24 hours:  Call 911. If you receive a shock, you should not drive.  [place] DMV - no driving for 6 months if you receive appropriate therapy from your ICD.   ICD Alerts:  Some alerts are vibratory and others beep. These are NOT emergencies. Please call our office to let us know. If this occurs at night or on weekends, it can wait until the next business day. Send a remote transmission.  If your device is capable of reading fluid status (for heart failure), you will be offered monthly monitoring to review this with you.   Remote monitoring is used to monitor your ICD from home. This monitoring is scheduled every 91 days by our office. It allows [place] to keep an eye on the functioning of your device to ensure it is working properly. You will routinely see your Electrophysiologist annually (more  often if necessary).

## 2022-06-16 ENCOUNTER — Encounter: Payer: Self-pay | Admitting: Cardiovascular Disease

## 2022-06-16 ENCOUNTER — Telehealth: Payer: Self-pay | Admitting: Cardiovascular Disease

## 2022-06-16 NOTE — Telephone Encounter (Signed)
CPT codes for initial implant for ICD are 33225 & 33249.  Attempted to contact [name] at # left in phone note.  Received non-specific voicemail.  Did not leave message.  Will need to attempt again at a later date.

## 2022-06-16 NOTE — Telephone Encounter (Signed)
Patient states his insurane informed him that they need a specific Procedure Code form 12/22 ICD implant with Dr. Myles Gip. Patient provided a phone number for a [place]am[place] with [facility] [place], [phone]. He requested that the call be returned to [facility] with the Procedure Code

## 2022-06-20 ENCOUNTER — Telehealth: Payer: Self-pay | Admitting: Physician Assistant

## 2022-06-20 NOTE — Telephone Encounter (Signed)
Patient states he would like the call back to go to: [phone]

## 2022-06-20 NOTE — Telephone Encounter (Signed)
Pt would like a callback regarding device. He has questions as to what he is and isn't able to do. Please advise

## 2022-06-21 NOTE — Telephone Encounter (Signed)
Spoke with patient informed him that he would need to let security know he had ICD before going through the metal detectors at the airport, advised patient that by 07/14/22 when he leave for [place] he will be ok to put sun screen on.

## 2022-06-21 NOTE — Telephone Encounter (Addendum)
I left a vague voice mail for [name] to return a call to [facility] billing, no other info given.  Scott Patella ([employer] ins agent) returned call.  I provided him with the codes for ICD implant.

## 2022-06-28 NOTE — Telephone Encounter (Signed)
Patient called to check on his orthotics  to see if they were in yet, let patient know that have not come in yet?

## 2022-06-29 ENCOUNTER — Other Ambulatory Visit: Payer: Self-pay | Admitting: Cardiology

## 2022-06-29 DIAGNOSIS — E6609 Other obesity due to excess calories: Secondary | ICD-10-CM

## 2022-06-29 DIAGNOSIS — R739 Hyperglycemia, unspecified: Secondary | ICD-10-CM

## 2022-06-29 DIAGNOSIS — I5022 Chronic systolic (congestive) heart failure: Secondary | ICD-10-CM

## 2022-06-29 MED ORDER — SEMAGLUTIDE-WEIGHT MANAGEMENT 2.4 MG/0.75ML ~~LOC~~ SOAJ: 2.4000 mg | SUBCUTANEOUS | 3 refills | Status: DC

## 2022-06-29 NOTE — Progress Notes (Signed)
ICD-10-CM   1. Chronic systolic congestive heart failure (HCC)  I50.22 Semaglutide-Weight Management 2.4 MG/0.75ML SOAJ    2. Hyperglycemia  R73.9 Semaglutide-Weight Management 2.4 MG/0.75ML SOAJ    3. Class 1 obesity due to excess calories with serious comorbidity and body mass index (BMI) of 34.0 to 34.9 in adult  E66.09 Semaglutide-Weight Management 2.4 MG/0.75ML SOAJ   Z68.34      Meds ordered this encounter  Medications   Semaglutide-Weight Management 2.4 MG/0.75ML SOAJ    Sig: Inject 2.4 mg into the skin once a week.    Dispense:  9 mL    Refill:  3    Medications Discontinued During This Encounter  Medication Reason   Semaglutide-Weight Management 2.4 MG/0.75ML SOAJ Reorder

## 2022-06-30 ENCOUNTER — Telehealth: Payer: Self-pay | Admitting: Podiatry

## 2022-06-30 NOTE — Telephone Encounter (Signed)
Good morning Mr [name] left a message on the voicemail , said that the arch in his orthotics are too hard and they are causing him pain, is there a way to have softer arches put in his new orthotics that he ordered?  Please advise.

## 2022-07-05 ENCOUNTER — Ambulatory Visit (INDEPENDENT_AMBULATORY_CARE_PROVIDER_SITE_OTHER): Payer: BC Managed Care – PPO

## 2022-07-05 DIAGNOSIS — M722 Plantar fascial fibromatosis: Secondary | ICD-10-CM

## 2022-07-05 DIAGNOSIS — M2141 Flat foot [pes planus] (acquired), right foot: Secondary | ICD-10-CM

## 2022-07-05 DIAGNOSIS — M2142 Flat foot [pes planus] (acquired), left foot: Secondary | ICD-10-CM

## 2022-07-05 NOTE — Progress Notes (Signed)
Patient presents to the office today with issues concerning their custom inserts. Patient states that they are too hard.  Will send orthotics to lab for adjustments. Will change the top cover to 3MM black spenco and adding a layer of 1.5 ppt, OK per Dr Jacqualyn Posey.  Advised patient that he will receive a call from the office to come in for a fitting.

## 2022-07-12 ENCOUNTER — Encounter: Payer: Self-pay | Admitting: Cardiovascular Disease

## 2022-07-24 ENCOUNTER — Telehealth: Payer: Self-pay | Admitting: Cardiology

## 2022-07-24 DIAGNOSIS — I428 Other cardiomyopathies: Secondary | ICD-10-CM

## 2022-07-24 DIAGNOSIS — I5022 Chronic systolic (congestive) heart failure: Secondary | ICD-10-CM

## 2022-07-24 DIAGNOSIS — I5020 Unspecified systolic (congestive) heart failure: Secondary | ICD-10-CM

## 2022-07-24 NOTE — Telephone Encounter (Signed)
ICD-10-CM   1. Class 2 severe obesity due to excess calories with serious comorbidity and body mass index (BMI) of 35.0 to 35.9 in adult [facility])  E66.01 Ambulatory referral to [facility]   Z68.35     2. Chronic systolic congestive heart failure ([place])  I50.22 Ambulatory referral to [facility]    3. NICM (nonischemic cardiomyopathy) ([place])  I42.8 Ambulatory referral to Family Practice    4. HFrEF (heart failure with reduced ejection fraction) ([place])  I50.20 Ambulatory referral to Family Practice     Orders Placed This Encounter  Procedures   Ambulatory referral to Family Practice    Referral Priority:   Routine    Referral Type:   Consultation    Referral Reason:   Specialty Services Required    Referred to Provider:   Georgianne Fick, FNP    Requested Specialty:   Family Medicine    Number of Visits Requested:   1

## 2022-07-26 ENCOUNTER — Ambulatory Visit (INDEPENDENT_AMBULATORY_CARE_PROVIDER_SITE_OTHER): Payer: BC Managed Care – PPO

## 2022-07-26 DIAGNOSIS — M2142 Flat foot [pes planus] (acquired), left foot: Secondary | ICD-10-CM

## 2022-07-26 DIAGNOSIS — M2141 Flat foot [pes planus] (acquired), right foot: Secondary | ICD-10-CM | POA: Diagnosis not present

## 2022-07-26 NOTE — Progress Notes (Signed)
Patient presents today to pick up custom molded foot orthotics recommended by Dr. Jacqualyn Posey.   Orthotics were dispensed and fit was satisfactory. Reviewed instructions for break-in and wear. Written instructions given to patient.  Patient will follow up as needed.   [facility] - order # [id number]

## 2022-08-07 ENCOUNTER — Telehealth: Payer: Self-pay | Admitting: Podiatry

## 2022-08-07 NOTE — Telephone Encounter (Signed)
Lmom to call back to schedule picking up orthotics

## 2022-08-21 ENCOUNTER — Ambulatory Visit (INDEPENDENT_AMBULATORY_CARE_PROVIDER_SITE_OTHER): Payer: BC Managed Care – PPO | Admitting: Podiatry

## 2022-08-21 ENCOUNTER — Encounter (INDEPENDENT_AMBULATORY_CARE_PROVIDER_SITE_OTHER): Payer: Self-pay | Admitting: Family Medicine

## 2022-08-21 ENCOUNTER — Ambulatory Visit (INDEPENDENT_AMBULATORY_CARE_PROVIDER_SITE_OTHER): Payer: BC Managed Care – PPO | Admitting: Family Medicine

## 2022-08-21 VITALS — BP 128/68 | HR 60 | Temp 97.5°F | Ht 65.0 in | Wt 233.0 lb

## 2022-08-21 DIAGNOSIS — M722 Plantar fascial fibromatosis: Secondary | ICD-10-CM

## 2022-08-21 DIAGNOSIS — I2510 Atherosclerotic heart disease of native coronary artery without angina pectoris: Secondary | ICD-10-CM | POA: Diagnosis not present

## 2022-08-21 DIAGNOSIS — Z0289 Encounter for other administrative examinations: Secondary | ICD-10-CM

## 2022-08-21 DIAGNOSIS — M2141 Flat foot [pes planus] (acquired), right foot: Secondary | ICD-10-CM

## 2022-08-21 DIAGNOSIS — Z6838 Body mass index (BMI) 38.0-38.9, adult: Secondary | ICD-10-CM | POA: Diagnosis not present

## 2022-08-21 DIAGNOSIS — M2142 Flat foot [pes planus] (acquired), left foot: Secondary | ICD-10-CM

## 2022-08-24 ENCOUNTER — Ambulatory Visit (INDEPENDENT_AMBULATORY_CARE_PROVIDER_SITE_OTHER): Payer: BC Managed Care – PPO

## 2022-08-24 DIAGNOSIS — I428 Other cardiomyopathies: Secondary | ICD-10-CM | POA: Diagnosis not present

## 2022-08-25 LAB — CUP PACEART REMOTE DEVICE CHECK
Battery Remaining Longevity: 58 mo
Battery Remaining Percentage: 94 %
Battery Voltage: 3.01 V
Brady Statistic AP VP Percent: 7.3 %
Brady Statistic AP VS Percent: 1 %
Brady Statistic AS VP Percent: 92 %
Brady Statistic AS VS Percent: 1 %
Brady Statistic RA Percent Paced: 7.1 %
Date Time Interrogation Session: 20240321220004
HighPow Impedance: 61 Ohm
Implantable Lead Connection Status: 753985
Implantable Lead Connection Status: 753985
Implantable Lead Connection Status: 753985
Implantable Lead Implant Date: 20231222
Implantable Lead Implant Date: 20231222
Implantable Lead Implant Date: 20231222
Implantable Lead Location: 753858
Implantable Lead Location: 753859
Implantable Lead Location: 753860
Implantable Pulse Generator Implant Date: 20231222
Lead Channel Impedance Value: 410 Ohm
Lead Channel Impedance Value: 510 Ohm
Lead Channel Impedance Value: 680 Ohm
Lead Channel Pacing Threshold Amplitude: 0.5 V
Lead Channel Pacing Threshold Amplitude: 0.75 V
Lead Channel Pacing Threshold Amplitude: 1 V
Lead Channel Pacing Threshold Pulse Width: 0.5 ms
Lead Channel Pacing Threshold Pulse Width: 0.5 ms
Lead Channel Pacing Threshold Pulse Width: 0.5 ms
Lead Channel Sensing Intrinsic Amplitude: 12 mV
Lead Channel Sensing Intrinsic Amplitude: 2.5 mV
Lead Channel Setting Pacing Amplitude: 3.5 V
Lead Channel Setting Pacing Amplitude: 3.5 V
Lead Channel Setting Pacing Amplitude: 3.5 V
Lead Channel Setting Pacing Pulse Width: 0.5 ms
Lead Channel Setting Pacing Pulse Width: 0.5 ms
Lead Channel Setting Sensing Sensitivity: 0.5 mV
Pulse Gen Serial Number: 211011714
Zone Setting Status: 755011

## 2022-08-28 NOTE — Progress Notes (Unsigned)
Office: [phone]  /  Fax: [phone]   Initial Visit  [name] was seen in clinic today to evaluate for obesity. He is interested in losing weight to improve overall health and reduce the risk of weight related complications. He presents today to review program treatment options, initial physical assessment, and evaluation.     He was referred by: Specialist  When asked what else they would like to accomplish? He states: Improve existing medical conditions and Improve quality of life  Some associated conditions: Heart disease  Current or previous pharmacotherapy: GLP-1  Response to medication: Lost weight initially but was unable to sustain weight loss (when insurance no longer covered)   Past medical history includes:   Past Medical History:  Diagnosis Date   ARTHRITIS    Arthritis    CHONDROMALACIA PATELLA, LEFT    EXOGENOUS OBESITY    GOUT    HIP REPLACEMENT, BILATERAL, HX OF    Hypertension    Obstructive apnea    patient could not tolerate CPAP due to nasal airflow restriction.    SOB (shortness of breath)      Objective:   BP 128/68   Pulse 60   Temp (!) 97.5 F (36.4 C)   Ht 5\' 5"  (1.651 m)   Wt 233 lb (105.7 kg)   SpO2 96%   BMI 38.77 kg/m  He was weighed on the bioimpedance scale: Body mass index is 38.77 kg/m. Visceral Fat Rating:22, Body Fat%:33.0.  General:  Alert, oriented and cooperative. Patient is in no acute distress.  Respiratory: Normal respiratory effort, no problems with respiration noted  Extremities: Normal range of motion.    Mental Status: Normal mood and affect. Normal behavior. Normal judgment and thought content.   Assessment and Plan:  1. Coronary artery disease, unspecified vessel or lesion type, unspecified whether angina present, unspecified whether native or transplanted heart [name] has a history of CAD and cardiomyopathy. He is working on his weight loss to help improve his cardiac function.   We will schedule  for additional testing and [name] will start his eating plan based on these results.   2. Class 2 severe obesity with serious comorbidity and body mass index (BMI) of 38.0 to 38.9 in adult, unspecified obesity type ([name]) We reviewed weight, biometrics, associated medical conditions and contributing factors with patient. He would benefit from weight loss therapy via a modified calorie, low-carb, high-protein nutritional plan tailored to their REE (resting energy expenditure) which will be determined by indirect calorimetry.  We will also assess for cardiometabolic risk and nutritional derangements via fasting serologies at his next appointment.      Obesity Treatment / Action Plan:  Will complete provided nutritional and psychosocial assessment questionnaire before the next appointment. Will be scheduled for indirect calorimetry to determine resting energy expenditure in a fasting state.  This will allow [place] to create a reduced calorie, high-protein meal plan to promote loss of fat mass while preserving muscle mass. Was counseled on nutritional approaches to weight loss and benefits of complex carbs and high quality protein as part of nutritional weight management.  Obesity Education Performed Today:  He was weighed on the bioimpedance scale and results were discussed and documented in the synopsis.  We discussed obesity as a disease and the importance of a more detailed evaluation of all the factors contributing to the disease.  We discussed the importance of long term lifestyle changes which include nutrition, exercise and behavioral modifications as well as the importance of  customizing this to his specific health and social needs.  We discussed the benefits of reaching a healthier weight to alleviate the symptoms of existing conditions and reduce the risks of the biomechanical, metabolic and psychological effects of obesity.  [name] appears to be in the action stage of change and  states they are ready to start intensive lifestyle modifications and behavioral modifications.  45 minutes was spent today on this visit including the above counseling, pre-visit chart review, and post-visit documentation.  Reviewed by clinician on day of visit: allergies, medications, problem list, medical history, surgical history, family history, social history, and previous encounter notes.   I, Trixie Dredge, am acting as transcriptionist for Dennard Nip, MD   I have reviewed the above documentation for accuracy and completeness, and I agree with the above. ***

## 2022-08-31 ENCOUNTER — Encounter: Payer: Self-pay | Admitting: Cardiovascular Disease

## 2022-08-31 ENCOUNTER — Ambulatory Visit: Payer: BC Managed Care – PPO | Attending: Cardiovascular Disease | Admitting: Cardiovascular Disease

## 2022-08-31 VITALS — BP 140/76 | HR 78 | Ht 66.0 in | Wt 240.0 lb

## 2022-08-31 DIAGNOSIS — I428 Other cardiomyopathies: Secondary | ICD-10-CM | POA: Diagnosis not present

## 2022-08-31 NOTE — Progress Notes (Signed)
Electrophysiology Office Note:    Date:  08/31/2022   ID:  [name], DOB 04/19/1956, MRN [id number]  PCP:  Jilda Panda, [place] Providers Cardiologist:  None Electrophysiologist:  Melida Quitter, MD     Referring MD: Jilda Panda, MD   History of Present Illness:    [name] is a 67 y.o. male with a hx listed below, significant for CHFrEF, referred by dr. Einar Gip for management of syncope, LBBB, and risk of sudden cardiac death.  The patient has a history of CHFrEF. TTE 07/2018 showed an EF of 25-30% which was unchanged on TTE in January of this year. Throughout this time, he was prescribed entresto, carvedilol, and spironolactone. Unfortunately, he was lost to follow-up.  The patient was very active in the past, played sports. He has had fatigued and some functional limitation since his diagnosis with CHF. He still enjoys weight lifting.  He re-presented to Dr. Einar Gip after suffering frank syncope while driving. Onset was fairly abrupt, preceded by a few moments of nausea.  He wore a monitor for 12 days. It showed a few brief runs of NSVT. He did not have any syncope or pre-syncope while wearing the monitor.  He underwent placement of a BiV ICD on December 22.  Unfortunately the LV lead dislodged, and he went revision of the LV lead on December 29 by Dr. Quentin Ore. He reports that he is doing well. He notes some mild dizziness just after reclining. No edema, increased shortness of breath, chest pain, syncope.  Past Medical History:  Diagnosis Date   ARTHRITIS    Arthritis    CHONDROMALACIA PATELLA, LEFT    EXOGENOUS OBESITY    GOUT    HIP REPLACEMENT, BILATERAL, HX OF    Hypertension    Obstructive apnea    patient could not tolerate CPAP due to nasal airflow restriction.    SOB (shortness of breath)     Past Surgical History:  Procedure Laterality Date   BIV ICD INSERTION CRT-D N/A 05/26/2022   Procedure: BIV ICD INSERTION CRT-D;  Surgeon:  Melida Quitter, MD;  Location: [facility];  Service: Cardiovascular;  Laterality: N/A;   JOINT REPLACEMENT     LEAD REVISION/REPAIR N/A 06/01/2022   Procedure: LEAD REVISION/REPAIR;  Surgeon: Vickie Epley, MD;  Location: [facility];  Service: Cardiovascular;  Laterality: N/A;   NASAL SINUS SURGERY     Dr . Ernesto Rutherford , October 2014 , nasal septum    RIGHT/LEFT HEART CATH AND CORONARY ANGIOGRAPHY N/A 05/05/2022   Procedure: RIGHT/LEFT HEART CATH AND CORONARY ANGIOGRAPHY;  Surgeon: Adrian Prows, MD;  Location: [facility];  Service: Cardiovascular;  Laterality: N/A;   TOTAL HIP REVISION Right 03/03/2015   Procedure: RIGHT HIP BEARING SURFACE REVISION;  Surgeon: Gaynelle Arabian, MD;  Location: [facility];  Service: Orthopedics;  Laterality: Right;    Current Medications: Current Meds  Medication Sig   allopurinol (ZYLOPRIM) 100 MG tablet Take 100 mg by mouth daily.   b complex vitamins capsule Take 1 capsule by mouth daily.   carvedilol (COREG) 25 MG tablet TAKE 1 TABLET BY MOUTH TWICE A DAY   cyclobenzaprine (FLEXERIL) 10 MG tablet Take 1 tablet (10 mg total) by mouth 3 (three) times daily as needed for muscle spasms.   ENTRESTO 97-103 MG Take 1 tablet by mouth 2 (two) times daily.   Multiple Vitamins-Minerals (MULTIVITAMIN WITH MINERALS) tablet Take 1 tablet by mouth daily.  Multiple Vitamins-Minerals (ZINC PO) Take 1 tablet by mouth daily.   oxyCODONE-acetaminophen (PERCOCET) 10-325 MG tablet Take 1 tablet by mouth every 4 (four) hours as needed for pain.   rosuvastatin (CRESTOR) 20 MG tablet Take 1 tablet (20 mg total) by mouth daily.   spironolactone (ALDACTONE) 25 MG tablet TAKE 1 TABLET (25 MG TOTAL) BY MOUTH DAILY.   tadalafil (CIALIS) 5 MG tablet Take 5 mg by mouth at bedtime.   tamsulosin (FLOMAX) 0.4 MG CAPS capsule Take 0.4 mg by mouth at bedtime.   testosterone cypionate (DEPOTESTOSTERONE CYPIONATE) 200 MG/ML injection Inject 200 mg into the muscle once a  week.     Allergies:   Patient has no known allergies.   Social History   Socioeconomic History   Marital status: Married    Spouse name: Not on file   Number of children: 0   Years of education: 16   Highest education level: Not on file  Occupational History   Not on file  Tobacco Use   Smoking status: Never   Smokeless tobacco: Never  Vaping Use   Vaping Use: Never used  Substance and Sexual Activity   Alcohol use: No   Drug use: No   Sexual activity: Not on file  Other Topics Concern   Not on file  Social History Narrative   Patient is married and lives alone.   Patient is working full-time.   Patient has a college education.   Patient is right-handed.   Patient drinks 5 cups of tea and sodas daily.   Social Determinants of Health   Financial Resource Strain: Not on file  Food Insecurity: No Food Insecurity (06/02/2022)   Hunger Vital Sign    Worried About Running Out of Food in the Last Year: Never true    Ran Out of Food in the Last Year: Never true  Transportation Needs: No Transportation Needs (06/02/2022)   PRAPARE - Hydrologist (Medical): No    Lack of Transportation (Non-Medical): No  Physical Activity: Not on file  Stress: Not on file  Social Connections: Not on file     Family History: The patient's family history includes Heart disease in his brother; Hyperlipidemia in his brother; Hypertension in his brother. There is no history of Ataxia, Chorea, Dementia, Mental retardation, Migraines, Multiple sclerosis, Neurofibromatosis, Neuropathy, Parkinsonism, Seizures, or Stroke.  ROS:   Please see the history of present illness.    All other systems reviewed and are negative.  EKGs/Labs/Other Studies Reviewed Today:     TTEs from 2020, 2023, 04/18/2022 monitor.  EKG:  Last EKG results: sinus rhythm with typical LBBB   Recent Labs: 06/02/2022: BUN 12; Creatinine, Ser 0.83; Hemoglobin 14.9; Platelets 156; Potassium  3.8; Sodium 140     Physical Exam:    VS:  BP (!) 140/76   Pulse 78   Ht 5\' 6"  (1.676 m)   Wt 240 lb (108.9 kg)   SpO2 95%   BMI 38.74 kg/m     Wt Readings from Last 3 Encounters:  08/31/22 240 lb (108.9 kg)  08/21/22 233 lb (105.7 kg)  06/06/22 220 lb (99.8 kg)     GEN: Well nourished, well developed in no acute distress CARDIAC: RRR, no murmurs, rubs, gallops RESPIRATORY:  Normal work of breathing MUSCULOSKELETAL: no edema    ASSESSMENT & PLAN:    CHFrEF: on GDMT with entresto, carvedilol, spironolactone. SGLT2i to be added by dr. Einar Gip in the future.       -  recommend repeat TTE to assess response to CRT Abbott CRT-D: interrogation today, reviewed by me, will be available in [facility]. Normal function. LBBB: CRT-D in place with good QRS Syncope: occurred during intermittent fasting. Possibly vagal, but would assume VT given cardiomyopathy. ICD now in place       Medication Adjustments/Labs and Tests Ordered: Current medicines are reviewed at length with the patient today.  Concerns regarding medicines are outlined above.  No orders of the defined types were placed in this encounter.  No orders of the defined types were placed in this encounter.    Signed, Melida Quitter, MD  08/31/2022 2:21 PM    [place]

## 2022-08-31 NOTE — Patient Instructions (Addendum)
Medication Instructions:  Your physician recommends that you continue on your current medications as directed. Please refer to the Current Medication list given to you today.  *If you need a refill on your cardiac medications before your next appointment, please call your pharmacy*  Follow-Up: At [facility], you and your health needs are our priority.  As part of our continuing mission to provide you with exceptional heart care, we have created designated Provider Care Teams.  These Care Teams include your primary Cardiologist (physician) and Advanced Practice Providers (APPs -  Physician Assistants and Nurse Practitioners) who all work together to provide you with the care you need, when you need it.  Your next appointment:   1 year(s)  Provider:   You will see one of the following Advanced Practice Providers on your designated Care Team:   Renee Ursuy, PA-C Michael "Andy" Tillery, PA-C Suzann Riddle, NP  

## 2022-09-03 ENCOUNTER — Other Ambulatory Visit: Payer: Self-pay | Admitting: Cardiology

## 2022-09-03 DIAGNOSIS — I5022 Chronic systolic (congestive) heart failure: Secondary | ICD-10-CM

## 2022-09-03 DIAGNOSIS — Z95810 Presence of automatic (implantable) cardiac defibrillator: Secondary | ICD-10-CM

## 2022-09-04 ENCOUNTER — Encounter (INDEPENDENT_AMBULATORY_CARE_PROVIDER_SITE_OTHER): Payer: Self-pay

## 2022-09-04 ENCOUNTER — Ambulatory Visit (INDEPENDENT_AMBULATORY_CARE_PROVIDER_SITE_OTHER): Payer: BC Managed Care – PPO | Admitting: Family Medicine

## 2022-09-05 ENCOUNTER — Encounter: Payer: Self-pay | Admitting: Cardiology

## 2022-09-05 DIAGNOSIS — I5022 Chronic systolic (congestive) heart failure: Secondary | ICD-10-CM | POA: Insufficient documentation

## 2022-09-05 DIAGNOSIS — Z95810 Presence of automatic (implantable) cardiac defibrillator: Secondary | ICD-10-CM | POA: Insufficient documentation

## 2022-09-05 HISTORY — DX: Presence of automatic (implantable) cardiac defibrillator: Z95.810

## 2022-09-08 ENCOUNTER — Telehealth: Payer: Self-pay | Admitting: Podiatry

## 2022-09-08 NOTE — Telephone Encounter (Signed)
Patient called and stated they have questions about the billing of orthotics for the 2nd pair he received.  Please call patient

## 2022-09-11 ENCOUNTER — Encounter: Payer: Self-pay | Admitting: Cardiovascular Disease

## 2022-09-12 ENCOUNTER — Ambulatory Visit (INDEPENDENT_AMBULATORY_CARE_PROVIDER_SITE_OTHER): Payer: BC Managed Care – PPO | Admitting: Internal Medicine

## 2022-09-13 ENCOUNTER — Other Ambulatory Visit: Payer: Self-pay | Admitting: Cardiology

## 2022-09-13 ENCOUNTER — Ambulatory Visit (INDEPENDENT_AMBULATORY_CARE_PROVIDER_SITE_OTHER): Payer: BC Managed Care – PPO | Admitting: Internal Medicine

## 2022-09-13 ENCOUNTER — Encounter (INDEPENDENT_AMBULATORY_CARE_PROVIDER_SITE_OTHER): Payer: Self-pay | Admitting: Internal Medicine

## 2022-09-13 ENCOUNTER — Telehealth: Payer: Self-pay | Admitting: Cardiology

## 2022-09-13 VITALS — BP 138/86 | HR 76 | Temp 97.8°F | Ht 66.0 in | Wt 235.0 lb

## 2022-09-13 DIAGNOSIS — E7800 Pure hypercholesterolemia, unspecified: Secondary | ICD-10-CM

## 2022-09-13 DIAGNOSIS — Z6838 Body mass index (BMI) 38.0-38.9, adult: Secondary | ICD-10-CM

## 2022-09-13 DIAGNOSIS — Z1331 Encounter for screening for depression: Secondary | ICD-10-CM | POA: Diagnosis not present

## 2022-09-13 DIAGNOSIS — R5383 Other fatigue: Secondary | ICD-10-CM | POA: Diagnosis not present

## 2022-09-13 DIAGNOSIS — I428 Other cardiomyopathies: Secondary | ICD-10-CM

## 2022-09-13 DIAGNOSIS — I5022 Chronic systolic (congestive) heart failure: Secondary | ICD-10-CM

## 2022-09-13 DIAGNOSIS — I10 Essential (primary) hypertension: Secondary | ICD-10-CM

## 2022-09-13 DIAGNOSIS — R0602 Shortness of breath: Secondary | ICD-10-CM | POA: Diagnosis not present

## 2022-09-13 DIAGNOSIS — E559 Vitamin D deficiency, unspecified: Secondary | ICD-10-CM | POA: Diagnosis not present

## 2022-09-13 DIAGNOSIS — I2510 Atherosclerotic heart disease of native coronary artery without angina pectoris: Secondary | ICD-10-CM | POA: Insufficient documentation

## 2022-09-13 DIAGNOSIS — G4733 Obstructive sleep apnea (adult) (pediatric): Secondary | ICD-10-CM

## 2022-09-13 MED ORDER — ZEPBOUND 10 MG/0.5ML ~~LOC~~ SOAJ: 10.0000 mg | SUBCUTANEOUS | 1 refills | Status: DC

## 2022-09-13 NOTE — Assessment & Plan Note (Addendum)
Currently on carvedilol, Entresto and spironolactone.  He is also on CRT.  There are no signs or symptoms of volume overload.  There might be some dietary indiscretion with consumption of salt.  Losing 15% of body weight may improve cardiac status.  We discussed the importance of reading food labels and maintaining a low-sodium diet in addition to reducing saturated fats to less than 10% of calories.  We will check renal function and electrolytes today.  Blood pressure slightly above target we will repeat at the next office visit.

## 2022-09-13 NOTE — Assessment & Plan Note (Signed)
Severe currently untreated patient has intolerance to mask.  Counseled on the risk associated with untreated sleep apnea.  Losing 15% of body weight may improve condition.

## 2022-09-13 NOTE — Assessment & Plan Note (Signed)
Not at goal.  Currently on Entresto, carvedilol and spironolactone.  I recommend that he check his blood pressure twice daily for trends.  Losing 10% of body weight may improve condition.  Also reducing sodium in his diet

## 2022-09-13 NOTE — Assessment & Plan Note (Signed)
History of vitamin D deficiency.  Currently not on supplementation.  We will check vitamin D levels.

## 2022-09-13 NOTE — Progress Notes (Unsigned)
Chief Complaint:   OBESITY [name] (MR# [id number]) is a 67 y.o. male who presents for evaluation and treatment of obesity and related comorbidities. Current BMI is Body mass index is 37.93 kg/m. [name] has been struggling with his weight for many years and has been unsuccessful in either losing weight, maintaining weight loss, or reaching his healthy weight goal.  [name] is currently in the action stage of change and ready to dedicate time achieving and maintaining a healthier weight. [name] is interested in becoming our patient and working on intensive lifestyle modifications including (but not limited to) diet and exercise for weight loss.  [name]'s habits were reviewed today and are as follows: His family eats meals together, he thinks his family will eat healthier with him, he struggles with family and or coworkers weight loss sabotage, his desired weight loss is 65 lbs, he started gaining weight after hip surgery, his heaviest weight ever was 250 pounds, he has significant food cravings issues, he snacks frequently in the evenings, he is frequently drinking liquids with calories, he frequently makes poor food choices, he has problems with excessive hunger, he frequently eats larger portions than normal, and he struggles with emotional eating.  Depression Screen [name]'s Food and Mood (modified PHQ-9) score was 18.  Subjective:   1. Other fatigue [name] admits to daytime somnolence and admits to waking up still tired. Patient has a history of symptoms of daytime fatigue and morning fatigue. [name] generally gets 4 or 5 hours of sleep per night, and states that he has nightime awakenings. Snoring is present. Apneic episodes are present. Epworth Sleepiness Score is 15.   2. SOB (shortness of breath) on exertion Darin Salvoaymond notes increasing shortness of breath with exercising and seems to be worsening over time with weight gain. He notes getting out of breath sooner with  activity than he used to. This has not gotten worse recently. [name] denies shortness of breath at rest or orthopnea.  3. Pure hypercholesterolemia LDL is not at goal. Elevated LDL may be secondary to nutrition, genetics and spillover effect from excess adiposity. Recommended LDL goal is <70 to reduce the risk of fatty streaks and the progression to obstructive ASCVD in the future.   Lab Results  Component Value Date   CHOL 223 (H) 11/04/2008   HDL 49.20 11/04/2008   LDLDIRECT 169.1 11/04/2008   TRIG 66.0 11/04/2008   CHOLHDL 5 11/04/2008   4. Vitamin D deficiency History of vitamin D deficiency.  Currently not on supplementation.  5. NICM (nonischemic cardiomyopathy) Currently on carvedilol, Entresto and spironolactone.  He is also on CRT.  There are no signs or symptoms of volume overload.  There might be some dietary indiscretion with consumption of salt.  6. Primary hypertension Not at goal.  Currently on Entresto, carvedilol and spironolactone.  7. Obstructive sleep apnea Severe currently untreated patient has intolerance to mask.  Assessment/Plan:   1. Other fatigue [name] does feel that his weight is causing his energy to be lower than it should be. Fatigue may be related to obesity, depression or many other causes. Labs will be ordered, and in the meanwhile, [name] will focus on self care including making healthy food choices, increasing physical activity and focusing on stress reduction.  - Vitamin B12  2. SOB (shortness of breath) on exertion [name] does feel that he gets out of breath more easily that he used to when he exercises. [name]'s shortness of breath appears to be obesity related and  exercise induced. He has agreed to work on weight loss and gradually increase exercise to treat his exercise induced shortness of breath. Will continue to monitor closely.  3. Pure hypercholesterolemia I do not have results of his most recent LDL cholesterol.  We are checking  a fasting lipid profile today.  He is currently on high-dose statin therapy with rosuvastatin.  Losing 10% of body weight may improve lipid profile.  Continue statin therapy.  - TSH - Lipid Panel With LDL/HDL Ratio  4. Vitamin D deficiency We will check vitamin D levels.  - VITAMIN D 25 Hydroxy (Vit-D Deficiency, Fractures)  5. NICM (nonischemic cardiomyopathy) Losing 15% of body weight may improve cardiac status.  We discussed the importance of reading food labels and maintaining a low-sodium diet in addition to reducing saturated fats to less than 10% of calories.  We will check renal function and electrolytes today.  Blood pressure slightly above target we will repeat at the next office visit.  6. Primary hypertension I recommend that he check his blood pressure twice daily for trends.  Losing 10% of body weight may improve condition.  Also reducing sodium in his diet.  7. Obstructive sleep apnea Counseled on the risk associated with untreated sleep apnea.  Losing 15% of body weight may improve condition.  8. Depression screen [name] had a positive depression screening. Depression is commonly associated with obesity and often results in emotional eating behaviors. We will monitor this closely and work on CBT to help improve the non-hunger eating patterns. Referral to Psychology may be required if no improvement is seen as he continues in our clinic.  9. Class 2 severe obesity with serious comorbidity and body mass index (BMI) of 38.0 to 38.9 in adult, unspecified obesity type We will check labs today.  - CBC with Differential/Platelet - Comprehensive metabolic panel - Hemoglobin A1c - Insulin, random  [name] is currently in the action stage of change and his goal is to continue with weight loss efforts. I recommend [name] begin the structured treatment plan as follows:  He has agreed to the Category 3 Plan.  Exercise goals: No exercise has been prescribed at this time.    Behavioral modification strategies: increasing lean protein intake, decreasing simple carbohydrates, increasing vegetables, increasing water intake, decreasing liquid calories, decreasing sodium intake, no skipping meals, meal planning and cooking strategies, keeping healthy foods in the home, better snacking choices, avoiding temptations, and planning for success.  He was informed of the importance of frequent follow-up visits to maximize his success with intensive lifestyle modifications for his multiple health conditions. He was informed we would discuss his lab results at his next visit unless there is a critical issue that needs to be addressed sooner. [name] agreed to keep his next visit at the agreed upon time to discuss these results.  Objective:   Blood pressure 138/86, pulse 76, temperature 97.8 F (36.6 C), height 5\' 6"  (1.676 m), weight 235 lb (106.6 kg), SpO2 95 %. Body mass index is 37.93 kg/m.  EKG: Normal sinus rhythm, rate 78 BPM.  Indirect Calorimeter completed today shows a VO2 of 312 and a REE of 2160.  His calculated basal metabolic rate is 7342 thus his basal metabolic rate is worse than expected.  General: Cooperative, alert, well developed, in no acute distress. HEENT: Conjunctivae and lids unremarkable. Cardiovascular: Regular rhythm.  Lungs: Normal work of breathing. Neurologic: No focal deficits.   Lab Results  Component Value Date   CREATININE 0.83 06/02/2022  BUN 12 06/02/2022   NA 140 06/02/2022   K 3.8 06/02/2022   CL 103 06/02/2022   CO2 25 06/02/2022   Lab Results  Component Value Date   ALT 46 02/22/2015   AST 39 02/22/2015   ALKPHOS 61 02/22/2015   BILITOT 0.7 02/22/2015   No results found for: "HGBA1C" No results found for: "INSULIN" Lab Results  Component Value Date   TSH 1.32 11/04/2008   Lab Results  Component Value Date   CHOL 223 (H) 11/04/2008   HDL 49.20 11/04/2008   LDLDIRECT 169.1 11/04/2008   TRIG 66.0 11/04/2008    CHOLHDL 5 11/04/2008   Lab Results  Component Value Date   WBC 9.1 06/02/2022   HGB 14.9 06/02/2022   HCT 44.2 06/02/2022   MCV 87.2 06/02/2022   PLT 156 06/02/2022   No results found for: "IRON", "TIBC", "FERRITIN"  Attestation Statements:   Reviewed by clinician on day of visit: allergies, medications, problem list, medical history, surgical history, family history, social history, and previous encounter notes.  Time spent on visit including pre-visit chart review and post-visit charting and care was 40 minutes.   Trude Mcburney, am acting as transcriptionist for Worthy Rancher, MD.  I have reviewed the above documentation for accuracy and completeness, and I agree with the above. -Worthy Rancher, MD

## 2022-09-13 NOTE — Assessment & Plan Note (Signed)
LDL is not at goal. Elevated LDL may be secondary to nutrition, genetics and spillover effect from excess adiposity. Recommended LDL goal is <70 to reduce the risk of fatty streaks and the progression to obstructive ASCVD in the future.   Lab Results  Component Value Date   CHOL 223 (H) 11/04/2008   HDL 49.20 11/04/2008   LDLDIRECT 169.1 11/04/2008   TRIG 66.0 11/04/2008   CHOLHDL 5 11/04/2008   I do not have results of his most recent LDL cholesterol.  We are checking a fasting lipid profile today.  He is currently on high-dose statin therapy with rosuvastatin.  Losing 10% of body weight may improve lipid profile.  Continue statin therapy

## 2022-09-13 NOTE — Telephone Encounter (Signed)
ICD-10-CM   1. Chronic systolic congestive heart failure  I50.22 tirzepatide (ZEPBOUND) 10 MG/0.5ML Pen    2. Class 2 severe obesity due to excess calories with serious comorbidity and body mass index (BMI) of 35.0 to 35.9 in adult  E66.01 tirzepatide (ZEPBOUND) 10 MG/0.5ML Pen   Z68.35      No orders of the defined types were placed in this encounter.   Meds ordered this encounter  Medications   tirzepatide (ZEPBOUND) 10 MG/0.5ML Pen    Sig: Inject 10 mg into the skin once a week.    Dispense:  2 mL    Refill:  1

## 2022-09-14 LAB — COMPREHENSIVE METABOLIC PANEL
ALT: 51 IU/L — ABNORMAL HIGH (ref 0–44)
AST: 37 IU/L (ref 0–40)
Albumin/Globulin Ratio: 1.6 (ref 1.2–2.2)
Albumin: 4.4 g/dL (ref 3.9–4.9)
Alkaline Phosphatase: 66 IU/L (ref 44–121)
BUN/Creatinine Ratio: 21 (ref 10–24)
BUN: 17 mg/dL (ref 8–27)
Bilirubin Total: 0.5 mg/dL (ref 0.0–1.2)
CO2: 21 mmol/L (ref 20–29)
Calcium: 9.2 mg/dL (ref 8.6–10.2)
Chloride: 103 mmol/L (ref 96–106)
Creatinine, Ser: 0.82 mg/dL (ref 0.76–1.27)
Globulin, Total: 2.7 g/dL (ref 1.5–4.5)
Glucose: 100 mg/dL — ABNORMAL HIGH (ref 70–99)
Potassium: 4.4 mmol/L (ref 3.5–5.2)
Sodium: 140 mmol/L (ref 134–144)
Total Protein: 7.1 g/dL (ref 6.0–8.5)
eGFR: 98 mL/min/{1.73_m2} (ref >59)

## 2022-09-14 LAB — CBC WITH DIFFERENTIAL/PLATELET
Basophils Absolute: 0.1 10*3/uL (ref 0.0–0.2)
Basos: 1 %
EOS (ABSOLUTE): 0.3 10*3/uL (ref 0.0–0.4)
Eos: 3 %
Hematocrit: 51.4 % — ABNORMAL HIGH (ref 37.5–51.0)
Hemoglobin: 17.1 g/dL (ref 13.0–17.7)
Immature Grans (Abs): 0.1 10*3/uL (ref 0.0–0.1)
Immature Granulocytes: 2 %
Lymphocytes Absolute: 1.3 10*3/uL (ref 0.7–3.1)
Lymphs: 15 %
MCH: 29.2 pg (ref 26.6–33.0)
MCHC: 33.3 g/dL (ref 31.5–35.7)
MCV: 88 fL (ref 79–97)
Monocytes Absolute: 0.7 10*3/uL (ref 0.1–0.9)
Monocytes: 9 %
Neutrophils Absolute: 5.9 10*3/uL (ref 1.4–7.0)
Neutrophils: 70 %
Platelets: 196 10*3/uL (ref 150–450)
RBC: 5.85 x10E6/uL — ABNORMAL HIGH (ref 4.14–5.80)
RDW: 13.7 % (ref 11.6–15.4)
WBC: 8.3 10*3/uL (ref 3.4–10.8)

## 2022-09-14 LAB — LIPID PANEL WITH LDL/HDL RATIO
Cholesterol, Total: 158 mg/dL (ref 100–199)
HDL: 58 mg/dL (ref >39)
LDL Chol Calc (NIH): 88 mg/dL (ref 0–99)
LDL/HDL Ratio: 1.5 ratio (ref 0.0–3.6)
Triglycerides: 60 mg/dL (ref 0–149)
VLDL Cholesterol Cal: 12 mg/dL (ref 5–40)

## 2022-09-14 LAB — VITAMIN D 25 HYDROXY (VIT D DEFICIENCY, FRACTURES): Vit D, 25-Hydroxy: 30.2 ng/mL (ref 30.0–100.0)

## 2022-09-14 LAB — TSH: TSH: 1.78 u[IU]/mL (ref 0.450–4.500)

## 2022-09-14 LAB — HEMOGLOBIN A1C
Est. average glucose Bld gHb Est-mCnc: 108 mg/dL
Hgb A1c MFr Bld: 5.4 % (ref 4.8–5.6)

## 2022-09-14 LAB — VITAMIN B12: Vitamin B-12: 1379 pg/mL — ABNORMAL HIGH (ref 232–1245)

## 2022-09-14 LAB — INSULIN, RANDOM: INSULIN: 34.8 u[IU]/mL — ABNORMAL HIGH (ref 2.6–24.9)

## 2022-09-16 ENCOUNTER — Other Ambulatory Visit (HOSPITAL_BASED_OUTPATIENT_CLINIC_OR_DEPARTMENT_OTHER): Payer: Self-pay

## 2022-09-18 ENCOUNTER — Ambulatory Visit (INDEPENDENT_AMBULATORY_CARE_PROVIDER_SITE_OTHER): Payer: BC Managed Care – PPO | Admitting: Family Medicine

## 2022-09-19 ENCOUNTER — Other Ambulatory Visit: Payer: Self-pay | Admitting: Cardiology

## 2022-09-19 MED ORDER — SPIRONOLACTONE 25 MG PO TABS: 25.0000 mg | ORAL_TABLET | Freq: Every day | ORAL | 3 refills | Status: DC

## 2022-09-22 ENCOUNTER — Other Ambulatory Visit: Payer: Self-pay | Admitting: Cardiology

## 2022-09-22 DIAGNOSIS — I5022 Chronic systolic (congestive) heart failure: Secondary | ICD-10-CM

## 2022-09-22 MED ORDER — ZEPBOUND 15 MG/0.5ML ~~LOC~~ SOAJ: 15.0000 mg | SUBCUTANEOUS | 6 refills | Status: DC

## 2022-09-25 ENCOUNTER — Telehealth: Payer: Self-pay

## 2022-09-25 NOTE — Telephone Encounter (Signed)
Pharmacy called and said that 15 mg is not available of the tirzepatide , they have 10 mg and also mentioned B6 10 mg/ml

## 2022-09-26 ENCOUNTER — Other Ambulatory Visit: Payer: Self-pay

## 2022-09-26 ENCOUNTER — Ambulatory Visit (INDEPENDENT_AMBULATORY_CARE_PROVIDER_SITE_OTHER): Payer: BC Managed Care – PPO | Admitting: Internal Medicine

## 2022-09-26 ENCOUNTER — Encounter (INDEPENDENT_AMBULATORY_CARE_PROVIDER_SITE_OTHER): Payer: Self-pay | Admitting: Internal Medicine

## 2022-09-26 VITALS — BP 118/70 | HR 60 | Temp 97.5°F | Ht 66.0 in | Wt 236.0 lb

## 2022-09-26 DIAGNOSIS — D751 Secondary polycythemia: Secondary | ICD-10-CM | POA: Insufficient documentation

## 2022-09-26 DIAGNOSIS — F5089 Other specified eating disorder: Secondary | ICD-10-CM | POA: Diagnosis not present

## 2022-09-26 DIAGNOSIS — F509 Eating disorder, unspecified: Secondary | ICD-10-CM | POA: Insufficient documentation

## 2022-09-26 DIAGNOSIS — Z6838 Body mass index (BMI) 38.0-38.9, adult: Secondary | ICD-10-CM

## 2022-09-26 DIAGNOSIS — E7800 Pure hypercholesterolemia, unspecified: Secondary | ICD-10-CM

## 2022-09-26 DIAGNOSIS — R748 Abnormal levels of other serum enzymes: Secondary | ICD-10-CM

## 2022-09-26 DIAGNOSIS — E88819 Insulin resistance, unspecified: Secondary | ICD-10-CM | POA: Diagnosis not present

## 2022-09-26 MED ORDER — TIRZEPATIDE 10 MG/0.5ML ~~LOC~~ SOAJ: 10.0000 mg | SUBCUTANEOUS | 3 refills | Status: DC

## 2022-09-26 MED ORDER — TIRZEPATIDE 10 MG/0.5ML ~~LOC~~ SOAJ: 10.0000 mg | SUBCUTANEOUS | 4 refills | Status: DC

## 2022-09-26 MED ORDER — SEMAGLUTIDE-WEIGHT MANAGEMENT 1 MG/0.5ML ~~LOC~~ SOAJ: 1.0000 mg | SUBCUTANEOUS | 0 refills | Status: DC

## 2022-09-26 MED ORDER — PYRIDOXINE HCL 50 MG PO TABS: 50.0000 mg | ORAL_TABLET | Freq: Every day | ORAL | 1 refills | Status: DC

## 2022-09-26 NOTE — Assessment & Plan Note (Signed)
Reviewed most recent lipid panel which looks favorable.  He will continue statin therapy

## 2022-09-26 NOTE — Assessment & Plan Note (Signed)
Patient advised on increasing hemoglobin and RBC mass.  He has a history of heart failure and is on testosterone replacement.  This might increase viscosity levels which may have an effect on his heart failure.  He is to follow-up with prescribing physician.  He also has untreated sleep apnea.

## 2022-09-26 NOTE — Progress Notes (Signed)
Mounjaro compounded with B6 Cannot change drug selection in EHR to escribe - please fill as tirzepatide-pyridoxine /mL-10mg /mL, quantity 3mL, RF 4, sig: injection  under the skin once a week.  Patient already undergone up titration of dose and is not a new start

## 2022-09-26 NOTE — Progress Notes (Signed)
Office: [phone]  /  Fax: [phone]  WEIGHT SUMMARY AND BIOMETRICS  Vitals Temp: (!) 97.5 F (36.4 C) BP: 118/70 Pulse Rate: 60 SpO2: 97 %   Anthropometric Measurements Height:  (1.676 m) Weight: 236 lb (107 kg) BMI (Calculated): 38.11 Weight at Last Visit: 235 lb Weight Lost Since Last Visit: 0 lb Weight Gained Since Last Visit: 0 lb Starting Weight: 235 lb Peak Weight: 245 lb   Body Composition  Body Fat %: 31.4 % Fat Mass (lbs): 74.2 lbs Muscle Mass (lbs): 153.8 lbs Total Body Water (lbs): 110 lbs Visceral Fat Rating : 20    No data recorded Today's Visit #: 2  Starting Date: 09/13/22   HPI  Chief Complaint: OBESITY  Interval History:   This is my second encounter with Mr. Hinkle who presents for a follow-up on obesity.  Patient has been referred to our clinic for assistance medication management.  Patient was not too interested in nutritional and behavioral strategies at the outset.  He also has limited understanding of macronutrients and dietary composition for weight loss purposes.  He acknowledged having problems with strong hunger signals and impaired sense of satiety.  He considers eating until feeling uncomfortably full and also eating large amounts with hungry.  He does not have problems with remembering what he ate or feeling guilty or same.  He denies compensatory purging behaviors.  At present he has reduced sugary drinks has tried to increase more protein but has problems with portion control.  He feels like he is not getting enough food.  It becomes very hard for him to focus on eating healthy and making changes towards a reduced calorie nutrition plan.  Patient had been on incretin therapy for about a year and he found medication very helpful.  Medication treatment was interrupted because of lack of coverage by his insurance.  He has been off the medication now for 6 to 8 weeks and has been very difficult for him.  He has received a  prescription for compounded tirzepatide by cardiology and was currently in the market ready to purchase.   Barriers identified: inability to focus on healthy eating, exposure to enticing environments and or relationships, strong hunger signals and appetite, and difficulty implementing reduced calorie nutrition plan.   Pharmacotherapy for weight loss: He is currently taking no anti-obesity medication.    ASSESSMENT AND PLAN  TREATMENT PLAN FOR OBESITY:  Recommended Dietary Goals  Sheehan is currently in the action stage of change. As such, his goal is to continue weight management plan. He has agreed to: follow a lower carbohydrate, vegetable and lean protein rich diet plan  Behavioral Intervention  We discussed the following Behavioral Modification Strategies today: increasing lean protein intake, decreasing simple carbohydrates , increasing vegetables, increasing lower glycemic fruits, increasing fiber rich foods, increasing water intake, identifying sources and decreasing liquid calories, planning for success, and better snacking choices.  Patient again was counseled on the principles of a comprehensive weight loss program and importance of using a multipronged approach.  Additional resources provided today: None  Recommended Physical Activity Goals  Cas has been advised to work up to 150 minutes of moderate intensity aerobic activity a week and strengthening exercises 2-3 times per week for cardiovascular health, weight loss maintenance and preservation of muscle mass.   He has agreed to :  Think about ways to increase physical activity  Pharmacotherapy We discussed various medication options to help Stepfon with his weight loss efforts and we both agreed  to : Early initiation of anti-obesity medication. In addition to reduced calorie nutrition plan (RCNP), behavioral strategies and physical activity, Taisei would benefit from pharmacotherapy to assist with hunger signals,  satiety and cravings. This will reduce obesity-related health risks by inducing weight loss, and help reduce food consumption and adherence to [facility]) . It may also improve QOL by improving self-confidence and reduce the  setbacks associated with metabolic adaptations.  After discussion of treatment options, mechanisms of action, benefits, side effects, contraindications and shared decision making he is agreeable to starting Wegovy at 1 mg once a week.  He did not want to do up titration of medication and I cautioned him that this could be met with an increased incidence of side effects.  Patient also made aware that medication is indicated for long-term management of obesity and the risk of weight regain following discontinuation of treatment and hence the importance of adhering to medical weight loss plan.  We demonstrated use of device and patient using teach back method was able to demonstrate proper technique.  ASSOCIATED CONDITIONS ADDRESSED TODAY  Class 2 severe obesity with serious comorbidity and body mass index (BMI) of 38.0 to 38.9 in adult, unspecified obesity type -     Semaglutide-Weight Management; Inject 1 mg into the skin once a week.  Dispense: 2 mL; Refill: 0  Other disorder of eating Assessment & Plan: Patient has disordered eating probably for most of his adult life.  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  He does benefit from pharmacotherapy.  He is also agreeable to behavioral therapy but is somewhat skeptical about the benefits.  It will take frequent office visits and some cognitive restructuring to get patient on the right path.  I will likely refer him to Dr. Dewaine Conger at the next office visit if patient agrees.   Polycythemia Assessment & Plan: Patient advised on increasing hemoglobin and RBC mass.  He has a history of heart failure and is on testosterone replacement.  This might increase viscosity levels which may have an effect on his heart  failure.  He is to follow-up with prescribing physician.  He also has untreated sleep apnea.   Pure hypercholesterolemia Assessment & Plan: Reviewed most recent lipid panel which looks favorable.  He will continue statin therapy   Insulin resistance Assessment & Plan: His HOMA-IR is 8.39 which is markedly elevated. Optimal level < 1.9. This is complex condition associated with genetics, ectopic fat and lifestyle factors. Insulin resistance may result in weight gain, abnormal cravings (particularly for carbs) and fatigue. This may result in additional weight gain and lead to pre-diabetes and diabetes if untreated.   Lab Results  Component Value Date   HGBA1C 5.4 09/13/2022   Lab Results  Component Value Date   INSULIN 34.8 (H) 09/13/2022   Lab Results  Component Value Date   GLUCOSE 100 (H) 09/13/2022   GLUCOSE 96 03/09/2008    We reviewed treatment options which include losing 7 to 10% of body weight, increasing physical activity to a 150 minutes a week of moderate intensity.he is also a candidate for incretin therapy or treatment with metformin.  After discussion of benefits and risk we will be started on Wegovy.    Elevated liver enzymes Assessment & Plan: His ALT was 51 with normal AST, protein and bilirubin.  He had a CT scan last year which did not show any hepatic steatosis but he may still have fatty liver disease.  He denies drinking alcohol or  risk factors for viral hepatitis.  He denies taking supplements.  He would benefit from initial evaluation and this will be completed at the next office visit.  We will check hepatitis serologies as well as serum iron studies.  We may also follow-up with a liver ultrasound.      PHYSICAL EXAM:  Blood pressure 118/70, pulse 60, temperature (!) 97.5 F (36.4 C), height  (1.676 m), weight 236 lb (107 kg), SpO2 97 %. Body mass index is 38.09 kg/m.  General: He is overweight, cooperative, alert, well developed, and in no  acute distress. PSYCH: Has normal mood, affect and thought process.   HEENT: EOMI, sclerae are anicteric. Lungs: Normal breathing effort, no conversational dyspnea. Extremities: No edema.  Neurologic: No gross sensory or motor deficits. No tremors or fasciculations noted.    DIAGNOSTIC DATA REVIEWED:  BMET    Component Value Date/Time   NA 140 09/13/2022 0824   K 4.4 09/13/2022 0824   CL 103 09/13/2022 0824   CO2 21 09/13/2022 0824   GLUCOSE 100 (H) 09/13/2022 0824   GLUCOSE 98 06/02/2022 0109   BUN 17 09/13/2022 0824   CREATININE 0.82 09/13/2022 0824   CALCIUM 9.2 09/13/2022 0824   GFRNONAA >60 06/02/2022 0109   GFRAA >60 03/04/2015 0520   Lab Results  Component Value Date   HGBA1C 5.4 09/13/2022   Lab Results  Component Value Date   INSULIN 34.8 (H) 09/13/2022   Lab Results  Component Value Date   TSH 1.780 09/13/2022   CBC    Component Value Date/Time   WBC 8.3 09/13/2022 0824   WBC 9.1 06/02/2022 0109   RBC 5.85 (H) 09/13/2022 0824   RBC 5.07 06/02/2022 0109   HGB 17.1 09/13/2022 0824   HCT 51.4 (H) 09/13/2022 0824   PLT 196 09/13/2022 0824   MCV 88 09/13/2022 0824   MCH 29.2 09/13/2022 0824   MCH 29.4 06/02/2022 0109   MCHC 33.3 09/13/2022 0824   MCHC 33.7 06/02/2022 0109   RDW 13.7 09/13/2022 0824   Iron Studies No results found for: "IRON", "TIBC", "FERRITIN", "IRONPCTSAT" Lipid Panel     Component Value Date/Time   CHOL 158 09/13/2022 0824   TRIG 60 09/13/2022 0824   HDL 58 09/13/2022 0824   CHOLHDL 5 11/04/2008 0829   VLDL 13.2 11/04/2008 0829   LDLCALC 88 09/13/2022 0824   LDLDIRECT 169.1 11/04/2008 0829   Hepatic Function Panel     Component Value Date/Time   PROT 7.1 09/13/2022 0824   ALBUMIN 4.4 09/13/2022 0824   AST 37 09/13/2022 0824   ALT 51 (H) 09/13/2022 0824   ALKPHOS 66 09/13/2022 0824   BILITOT 0.5 09/13/2022 0824   BILIDIR 0.0 11/04/2008 0829      Component Value Date/Time   TSH 1.780 09/13/2022 0824    Nutritional Lab Results  Component Value Date   VD25OH 30.2 09/13/2022   VD25OH 26 (L) 11/04/2008     Return in about 3 weeks (around 10/17/2022) for For Weight Mangement with Dr. Rikki Spearing - needs 40 minutes.Marland Kitchen He was informed of the importance of frequent follow up visits to maximize his success with intensive lifestyle modifications for his multiple health conditions.   ATTESTASTION STATEMENTS:  Reviewed by clinician on day of visit: allergies, medications, problem list, medical history, surgical history, family history, social history, and previous encounter notes.     Worthy Rancher, MD

## 2022-09-26 NOTE — Telephone Encounter (Signed)
Done

## 2022-09-26 NOTE — Assessment & Plan Note (Signed)
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 Optimal level < 1.9. This is complex condition associated with genetics, ectopic fat and lifestyle factors. Insulin resistance may result in weight gain, abnormal cravings (particularly for carbs) and fatigue. This may result in additional weight gain and lead to pre-diabetes and diabetes if untreated.   Lab Results  Component Value Date   HGBA1C 5.4 09/13/2022   Lab Results  Component Value Date   INSULIN 34.8 (H) 09/13/2022   Lab Results  Component Value Date   GLUCOSE 100 (H) 09/13/2022   GLUCOSE 96 03/09/2008    We reviewed treatment options which include losing 7 to 10% of body weight, increasing physical activity to a 150 minutes a week of moderate intensity.he is also a candidate for incretin therapy or treatment with metformin.  After discussion of benefits and risk we will be started on Wegovy.

## 2022-09-26 NOTE — Assessment & Plan Note (Signed)
Patient has disordered eating probably for most of his adult life.  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  He does benefit from pharmacotherapy.  He is also agreeable to behavioral therapy but is somewhat skeptical about the benefits.  It will take frequent office visits and some cognitive restructuring to get patient on the right path.  I will likely refer him to Dr. Dewaine Conger at the next office visit if patient agrees.

## 2022-09-26 NOTE — Telephone Encounter (Signed)
Sure send for 10 mg and also B6

## 2022-09-26 NOTE — Assessment & Plan Note (Signed)
His ALT was 51 with normal AST, protein and bilirubin.  He had a CT scan last year which did not show any hepatic steatosis but he may still have fatty liver disease.  He denies drinking alcohol or risk factors for viral hepatitis.  He denies taking supplements.  He would benefit from initial evaluation and this will be completed at the next office visit.  We will check hepatitis serologies as well as serum iron studies.  We may also follow-up with a liver ultrasound.

## 2022-09-27 NOTE — Progress Notes (Signed)
Remote ICD transmission.   

## 2022-10-24 ENCOUNTER — Telehealth (INDEPENDENT_AMBULATORY_CARE_PROVIDER_SITE_OTHER): Payer: Self-pay | Admitting: Internal Medicine

## 2022-10-25 ENCOUNTER — Ambulatory Visit (INDEPENDENT_AMBULATORY_CARE_PROVIDER_SITE_OTHER): Payer: BC Managed Care – PPO | Admitting: Internal Medicine

## 2022-10-25 ENCOUNTER — Encounter (INDEPENDENT_AMBULATORY_CARE_PROVIDER_SITE_OTHER): Payer: Self-pay | Admitting: Internal Medicine

## 2022-10-25 VITALS — BP 106/67 | HR 67 | Temp 97.6°F | Ht 66.0 in | Wt 224.0 lb

## 2022-10-25 DIAGNOSIS — Z6838 Body mass index (BMI) 38.0-38.9, adult: Secondary | ICD-10-CM

## 2022-10-25 DIAGNOSIS — E66812 Obesity, class 2: Secondary | ICD-10-CM | POA: Insufficient documentation

## 2022-10-25 DIAGNOSIS — R748 Abnormal levels of other serum enzymes: Secondary | ICD-10-CM

## 2022-10-25 DIAGNOSIS — F5089 Other specified eating disorder: Secondary | ICD-10-CM | POA: Diagnosis not present

## 2022-10-25 DIAGNOSIS — G4733 Obstructive sleep apnea (adult) (pediatric): Secondary | ICD-10-CM | POA: Diagnosis not present

## 2022-10-25 DIAGNOSIS — I2510 Atherosclerotic heart disease of native coronary artery without angina pectoris: Secondary | ICD-10-CM | POA: Diagnosis not present

## 2022-10-25 MED ORDER — OZEMPIC (0.25 OR 0.5 MG/DOSE) 2 MG/3ML ~~LOC~~ SOPN: 0.5000 mg | PEN_INJECTOR | SUBCUTANEOUS | 0 refills | Status: DC

## 2022-10-25 NOTE — Assessment & Plan Note (Signed)
Based on records I will review cardiac catheterization to confirm this.  He is on beta-blockers and high intensity statin therapy.  He denies any symptoms of angina.  He is also on guideline based medical therapy for systolic heart failure.

## 2022-10-25 NOTE — Assessment & Plan Note (Signed)
His ALT was 51 with normal AST, protein and bilirubin.  He had a CT scan last year which did not show any hepatic steatosis but he may still have fatty liver disease.  He denies drinking alcohol or risk factors for viral hepatitis.  He denies taking supplements.  He would benefit from initial evaluation and this will be completed at the next office visit.  We will check hepatitis serologies as well as serum iron studies.  We may also follow-up with a liver ultrasound. 

## 2022-10-25 NOTE — Assessment & Plan Note (Signed)
Severe currently untreated patient has intolerance to mask.  Counseled on the risk associated with untreated sleep apnea.  Losing 15% of body weight may improve condition.  Patient benefits from incretin therapy

## 2022-10-25 NOTE — Assessment & Plan Note (Addendum)
Patient has disordered eating probably for most of his adult life.  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  He does benefit from pharmacotherapy.  He is also agreeable to behavioral therapy but is somewhat skeptical about the benefits.  He will be referred to Dr. Dewaine Conger for cognitive behavioral therapy and further assessment.  We will send referral via email as there is not an internal process.

## 2022-10-25 NOTE — Progress Notes (Signed)
Office: [phone]  /  Fax: [phone]  WEIGHT SUMMARY AND BIOMETRICS  Vitals Temp: 97.6 F (36.4 C) BP: 106/67 Pulse Rate: 67 SpO2: 96 %   Anthropometric Measurements Height: 5\' 6"  (1.676 m) Weight: 224 lb (101.6 kg) BMI (Calculated): 36.17 Weight at Last Visit: 236 lb Weight Lost Since Last Visit: 12 lb Starting Weight: 235 lb Total Weight Loss (lbs): 11 lb (4.99 kg) Peak Weight: 245 lb   Body Composition  Body Fat %: 30.3 % Fat Mass (lbs): 67.8 lbs Muscle Mass (lbs): 148.4 lbs Total Body Water (lbs): 102 lbs Visceral Fat Rating : 19    No data recorded Today's Visit #: 3  Starting Date: 09/13/22   HPI  Chief Complaint: OBESITY  [name] is here to discuss his progress with his obesity treatment plan.   Interval History:  Patient is new to our program.  Had been on incretin therapy in the past prescribed by PCP but this was discontinued due to lack of coverage.  He had some results but not had not been following a reduced calorie nutritional plan.  He has been trying really hard to get back on medication and was prescribed compounded tirzepatide by his cardiologist.  He was unable to afford namebrand drug and is asking [place] for assistance with this.  I explained to him the risk associated with using compounded medications and the lack of current regulation.  I also explained that we do not endorse the use of these drugs.  He is very frustrated with his situation as he is eager to lose weight and has benefited from incretin therapy.  Since last office visit he has lost 12 pounds I think he is injecting through his appetite at 7.5 mg denies any adverse effects.  He has cut down on portion sizes has been eating more fruits and vegetables and has reduced sugary drinks.  He also recently tried exercising but injured his back.  Adherence to nutrition plan :  He is watching portions and trying to eat healthier but is not following structured nutritional  plan.  Nutritional: He has been working on not skipping meals, increasing protein intake at every meal, eating more fruits, eating more vegetables, drinking more water, avoiding and or reducing liquid calories, and making healthier choices  Orexigenic Control: [] Denies [x] Reports problems with appetite and hunger signals.  [] Denies [x] Reports problems with satiety and satiation.  [] Denies [x] Reports problems with eating patterns and portion control.  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 Marland Kitchen   Pharmacotherapy for weight loss: He is currently taking  compounded tirzepatide prescribed by cardiologist. .    ASSESSMENT AND PLAN  TREATMENT PLAN FOR OBESITY:  Recommended Dietary Goals  Jeptha is currently in the action stage of change. As such, his goal is to continue weight management plan. He has agreed to: portion control, balanced plate and making smarter food choices, such as increasing vegetables, protein intake and reducing simple carbohydrates and processed foods   Behavioral Intervention  We discussed the following Behavioral Modification Strategies today: increasing lean protein intake, decreasing simple carbohydrates , increasing vegetables, increasing lower glycemic fruits, increasing fiber rich foods, increasing water intake, reading food labels , identifying  sources and decreasing liquid calories, and planning for success.  Additional resources provided today: None  Recommended Physical Activity Goals  Dreu has been advised to work up to 150 minutes of moderate intensity  aerobic activity a week and strengthening exercises 2-3 times per week for cardiovascular health, weight loss maintenance and preservation of muscle mass.   He has agreed to :  Think about ways to increase daily physical activity and overcoming barriers to exercise  Pharmacotherapy We have discussed various medication options to help A[place]on with his weight loss efforts.  He is currently using compounded tirzepatide.  I reviewed with him that we do not endorse the use of compounded drugs in our program and therefore cannot provide him with additional guidance.  He has a history of coronary artery disease as well as chronic systolic heart failure.  So he benefits from weight loss and also incretin therapy.  He also has significant hunger signals and impaired satiety which makes difficult to implement a reduced calorie nutrition plan (RCNP).He has agreed to see our behavioral health specialist as I suspect he may have an underlying eating disorder.  Sasuke would benefit from pharmacotherapy to assist with hunger signals, satiety and cravings. This will reduce obesity-related health risks by inducing weight loss, and help reduce food consumption and adherence to [facility]) . It may also improve QOL by improving self-confidence and reduce the  setbacks associated with metabolic adaptations.  After discussion of treatment options, mechanisms of action, benefits, side effects, contraindications and shared decision making he is agreeable to starting semaglutide 0.5 mg once a week. Patient also made aware that medication is indicated for long-term management of obesity and the risk of weight regain following discontinuation of treatment and hence the importance of adhering to medical weight loss plan.  We demonstrated use of device and patient using teach back method was able to demonstrate proper technique.  ASSOCIATED CONDITIONS ADDRESSED TODAY  Coronary artery disease, unspecified vessel or lesion type,  unspecified whether angina present, unspecified whether native or transplanted heart Assessment & Plan: Based on records I will review cardiac catheterization to confirm this.  He is on beta-blockers and high intensity statin therapy.  He denies any symptoms of angina.  He is also on guideline based medical therapy for systolic heart failure.  Orders: -     Ozempic (0.25 or 0.5 MG/DOSE); Inject 0.5 mg into the skin once a week.  Dispense: 3 mL; Refill: 0  Class 2 severe obesity with serious comorbidity and body mass index (BMI) of 38.0 to 38.9 in adult, unspecified obesity type (HCC)  Obstructive sleep apnea Assessment & Plan: Severe currently untreated patient has intolerance to mask.  Counseled on the risk associated with untreated sleep apnea.  Losing 15% of body weight may improve condition.  Patient benefits from incretin therapy   Other disorder of eating Assessment & Plan: Patient has disordered eating probably for most of his adult life.  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  He does benefit from pharmacotherapy.  He is also agreeable to behavioral therapy but is somewhat skeptical about the benefits.  He will be referred to Dr. Dewaine Conger for cognitive behavioral therapy and further assessment.  We will send referral via email as there is not an internal process.   Elevated liver enzymes Assessment & Plan: His ALT was 51 with normal AST, protein and bilirubin.  He had a CT scan last year which did not show any hepatic steatosis but he may  still have fatty liver disease.  He denies drinking alcohol or risk factors for viral hepatitis.  He denies taking supplements.  He would benefit from initial evaluation and this will be completed at the next office visit.  We will check hepatitis serologies as well as serum iron studies.  We may also follow-up with a liver ultrasound.     PHYSICAL EXAM:  Blood pressure 106/67, pulse 67, temperature 97.6 F (36.4 C),  height 5\' 6"  (1.676 m), weight 224 lb (101.6 kg), SpO2 96 %. Body mass index is 36.15 kg/m.  General: He is overweight, cooperative, alert, well developed, and in no acute distress. PSYCH: Has normal mood, affect and thought process.   HEENT: EOMI, sclerae are anicteric. Lungs: Normal breathing effort, no conversational dyspnea. Extremities: No edema.  Neurologic: No gross sensory or motor deficits. No tremors or fasciculations noted.    DIAGNOSTIC DATA REVIEWED:  BMET    Component Value Date/Time   NA 140 09/13/2022 0824   K 4.4 09/13/2022 0824   CL 103 09/13/2022 0824   CO2 21 09/13/2022 0824   GLUCOSE 100 (H) 09/13/2022 0824   GLUCOSE 98 06/02/2022 0109   BUN 17 09/13/2022 0824   CREATININE 0.82 09/13/2022 0824   CALCIUM 9.2 09/13/2022 0824   GFRNONAA >60 06/02/2022 0109   GFRAA >60 03/04/2015 0520   EGFR 98 09/13/2022 0824   Lab Results  Component Value Date   HGBA1C 5.4 09/13/2022   Lab Results  Component Value Date   INSULIN 34.8 (H) 09/13/2022   Lab Results  Component Value Date   TSH 1.780 09/13/2022   CBC    Component Value Date/Time   WBC 8.3 09/13/2022 0824   WBC 9.1 06/02/2022 0109   RBC 5.85 (H) 09/13/2022 0824   RBC 5.07 06/02/2022 0109   HGB 17.1 09/13/2022 0824   HCT 51.4 (H) 09/13/2022 0824   PLT 196 09/13/2022 0824   MCV 88 09/13/2022 0824   MCH 29.2 09/13/2022 0824   MCH 29.4 06/02/2022 0109   MCHC 33.3 09/13/2022 0824   MCHC 33.7 06/02/2022 0109   RDW 13.7 09/13/2022 0824   Iron Studies No results found for: "IRON", "TIBC", "FERRITIN", "IRONPCTSAT" Lipid Panel     Component Value Date/Time   CHOL 158 09/13/2022 0824   TRIG 60 09/13/2022 0824   HDL 58 09/13/2022 0824   CHOLHDL 5 11/04/2008 0829   VLDL 13.2 11/04/2008 0829   LDLCALC 88 09/13/2022 0824   LDLDIRECT 169.1 11/04/2008 0829   Hepatic Function Panel     Component Value Date/Time   PROT 7.1 09/13/2022 0824   ALBUMIN 4.4 09/13/2022 0824   AST 37 09/13/2022 0824    ALT 51 (H) 09/13/2022 0824   ALKPHOS 66 09/13/2022 0824   BILITOT 0.5 09/13/2022 0824   BILIDIR 0.0 11/04/2008 0829      Component Value Date/Time   TSH 1.780 09/13/2022 0824   Nutritional Lab Results  Component Value Date   VD25OH 30.2 09/13/2022   VD25OH 26 (L) 11/04/2008     Return in about 2 weeks (around 11/08/2022) for For Weight Mangement with Dr. Rikki Spearing - 40 minutes.Marland Kitchen He was informed of the importance of frequent follow up visits to maximize his success with intensive lifestyle modifications for his multiple health conditions.   ATTESTASTION STATEMENTS:  Reviewed by clinician on day of visit: allergies, medications, problem list, medical history, surgical history, family history, social history, and previous encounter notes.   I have spent 40 minutes in the care  of the patient today including: preparing to see patient (e.g. review and interpretation of tests, old notes ), counseling and educating the patient, ordering medications, test and procedures, documenting clinical information in the electronic or other health care record, independently interpreting results and communicating results to the patient,family, or caregiver, and care coordination   Worthy Rancher, MD

## 2022-11-06 ENCOUNTER — Other Ambulatory Visit: Payer: Self-pay | Admitting: Cardiology

## 2022-11-06 DIAGNOSIS — I420 Dilated cardiomyopathy: Secondary | ICD-10-CM

## 2022-11-06 MED ORDER — TIRZEPATIDE 10 MG/0.5ML ~~LOC~~ SOAJ: 15.0000 mg | SUBCUTANEOUS | 3 refills | Status: DC

## 2022-11-06 MED ORDER — ZEPBOUND 15 MG/0.5ML ~~LOC~~ SOAJ: 15.0000 mg | SUBCUTANEOUS | 3 refills | Status: DC

## 2022-11-06 NOTE — Progress Notes (Addendum)
ICD-10-CM   1. Class 2 severe obesity due to excess calories with serious comorbidity and body mass index (BMI) of 35.0 to 35.9 in adult (HCC)  E66.01 tirzepatide (MOUNJARO) 10 MG/0.5ML Pen   Z68.35 DISCONTINUED: tirzepatide (ZEPBOUND) 15 MG/0.5ML Pen    2. Dilated cardiomyopathy (HCC)  I42.0 tirzepatide (MOUNJARO) 10 MG/0.5ML Pen    DISCONTINUED: tirzepatide (ZEPBOUND) 15 MG/0.5ML Pen     Meds ordered this encounter  Medications   DISCONTD: tirzepatide (ZEPBOUND) 15 MG/0.5ML Pen    Sig: Inject 15 mg into the skin once a week.    Dispense:  12 mL    Refill:  3   tirzepatide (MOUNJARO) 10 MG/0.5ML Pen    Sig: Inject 15 mg into the skin once a week.    Dispense:  9 mL    Refill:  3

## 2022-11-06 NOTE — Addendum Note (Signed)
Addended by: Delrae Rend on: 11/06/2022 05:50 PM   Modules accepted: Orders

## 2022-11-13 ENCOUNTER — Other Ambulatory Visit: Payer: Self-pay | Admitting: Cardiology

## 2022-11-13 DIAGNOSIS — I420 Dilated cardiomyopathy: Secondary | ICD-10-CM

## 2022-11-13 MED ORDER — MOUNJARO 15 MG/0.5ML ~~LOC~~ SOAJ: 15.0000 mg | SUBCUTANEOUS | 3 refills | Status: DC

## 2022-11-14 ENCOUNTER — Other Ambulatory Visit: Payer: Self-pay | Admitting: Cardiology

## 2022-11-14 DIAGNOSIS — I5022 Chronic systolic (congestive) heart failure: Secondary | ICD-10-CM

## 2022-11-14 MED ORDER — ZEPBOUND 15 MG/0.5ML ~~LOC~~ SOAJ: 15.0000 mg | SUBCUTANEOUS | 3 refills | Status: DC

## 2022-11-21 ENCOUNTER — Ambulatory Visit (INDEPENDENT_AMBULATORY_CARE_PROVIDER_SITE_OTHER): Payer: BC Managed Care – PPO | Admitting: Internal Medicine

## 2022-11-21 ENCOUNTER — Encounter (INDEPENDENT_AMBULATORY_CARE_PROVIDER_SITE_OTHER): Payer: Self-pay | Admitting: Internal Medicine

## 2022-11-21 VITALS — BP 114/70 | HR 77 | Temp 98.7°F | Ht 66.0 in | Wt 224.0 lb

## 2022-11-21 DIAGNOSIS — Z6836 Body mass index (BMI) 36.0-36.9, adult: Secondary | ICD-10-CM

## 2022-11-21 DIAGNOSIS — M545 Low back pain, unspecified: Secondary | ICD-10-CM | POA: Diagnosis not present

## 2022-11-21 DIAGNOSIS — R632 Polyphagia: Secondary | ICD-10-CM

## 2022-11-21 MED ORDER — TOPIRAMATE 25 MG PO TABS: 25.0000 mg | ORAL_TABLET | Freq: Every day | ORAL | 0 refills | Status: AC

## 2022-11-21 NOTE — Assessment & Plan Note (Signed)
Based on records I will review cardiac catheterization to confirm this.  He is on beta-blockers and high intensity statin therapy.  He denies any symptoms of angina.  He is also on guideline based medical therapy for systolic heart failure. 

## 2022-11-21 NOTE — Progress Notes (Signed)
Office: [phone]  /  Fax: [phone]  WEIGHT SUMMARY AND BIOMETRICS  Vitals Temp: 98.7 F (37.1 C) BP: 114/70 Pulse Rate: 77 SpO2: 96 %   Anthropometric Measurements Height: 5\' 6"  (1.676 m) Weight: 224 lb (101.6 kg) BMI (Calculated): 36.17 Weight at Last Visit: 224 lb Weight Lost Since Last Visit: 0 lb Weight Gained Since Last Visit: 0 lb Starting Weight: 235 lb Total Weight Loss (lbs): 11 lb (4.99 kg) Peak Weight: 245 lb   Body Composition  Body Fat %: 30.7 % Fat Mass (lbs): 68.8 lbs Muscle Mass (lbs): 147.8 lbs Total Body Water (lbs): 107 lbs Visceral Fat Rating : 19    No data recorded Today's Visit #: 4  Starting Date: 09/13/22   HPI  Chief Complaint: OBESITY  [name] is here to discuss his progress with his obesity treatment plan. He is on ess carbs and states he is following his eating plan approximately 80 % of the time. He states he is not exercising.  Interval History:  Since last office visit he has maintained.  Last office visit have referred him to psychologist for disordered eating but appointment has not been scheduled.  His insurance has denied [place] and Ozempic.  He had been taking compounded tirzepatide, prescribed by cardiologist.  He reports [facility] [place].  I had explained to him that I do not know what their FDA status is and whether they have a certificate of analysis for compounded tirzepatide.   He is a Sports administrator.  Has been working on increasing protein intake, has reduced diet soft drinks and has been drinking more water.  Unfortunately he hurt his back exercising.  He has chronic back problems for which he is seeing a chiropractor and possibly a Writer.  He is wanting to hold off surgery.  There is no reports of leg weakness changes in bowel or urinary function.  Orixegenic Control: Reports problems with appetite and hunger signals.  Denies problems with satiety and  satiation.  Denies problems with eating patterns and portion control.  Denies abnormal cravings. Denies feeling deprived or restricted.   Barriers identified: strong hunger signals and appetite, exposure to enticing environments and or relationships, orthopedic problems, medical conditions or chronic pain affecting mobility, cost of medication, and work schedule.   Pharmacotherapy for weight loss: He is currently taking  on and off compounded tir[facility]epatide from [facility] in [place] .    ASSESSMENT AND PLAN  TREATMENT PLAN FOR OBESITY:  Recommended Dietary Goals  [name] is currently in the action stage of change. As such, his goal is to continue weight management plan. He has agreed to: continue current plan  Behavioral Intervention  We discussed the following Behavioral Modification Strategies today: increasing lean protein intake, decreasing simple carbohydrates , increasing vegetables, increasing lower glycemic fruits, increasing fiber rich foods, avoiding skipping meals, increasing water intake, continue to practice mindfulness when eating, planning for success, and follow-up with behavioral health specialist for disordered eating. .  Additional resources provided today:  Back conditioning program from the [employer]  Recommended Physical Activity Goals  [name] has been advised to work up to 150 minutes of moderate intensity aerobic activity a week and strengthening exercises 2-3 times per week for cardiovascular health, weight loss maintenance and preservation of muscle mass.   He has agreed to :  Think about ways to increase daily physical activity and overcoming barriers to exercise, Increase physical activity in their  day and reduce sedentary time (increase NEAT)., and Unable to participate in physical activity at present due to medical conditions   Pharmacotherapy We discussed various medication options to help Adi with his weight  loss efforts and we both agreed to : he may opt to continue current anti-obesity medication regimen per prescribing physician.  Patient advised to check on FDA status of pharmacy and also to have a certificate of analysis for compounded drug if he wishes to continue medication.  We have explained several times that we do not sponsor the use of compounded medications out of patient's safety concerns.  Patient does struggle with disorder eating particularly in the evening.  After discussion of benefits and side effects he will be started on topiramate 25 mg in the evening.  ASSOCIATED CONDITIONS ADDRESSED TODAY  Polyphagia Assessment & Plan: Patient has significant polyphagia.  He has been referred to behavioral health specialist for cognitive behavioral therapy.  After discussion of benefits and side effects he will be started on topiramate 25 mg 1 tablet in the evening.  Orders: -     Topiramate; Take 1 tablet (25 mg total) by mouth daily.  Dispense: 30 tablet; Refill: 0  Chronic bilateral low back pain without sciatica Assessment & Plan: Affecting mobility and exercise capacity.  We discussed doing low impact activities.  He was also provided with a back conditioning program from the [employer] of [employer] surgeons for back conditioning.  He will follow-up with his primary care physician if his back pain does not improve or if he develops alarming symptoms.  No red flags at present time   Class 2 severe obesity with serious comorbidity and body mass index (BMI) of 38.0 to 38.9 in adult, unspecified obesity type (HCC)    PHYSICAL EXAM:  Blood pressure 114/70, pulse 77, temperature 98.7 F (37.1 C), height 5\' 6"  (1.676 m), weight 224 lb (101.6 kg), SpO2 96 %. Body mass index is 36.15 kg/m.  General: He is overweight, cooperative, alert, well developed, and in no acute distress. PSYCH: Has normal mood, affect and thought process.   HEENT: EOMI, sclerae are anicteric. Lungs: Normal  breathing effort, no conversational dyspnea. Extremities: No edema.  Neurologic: No gross sensory or motor deficits. No tremors or fasciculations noted.    DIAGNOSTIC DATA REVIEWED:  BMET    Component Value Date/Time   NA 140 09/13/2022 0824   K 4.4 09/13/2022 0824   CL 103 09/13/2022 0824   CO2 21 09/13/2022 0824   GLUCOSE 100 (H) 09/13/2022 0824   GLUCOSE 98 06/02/2022 0109   BUN 17 09/13/2022 0824   CREATININE 0.82 09/13/2022 0824   CALCIUM 9.2 09/13/2022 0824   GFRNONAA >60 06/02/2022 0109   GFRAA >60 03/04/2015 0520   Lab Results  Component Value Date   HGBA1C 5.4 09/13/2022   Lab Results  Component Value Date   INSULIN 34.8 (H) 09/13/2022   Lab Results  Component Value Date   TSH 1.780 09/13/2022   CBC    Component Value Date/Time   WBC 8.3 09/13/2022 0824   WBC 9.1 06/02/2022 0109   RBC 5.85 (H) 09/13/2022 0824   RBC 5.07 06/02/2022 0109   HGB 17.1 09/13/2022 0824   HCT 51.4 (H) 09/13/2022 0824   PLT 196 09/13/2022 0824   MCV 88 09/13/2022 0824   MCH 29.2 09/13/2022 0824   MCH 29.4 06/02/2022 0109   MCHC 33.3 09/13/2022 0824   MCHC 33.7 06/02/2022 0109   RDW 13.7 09/13/2022 0824  Iron Studies No results found for: "IRON", "TIBC", "FERRITIN", "IRONPCTSAT" Lipid Panel     Component Value Date/Time   CHOL 158 09/13/2022 0824   TRIG 60 09/13/2022 0824   HDL 58 09/13/2022 0824   CHOLHDL 5 11/04/2008 0829   VLDL 13.2 11/04/2008 0829   LDLCALC 88 09/13/2022 0824   LDLDIRECT 169.1 11/04/2008 0829   Hepatic Function Panel     Component Value Date/Time   PROT 7.1 09/13/2022 0824   ALBUMIN 4.4 09/13/2022 0824   AST 37 09/13/2022 0824   ALT 51 (H) 09/13/2022 0824   ALKPHOS 66 09/13/2022 0824   BILITOT 0.5 09/13/2022 0824   BILIDIR 0.0 11/04/2008 0829      Component Value Date/Time   TSH 1.780 09/13/2022 0824   Nutritional Lab Results  Component Value Date   VD25OH 30.2 09/13/2022   VD25OH 26 (L) 11/04/2008     Return in about 2 weeks  (around 12/05/2022) for For Weight Mangement with Dr. Rikki Spearing.Marland Kitchen He was informed of the importance of frequent follow up visits to maximize his success with intensive lifestyle modifications for his multiple health conditions.   ATTESTASTION STATEMENTS:  Reviewed by clinician on day of visit: allergies, medications, problem list, medical history, surgical history, family history, social history, and previous encounter notes.     Worthy Rancher, MD

## 2022-11-21 NOTE — Assessment & Plan Note (Signed)
Affecting mobility and exercise capacity.  We discussed doing low impact activities.  He was also provided with a back conditioning program from the [employer] of [employer] surgeons for back conditioning.  He will follow-up with his primary care physician if his back pain does not improve or if he develops alarming symptoms.  No red flags at present time

## 2022-11-21 NOTE — Assessment & Plan Note (Signed)
Patient has significant polyphagia.  He has been referred to behavioral health specialist for cognitive behavioral therapy.  After discussion of benefits and side effects he will be started on topiramate 25 mg 1 tablet in the evening.

## 2022-11-29 ENCOUNTER — Other Ambulatory Visit: Payer: Self-pay

## 2022-11-29 ENCOUNTER — Telehealth: Payer: Self-pay

## 2022-11-29 ENCOUNTER — Other Ambulatory Visit: Payer: Self-pay | Admitting: Cardiology

## 2022-11-29 DIAGNOSIS — I5022 Chronic systolic (congestive) heart failure: Secondary | ICD-10-CM

## 2022-11-29 DIAGNOSIS — Z6835 Body mass index (BMI) 35.0-35.9, adult: Secondary | ICD-10-CM

## 2022-11-29 MED ORDER — ZEPBOUND 15 MG/0.5ML ~~LOC~~ SOAJ: 15.0000 mg | SUBCUTANEOUS | 3 refills | Status: DC

## 2022-11-29 NOTE — Telephone Encounter (Signed)
Pharmacy is confused as to which injection he should be taking Zepbound, ozempic or Mounjaro compounded with B6 Says 2 different doctors are prescribing these. I only see Zepbound. Can you please confirm.

## 2022-11-29 NOTE — Telephone Encounter (Signed)
Pharmacy doses not see that patient was started on low dose 2.5 of Zepbound. They will not fill the 15mg  ordered until he has completed the low dose first. Also pharmacy says patient is trying to pickup ozempic says he is taking this with the Zepound.

## 2022-12-13 ENCOUNTER — Other Ambulatory Visit (INDEPENDENT_AMBULATORY_CARE_PROVIDER_SITE_OTHER): Payer: Self-pay | Admitting: Internal Medicine

## 2022-12-13 DIAGNOSIS — R632 Polyphagia: Secondary | ICD-10-CM
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
## 2022-12-26 ENCOUNTER — Telehealth (INDEPENDENT_AMBULATORY_CARE_PROVIDER_SITE_OTHER): Payer: BC Managed Care – PPO | Admitting: Psychology

## 2022-12-26 ENCOUNTER — Telehealth (INDEPENDENT_AMBULATORY_CARE_PROVIDER_SITE_OTHER): Payer: Self-pay | Admitting: Psychology
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
## 2022-12-28 ENCOUNTER — Ambulatory Visit (INDEPENDENT_AMBULATORY_CARE_PROVIDER_SITE_OTHER): Payer: BC Managed Care – PPO | Admitting: Internal Medicine

## 2023-01-09 ENCOUNTER — Other Ambulatory Visit (INDEPENDENT_AMBULATORY_CARE_PROVIDER_SITE_OTHER): Payer: Self-pay | Admitting: Internal Medicine

## 2023-01-09 ENCOUNTER — Telehealth (INDEPENDENT_AMBULATORY_CARE_PROVIDER_SITE_OTHER): Payer: Self-pay | Admitting: Psychology

## 2023-01-09 DIAGNOSIS — R632 Polyphagia: Secondary | ICD-10-CM

## 2023-01-09 NOTE — Telephone Encounter (Signed)
  Office: [phone]  /  Fax: [phone]  Date of Call: January 09, 2023  Time of Call: 4:40pm Duration of Call: 1.5 minute(s) Provider: Lawerance Cruel, PsyD  CONTENT: This provider called Marcy Salvo to provider a sooner appointment as previously discussed. Everard was receptive. Directions on connecting to the appointment were provided. No evidence or endorsement of any safety concerns. All questions/concerns addressed.   PLAN:  Prudencio is scheduled for an appointment on 01/16/2023 at 3pm via MyChart Video Visit.

## 2023-01-16 ENCOUNTER — Telehealth (INDEPENDENT_AMBULATORY_CARE_PROVIDER_SITE_OTHER): Payer: BC Managed Care – PPO | Admitting: Psychology

## 2023-01-16 DIAGNOSIS — F5089 Other specified eating disorder: Secondary | ICD-10-CM | POA: Diagnosis not present

## 2023-01-16 NOTE — Progress Notes (Signed)
Office: [phone]  /  Fax: [phone]    Date: January 16, 2023    Appointment Start Time: 3:00pm Duration: 51 minutes Provider: Lawerance Cruel, Psy.D. Type of Session: Intake for Individual Therapy  Location of Patient: Work (address obtained; private location) Location of Provider: Provider's home (private office) Type of Contact: Telepsychological Visit via MyChart Video Visit  Informed Consent: Prior to proceeding with today's appointment, two pieces of identifying information were obtained. In addition, Caylin's physical location at the time of this appointment was obtained as well a phone number he could be reached at in the event of technical difficulties. Marcy Salvo and this provider participated in today's telepsychological service.   The provider's role was explained to [employer]. The provider reviewed and discussed issues of confidentiality, privacy, and limits therein (e.g., reporting obligations). In addition to verbal informed consent, written informed consent for psychological services was obtained prior to the initial appointment. Since the clinic is not a 24/7 crisis center, mental health emergency resources were shared and this  provider explained MyChart, e-mail, voicemail, and/or other messaging systems should be utilized only for non-emergency reasons. This provider also explained that information obtained during appointments will be placed in [name]'s medical record and relevant information will be shared with other providers at [facility] at any locations for coordination of care. [name] agreed information may be shared with other [facility] providers as needed for coordination of care and by signing the service agreement document, he provided written consent for coordination of care. Prior to initiating telepsychological services, Tannor completed an informed consent document, which included the development of a safety plan (i.e., an  emergency contact and emergency resources) in the event of an emergency/crisis. Jordany verbally acknowledged understanding he is ultimately responsible for understanding his insurance benefits for telepsychological and in-person services. This provider also reviewed confidentiality, as it relates to telepsychological services. Weikko  acknowledged understanding that appointments cannot be recorded without both party consent and he is aware he is responsible for securing confidentiality on his end of the session. Tiree verbally consented to proceed.  Of note, Rayyan's wife is a proxy user for his MyChart account. Limits of confidentiality were discussed. He acknowledged understanding and provided verbal consent to proceed.   Chief Complaint/HPI: [name] was referred by Dr. Harriet Masson due to other disorder of eating. Per the note for the visit with Dr. Harriet Masson on 10/25/2022, "Patient has disordered eating probably for most of his adult life. 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 He does benefit from pharmacotherapy. He is also agreeable to behavioral therapy but is somewhat skeptical about the benefits. He will be referred to Dr. Dewaine Conger for cognitive behavioral therapy and further assessment. We will send referral via email as there is not an internal process."   During today's appointment, [name] was verbally administered a questionnaire assessing various behaviors related to emotional eating behaviors. [name] endorsed the following: overeat when you are celebrating, experience food cravings on a regular basis, use food to help you cope with emotional situations, find food is comforting to you, and overeat when you are alone, but eat much less when you are with other people. He shared he craves sweets (e.g., ice cream). [name] believes the onset of emotional eating behaviors was likely in childhood, and described the current frequency of emotional eating  behaviors as "daily." In addition, [name] denied a history of binge eating behaviors. [name] denied a history of significantly restricting food intake,  purging and engagement in other compensatory strategies for weight loss, and has never been diagnosed with an eating disorder. He also denied a history of treatment for emotional eating behaviors. Currently, [name] indicated he enjoys desserts after a meal. He further discussed work towards reducing carb intake.   Mental Status Examination:  Appearance: neat Behavior: appropriate to circumstances Mood: neutral Affect: mood congruent Speech: WNL Eye Contact: intermittent  Psychomotor Activity: WNL Gait: unable to assess  Thought Process: linear, logical, and goal directed and denies suicidal, homicidal, and self-harm ideation, plan and intent  Thought Content/Perception: no hallucinations, delusions, bizarre thinking or behavior endorsed or observed Orientation: AAOx4 Memory/Concentration: intact Insight/Judgment: fair  Family & Psychosocial History: [name] reported he is married and does not have any children. 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 Additionally, [name] shared his highest level of education obtained is a bachelor's degree. Currently, [name]'s social support system consists of his wife and couple of friends. Moreover, [name] stated he resides with his wife.   Medical History:  Past Medical History:  Diagnosis Date   ADD (attention deficit disorder)    ARTHRITIS    Arthritis    Back pain    Chest pain    CHF (congestive heart failure) (HCC)    CHONDROMALACIA PATELLA, LEFT    Edema of both lower extremities    EXOGENOUS OBESITY    GOUT    HIP REPLACEMENT, BILATERAL, HX OF    Hypertension    ICD BiV ICD Abbott GALLANT 05/26/2022 09/05/2022   Joint pain    Obstructive apnea    patient could not tolerate CPAP due to nasal airflow restriction.    Palpitations    Sleep apnea    SOB (shortness of  breath)    Past Surgical History:  Procedure Laterality Date   BIV ICD INSERTION CRT-D N/A 05/26/2022   Procedure: BIV ICD INSERTION CRT-D;  Surgeon: Maurice Small, MD;  Location: [facility] INVASIVE CV LAB;  Service: Cardiovascular;  Laterality: N/A;   JOINT REPLACEMENT     LEAD REVISION/REPAIR N/A 06/01/2022   Procedure: LEAD REVISION/REPAIR;  Surgeon: Lanier Prude, MD;  Location: [facility] INVASIVE CV LAB;  Service: Cardiovascular;  Laterality: N/A;   NASAL SINUS SURGERY     Dr . Haroldine Laws , October 2014 , nasal septum    RIGHT/LEFT HEART CATH AND CORONARY ANGIOGRAPHY N/A 05/05/2022   Procedure: RIGHT/LEFT HEART CATH AND CORONARY ANGIOGRAPHY;  Surgeon: Yates Decamp, MD;  Location: [facility] INVASIVE CV LAB;  Service: Cardiovascular;  Laterality: N/A;   TOTAL HIP REVISION Right 03/03/2015   Procedure: RIGHT HIP BEARING SURFACE REVISION;  Surgeon: Ollen Gross, MD;  Location: [facility] ORS;  Service: Orthopedics;  Laterality: Right;   Current Outpatient Medications on File Prior to Visit  Medication Sig Dispense Refill   allopurinol (ZYLOPRIM) 100 MG tablet Take 100 mg by mouth daily.     carvedilol (COREG) 25 MG tablet TAKE 1 TABLET BY MOUTH TWICE A DAY 180 tablet 2   cyclobenzaprine (FLEXERIL) 10 MG tablet Take 1 tablet (10 mg total) by mouth 3 (three) times daily as needed for muscle spasms. 30 tablet 0   ENTRESTO 97-103 MG Take 1 tablet by mouth 2 (two) times daily. 180 tablet 3   Multiple Vitamins-Minerals (MULTIVITAMIN WITH MINERALS) tablet Take 1 tablet by mouth daily.     Multiple Vitamins-Minerals (ZINC PO) Take 1 tablet by mouth daily.     oxyCODONE-acetaminophen (PERCOCET) 10-325 MG tablet Take 1 tablet by mouth every 4 (  four) hours as needed for pain. 30 tablet 0   rosuvastatin (CRESTOR) 20 MG tablet Take 1 tablet (20 mg total) by mouth daily. 90 tablet 3   spironolactone (ALDACTONE) 25 MG tablet Take 1 tablet (25 mg total) by mouth daily. 90 tablet 3   tadalafil (CIALIS) 5 MG tablet Take 5 mg by  mouth at bedtime.     tamsulosin (FLOMAX) 0.4 MG CAPS capsule Take 0.4 mg by mouth at bedtime.     testosterone cypionate (DEPOTESTOSTERONE CYPIONATE) 200 MG/ML injection Inject 200 mg into the muscle once a week.     tirzepatide (ZEPBOUND) 15 MG/0.5ML Pen Inject 15 mg into the skin once a week. 6 mL 3   topiramate (TOPAMAX) 25 MG tablet Take 1 tablet (25 mg total) by mouth daily. 30 tablet 0   No current facility-administered medications on file prior to visit.  Menno stated he is medication compliant.   Mental Health History: Luverne reported he has never attended therapeutic services. He is unsure where the diagnosis of ADD came from. He denied a history of psychotropic medications. Tavarous reported there is no history of hospitalizations for psychiatric concerns. Jabrian denied a family history of mental health/substance abuse related concerns. Furthermore, Perryn reported there is no history of trauma including psychological, physical , and sexual abuse, as well as neglect.   Jacorey described his typical mood lately as stressed due to [employer] business and health. Malic denied current alcohol use. He denied tobacco use. He denied illicit/recreational substance use. Furthermore, Marcy Salvo indicated he is not experiencing the following: hallucinations and delusions, paranoia, symptoms of mania , social withdrawal, crying spells, panic attacks, memory concerns, attention and concentration issues, and obsessions and compulsions. He also denied history of and current suicidal ideation, plan, and intent; history of and current homicidal ideation, plan, and intent; and history of and current engagement in self-harm.  Legal History: Nichloas reported there is no history of legal involvement.   Structured Assessments Results: The Patient Health Questionnaire-9 (PHQ-9) is a self-report measure that assesses symptoms and severity of depression over the course of the last two weeks. Khori obtained  a score of 5 suggesting mild depression. Donell finds the endorsed symptoms to be not difficult at all. [0= Not at all; 1= Several days; 2= More than half the days; 3= Nearly every day] Little interest or pleasure in doing things 0  Feeling down, depressed, or hopeless 0  Trouble falling or staying asleep, or sleeping too much 0  Feeling tired or having little energy-attributes it to medical hx 3  Poor appetite or overeating 2  Feeling bad about yourself --- or that you are a failure or have let yourself or your family down 0  Trouble concentrating on things, such as reading the newspaper or watching television 0  Moving or speaking so slowly that other people could have noticed? Or the opposite --- being so fidgety or restless that you have been moving around a lot more than usual 0  Thoughts that you would be better off dead or hurting yourself in some way 0  PHQ-9 Score 5    The Generalized Anxiety Disorder-7 (GAD-7) is a brief self-report measure that assesses symptoms of anxiety over the course of the last two weeks. Quentavious obtained a score of 0. [0= Not at all; 1= Several days; 2= Over half the days; 3= Nearly every day] Feeling nervous, anxious, on edge 0  Not being able to stop or control worrying 0  Worrying too  much about different things 0  Trouble relaxing 0  Being so restless that it's hard to sit still 0  Becoming easily annoyed or irritable 0  Feeling afraid as if something awful might happen 0  GAD-7 Score 0   Interventions:  Conducted a chart review Focused on rapport building Verbally administered PHQ-9 and GAD-7 for symptom monitoring Verbally administered Food & Mood questionnaire to assess various behaviors related to emotional eating Provided emphatic reflections and validation Psychoeducation provided regarding physical versus emotional hunger  Diagnostic Impressions & Provisional DSM-5 Diagnosis(es): Kaelem reported a history of engagement in emotional  eating behaviors starting in childhood and described the current frequency as daily. He denied engagement in any other disordered eating behaviors. Based on the aforementioned, the following diagnosis was assigned: F50.89 Other Specified Feeding or Eating Disorder, Emotional Eating Behaviors.  Plan: Makay appears able and willing to participate as evidenced by engagement in reciprocal conversation and asking questions as needed for clarification. The next appointment is scheduled for 02/13/2023 at 4pm, which will be via MyChart Video Visit. The following treatment goal was established: increase coping skills. This provider will regularly review the treatment plan and medical chart to keep informed of status changes. Kaedyn expressed understanding and agreement with the initial treatment plan of care. Asaf will be sent a handout via e-mail to utilize between now and the next appointment to increase awareness of hunger patterns and subsequent eating. Marcy Salvo provided verbal consent during today's appointment for this provider to send the handout via e-mail.

## 2023-01-17 NOTE — Telephone Encounter (Signed)
done

## 2023-01-30 ENCOUNTER — Telehealth (INDEPENDENT_AMBULATORY_CARE_PROVIDER_SITE_OTHER): Payer: BC Managed Care – PPO | Admitting: Psychology

## 2023-02-13 ENCOUNTER — Telehealth (INDEPENDENT_AMBULATORY_CARE_PROVIDER_SITE_OTHER): Payer: BC Managed Care – PPO | Admitting: Psychology

## 2023-02-13 ENCOUNTER — Other Ambulatory Visit: Payer: Self-pay | Admitting: Cardiology

## 2023-02-13 DIAGNOSIS — I5022 Chronic systolic (congestive) heart failure: Secondary | ICD-10-CM

## 2023-02-20 ENCOUNTER — Telehealth (INDEPENDENT_AMBULATORY_CARE_PROVIDER_SITE_OTHER): Payer: Medicare HMO | Admitting: Psychology

## 2023-02-20 DIAGNOSIS — F5089 Other specified eating disorder: Secondary | ICD-10-CM | POA: Diagnosis not present

## 2023-02-20 NOTE — Progress Notes (Signed)
  Office: [phone]  /  Fax: [phone]    Date: February 20, 2023  Appointment Start Time: 9:04am Duration: 26 minutes Provider: Lawerance Cruel, Psy.D. Type of Session: Individual Therapy  Location of Patient: Work (private location) Location of Provider: Provider's Home (private office) Type of Contact: Telepsychological Visit via MyChart Video Visit  Session Content: [name] is a 67 y.o. male presenting for a follow-up appointment to address the previously established treatment goal of increasing coping skills.Today's appointment was a telepsychological visit. [name] provided verbal consent for today's telepsychological appointment and he is aware he is responsible for securing confidentiality on his end of the session. Prior to proceeding with today's appointment, [name]'s physical location at the time of this appointment was obtained as well a phone number he could be reached at in the event of technical difficulties. [name] and this provider participated in today's telepsychological service.   This provider conducted a brief check-in. [name] noted he continues to have challenges with satiation. Further explored and processed. Psychoeducation regarding triggers for emotional eating was provided. [name] was provided a handout, and encouraged to utilize the handout between now and the next appointment to increase awareness of triggers and frequency. [name] agreed. This provider also discussed behavioral strategies for specific triggers, such as placing the utensil down when conversing to avoid mindless eating. [name] provided verbal consent during today's appointment for this provider to send a handout about triggers via e-mail. Additionally, psychoeducation provided regarding the importance of eating regularly. He agreed to set reminders on his phone to assist with the aforementioned. Overall, [name] was receptive to today's appointment as evidenced by openness to sharing,  responsiveness to feedback, and willingness to explore triggers for emotional eating.  Mental Status Examination:  Appearance: neat Behavior: appropriate to circumstances Mood: neutral Affect: mood congruent Speech: WNL Eye Contact: appropriate Psychomotor Activity: WNL Gait: unable to assess Thought Process: linear, logical, and goal directed and no evidence or endorsement of suicidal, homicidal, and self-harm ideation, plan and intent  Thought Content/Perception: no hallucinations, delusions, bizarre thinking or behavior endorsed or observed Orientation: AAOx4 Memory/Concentration: memory, attention, language, and fund of knowledge intact  Insight: fair Judgment: fair  Interventions:  Conducted a brief chart review Provided empathic reflections and validation Employed supportive psychotherapy interventions to facilitate reduced distress and to improve coping skills with identified stressors Psychoeducation provided regarding triggers for emotional eating behaviors  DSM-5 Diagnosis(es): F50.89 Other Specified Feeding or Eating Disorder, Emotional Eating Behaviors  Treatment Goal & Progress: During the initial appointment with this provider, the following treatment goal was established: increase coping skills. Progress is limited, as [name] has just begun treatment with this provider; however, he is receptive to the interaction and interventions and rapport is being established.   Plan: The next appointment is scheduled for 03/19/2023 at 2:30pm, which will be via MyChart Video Visit. The next session will focus on working towards the established treatment goal. [name] will reschedule his appointment with Dr. Rikki Spearing.

## 2023-02-22 NOTE — Telephone Encounter (Signed)
error 

## 2023-02-24 ENCOUNTER — Encounter: Payer: Self-pay | Admitting: Cardiology

## 2023-03-05 ENCOUNTER — Other Ambulatory Visit (HOSPITAL_COMMUNITY): Payer: Self-pay

## 2023-03-05 ENCOUNTER — Other Ambulatory Visit: Payer: Self-pay

## 2023-03-05 ENCOUNTER — Telehealth: Payer: Self-pay | Admitting: Pharmacy Technician

## 2023-03-05 DIAGNOSIS — E66812 Obesity, class 2: Secondary | ICD-10-CM

## 2023-03-05 DIAGNOSIS — I5022 Chronic systolic (congestive) heart failure: Secondary | ICD-10-CM

## 2023-03-05 NOTE — Telephone Encounter (Signed)
Pharmacy Patient Advocate Encounter  Received notification from [employer] that Prior Authorization for zepbound has been DENIED.  See denial reason below. No denial letter attached in CMM. Will attache denial letter to Media tab once received.   PA #/Case ID/Reference #: [id number]

## 2023-03-05 NOTE — Telephone Encounter (Signed)
Pharmacy Patient Advocate Encounter   Received notification from Pt Calls Messages that prior authorization for zepbound is required/requested.   Insurance verification completed.   The patient is insured through [employer] .   Per test claim: PA required; PA submitted to [employer] via CoverMyMeds Key/confirmation #/EOC [id number] Status is pending

## 2023-03-05 NOTE — Telephone Encounter (Signed)
[facility] pharmacy is requesting a prior auth for medication Zepbound. Please address

## 2023-03-06 NOTE — Telephone Encounter (Signed)
Called his pharmacy, his insurance doesn't cover Zepbound, he's been paying ~$1,000 using just the discount card. He last picked up on 8/20. Advised them to process rx the same way they have been.

## 2023-03-06 NOTE — Telephone Encounter (Signed)
Pt's insurance doesn't cover weight loss meds. Called his pharmacy, he last picked up Zepbound on 8/20 and paid around $1,000 for med using the cash card. Advised them to process rx the same way they have been.

## 2023-03-06 NOTE — Progress Notes (Signed)
Seen by casting department

## 2023-03-19 ENCOUNTER — Telehealth (INDEPENDENT_AMBULATORY_CARE_PROVIDER_SITE_OTHER): Payer: Self-pay | Admitting: Psychology

## 2023-03-19 ENCOUNTER — Encounter (INDEPENDENT_AMBULATORY_CARE_PROVIDER_SITE_OTHER): Payer: Medicare HMO | Admitting: Psychology

## 2023-03-19 ENCOUNTER — Encounter (INDEPENDENT_AMBULATORY_CARE_PROVIDER_SITE_OTHER): Payer: Self-pay

## 2023-03-19 NOTE — Telephone Encounter (Signed)
  Office: [phone]  /  Fax: [phone]  Date of Encounter: March 19, 2023  Time of Encounter: 2:30pm Duration of Encounter: ~5.5 minute(s) Provider: Lawerance Cruel, PsyD  CONTENT: Marcy Salvo presented for today's appointment via MyChart Video Visit, but expressed concern regarding his insurance as he recently switched to Medicare. He explained he previously contacted [facility] but did not hear back. Given the uncertainty regarding coverage, he was receptive to canceling today's appointment and was receptive to this provider following-up with Creed Copper (Patient Access Advocate Lead) for further information. No evidence or endorsement of safety concerns. All questions/concerns addressed.   PLAN: This provider spoke with Mr. Roxan Hockey immediately following contact with Marcy Salvo. Mr. Roxan Hockey stated he would follow-up with Marcy Salvo to obtain further information and if appropriate, Benoit will be rescheduled with this provider and Dr. Rikki Spearing for follow-up appointments.

## 2023-03-19 NOTE — Progress Notes (Signed)
Entered in error

## 2023-03-20 ENCOUNTER — Telehealth (INDEPENDENT_AMBULATORY_CARE_PROVIDER_SITE_OTHER): Payer: Self-pay | Admitting: Psychology

## 2023-03-27 ENCOUNTER — Encounter (INDEPENDENT_AMBULATORY_CARE_PROVIDER_SITE_OTHER): Payer: Self-pay | Admitting: Internal Medicine

## 2023-03-27 ENCOUNTER — Ambulatory Visit (INDEPENDENT_AMBULATORY_CARE_PROVIDER_SITE_OTHER): Payer: Medicare HMO | Admitting: Internal Medicine

## 2023-03-27 VITALS — BP 128/77 | HR 93 | Temp 98.3°F | Ht 66.0 in | Wt 225.0 lb

## 2023-03-27 DIAGNOSIS — E66812 Obesity, class 2: Secondary | ICD-10-CM | POA: Diagnosis not present

## 2023-03-27 DIAGNOSIS — G4733 Obstructive sleep apnea (adult) (pediatric): Secondary | ICD-10-CM | POA: Diagnosis not present

## 2023-03-27 DIAGNOSIS — E88819 Insulin resistance, unspecified: Secondary | ICD-10-CM

## 2023-03-27 DIAGNOSIS — I428 Other cardiomyopathies: Secondary | ICD-10-CM | POA: Diagnosis not present

## 2023-03-27 DIAGNOSIS — R748 Abnormal levels of other serum enzymes: Secondary | ICD-10-CM | POA: Diagnosis not present

## 2023-03-27 DIAGNOSIS — Z6838 Body mass index (BMI) 38.0-38.9, adult: Secondary | ICD-10-CM | POA: Diagnosis not present

## 2023-03-27 MED ORDER — SEMAGLUTIDE-WEIGHT MANAGEMENT 1 MG/0.5ML ~~LOC~~ SOAJ
1.0000 mg | SUBCUTANEOUS | 0 refills | Status: AC
Start: 2023-05-24 — End: 2023-06-21

## 2023-03-27 NOTE — Assessment & Plan Note (Signed)
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 Optimal level < 1.9. This is complex condition associated with genetics, ectopic fat and lifestyle factors. Insulin resistance may result in weight gain, abnormal cravings (particularly for carbs) and fatigue. This may result in additional weight gain and lead to pre-diabetes and diabetes if untreated.   Lab Results  Component Value Date   HGBA1C 5.4 09/13/2022   Lab Results  Component Value Date   INSULIN 34.8 (H) 09/13/2022   Lab Results  Component Value Date   GLUCOSE 100 (H) 09/13/2022   GLUCOSE 96 03/09/2008    We reviewed treatment options which include losing 7 to 10% of body weight, increasing physical activity to a 150 minutes a week of moderate intensity.  Patient would also benefit from pharmacoprophylaxis with GLP-1 medication.

## 2023-03-27 NOTE — Assessment & Plan Note (Signed)
Severe currently untreated patient has intolerance to mask.  Counseled on the risk associated with untreated sleep apnea.  Losing 15% of body weight may improve condition.  Patient benefits from incretin therapy

## 2023-03-27 NOTE — Assessment & Plan Note (Signed)
Currently on carvedilol, Entresto and spironolactone.  He is also on CRT.  There are no signs or symptoms of volume overload.   Losing 15% of body weight may improve cardiac status.  GLP-1 therapy may also reduce the risks of exacerbations of heart failure.  Continue guideline based medical therapy and follow-up with cardiology

## 2023-03-27 NOTE — Progress Notes (Addendum)
Office: [phone]  /  Fax: [phone]  WEIGHT SUMMARY AND BIOMETRICS  Vitals Temp: 98.3 F (36.8 C) BP: 128/77 Pulse Rate: 93   Anthropometric Measurements Height: 5\' 6"  (1.676 m) Weight: 225 lb (102.1 kg) BMI (Calculated): 36.33 Weight at Last Visit: 224 lb Weight Lost Since Last Visit: 0 lb Weight Gained Since Last Visit: 1 lb Starting Weight: 235 lb Total Weight Loss (lbs): 10 lb (4.536 kg) Peak Weight: 245 lb   Body Composition  Body Fat %: 31.6 % Fat Mass (lbs): 71.2 lbs Muscle Mass (lbs): 146.2 lbs Total Body Water (lbs): 109.4 lbs Visceral Fat Rating : 20    No data recorded Today's Visit #: 5  Starting Date: 09/13/22   HPI  Chief Complaint: OBESITY  [name] is here to discuss his progress with his obesity treatment plan. He is on the low car plan and states he is following his eating plan approximately 80 % of the time. He states he is not exercising.  Interval History:   Discussed the use of AI scribe software for clinical note transcription with the patient, who gave verbal consent to proceed.  History of Present Illness    The patient, known with obesity, presents for follow-up after a period of absence. He reports a recent back injury which has limited his physical activity. He has been off his weight management medication for approximately two weeks due to insurance coverage issues after switching to Medicare. He reports a significant increase in hunger and cravings, particularly for chocolate, since discontinuing the medication.  Previously, the patient was on a maximum dose of Zepbound 15 mg once a week, which he found very effective in controlling his hunger. However, due to the high cost and lack of coverage under his new insurance, he had to discontinue it. He attempted to use a discount card for the medication, but this was not accepted due to his Medicare status.  He expresses frustration with the high cost of his insurance, which he  reports is around seven hundred dollars a month.  In terms of his weight management, the patient has been trying to control his hunger by drinking water and eating protein-rich foods like chicken and fish. However, he still struggles with strong hunger signals and cravings. He expresses a desire to return to using weight management medication to help control these symptoms.  The patient also has a history of sleep apnea and elevated liver enzymes, which may be contributing to his obesity. He has a pacemaker for NICM. He expresses hope that his heart condition might make him eligible for certain weight management medications, but this is uncertain.  Overall, the patient is dealing with significant challenges related to his obesity, including strong hunger signals, cravings, and difficulties with insurance coverage for his weight management medication. He is seeking solutions to help manage his weight and control his hunger.   He has seen Dr. Dewaine Conger for CBT but has not been able to follow-up due to transitions in insurance       Orexigenic Control: Reports problems with appetite and hunger signals.  Reports problems with satiety and satiation.  Reports problems with eating patterns and portion control.  Reports abnormal cravings. Denies feeling deprived or restricted.   Barriers identified: cost of medication, strong hunger signals and impaired satiety / inhibitory control, orthopedic problems, medical conditions or chronic pain affecting mobility, and difficulty maintaining a reduced calorie state.   Pharmacotherapy for weight loss: He is currently taking no anti-obesity medication and had  been on Zepbound 15 mg for 2 to 3 months, previously had been on compounded tirzepatide and before that on Wegovy.  He is encounter frequent drug interruptions due to insurance coverage and changes in qualification .    ASSESSMENT AND PLAN  TREATMENT PLAN FOR OBESITY:  Recommended Dietary  Goals  [name] is currently in the action stage of change. As such, his goal is to continue weight management plan. He has agreed to: portion control, balanced plate and making smarter food choices, such as increasing vegetables, protein intake and reducing simple carbohydrates and processed foods   Behavioral Intervention  We discussed the following Behavioral Modification Strategies today: continue to work on maintaining a reduced calorie state, getting the recommended amount of protein, incorporating whole foods, making healthy choices, staying well hydrated and practicing mindfulness when eating..  Additional resources provided today: None  Recommended Physical Activity Goals  [name] has been advised to work up to 150 minutes of moderate intensity aerobic activity a week and strengthening exercises 2-3 times per week for cardiovascular health, weight loss maintenance and preservation of muscle mass.   He has agreed to :  Recommend working with a Marketing executive to work on conditioning and avoid further injuries  Pharmacotherapy We discussed various medication options to help [name] with his weight loss efforts and we both agreed to : start anti-obesity medication.  In addition to reduced calorie nutrition plan (RCNP), behavioral strategies and physical activity, [name] would benefit from pharmacotherapy to assist with hunger signals, satiety and cravings. This will reduce obesity-related health risks by inducing weight loss, and help reduce food consumption and adherence to [name]) . It may also improve QOL by improving self-confidence and reduce the  setbacks associated with metabolic adaptations.  He also has elevated liver enzymes, untreated OSA, hyperinsulinemia, dilated cardiomyopathy with pacemaker and therefore at risk for future cardiac events.  Losing weight may reduce the risk of complications.  After discussion of treatment options, mechanisms of action, benefits, side  effects, contraindications and shared decision making he is agreeable to starting Wegovy at 1 mg once a week since he was at 15 mg of Zepbound 2 weeks ago so tolerance will not be an issue. Patient also made aware that medication is indicated for long-term management of obesity and the risk of weight regain following discontinuation of treatment and hence the importance of adhering to medical weight loss plan.  We demonstrated use of device and patient using teach back method was able to demonstrate proper technique.  ASSOCIATED CONDITIONS ADDRESSED TODAY  Class 2 severe obesity with serious comorbidity and body mass index (BMI) of 38.0 to 38.9 in adult, unspecified obesity type (HCC) -     Semaglutide-Weight Management; Inject 1 mg into the skin once a week for 28 days.  Dispense: 2 mL; Refill: 0  Obstructive sleep apnea Assessment & Plan: Severe currently untreated patient has intolerance to mask.  Counseled on the risk associated with untreated sleep apnea.  Losing 15% of body weight may improve condition.  Patient benefits from incretin therapy  Orders: -     Semaglutide-Weight Management; Inject 1 mg into the skin once a week for 28 days.  Dispense: 2 mL; Refill: 0  NICM (nonischemic cardiomyopathy) (HCC) Assessment & Plan: Currently on carvedilol, Entresto and spironolactone.  He is also on CRT.  There are no signs or symptoms of volume overload.   Losing 15% of body weight may improve cardiac status.  GLP-1 therapy may also reduce the risks of exacerbations  of heart failure.  Continue guideline based medical therapy and follow-up with cardiology  Orders: -     Semaglutide-Weight Management; Inject 1 mg into the skin once a week for 28 days.  Dispense: 2 mL; Refill: 0  Insulin resistance Assessment & Plan: His HOMA-IR is 8.39 which is markedly elevated. Optimal level < 1.9. This is complex condition associated with genetics, ectopic fat and lifestyle factors. Insulin resistance may  result in weight gain, abnormal cravings (particularly for carbs) and fatigue. This may result in additional weight gain and lead to pre-diabetes and diabetes if untreated.   Lab Results  Component Value Date   HGBA1C 5.4 09/13/2022   Lab Results  Component Value Date   INSULIN 34.8 (H) 09/13/2022   Lab Results  Component Value Date   GLUCOSE 100 (H) 09/13/2022   GLUCOSE 96 03/09/2008    We reviewed treatment options which include losing 7 to 10% of body weight, increasing physical activity to a 150 minutes a week of moderate intensity.  Patient would also benefit from pharmacoprophylaxis with GLP-1 medication.  Orders: -     Semaglutide-Weight Management; Inject 1 mg into the skin once a week for 28 days.  Dispense: 2 mL; Refill: 0  Elevated liver enzymes Assessment & Plan: His ALT was 51 with normal AST, protein and bilirubin.  He had a CT scan last year which did not show any hepatic steatosis but he may still have fatty liver disease.  He denies drinking alcohol or risk factors for viral hepatitis.  He denies taking supplements.  He would benefit from initial evaluation and this will be completed at the next office visit.  We will check hepatitis serologies as well as serum iron studies.  We may also follow-up with a liver ultrasound.  Orders: -     Semaglutide-Weight Management; Inject 1 mg into the skin once a week for 28 days.  Dispense: 2 mL; Refill: 0    PHYSICAL EXAM:  Blood pressure 128/77, pulse 93, temperature 98.3 F (36.8 C), height 5\' 6"  (1.676 m), weight 225 lb (102.1 kg). Body mass index is 36.32 kg/m.  General: He is overweight, cooperative, alert, well developed, and in no acute distress. PSYCH: Has normal mood, affect and thought process.   HEENT: EOMI, sclerae are anicteric. Lungs: Normal breathing effort, no conversational dyspnea. Extremities: No edema.  Neurologic: No gross sensory or motor deficits. No tremors or fasciculations noted.     DIAGNOSTIC DATA REVIEWED:  BMET    Component Value Date/Time   NA 140 09/13/2022 0824   K 4.4 09/13/2022 0824   CL 103 09/13/2022 0824   CO2 21 09/13/2022 0824   GLUCOSE 100 (H) 09/13/2022 0824   GLUCOSE 98 06/02/2022 0109   BUN 17 09/13/2022 0824   CREATININE 0.82 09/13/2022 0824   CALCIUM 9.2 09/13/2022 0824   GFRNONAA >60 06/02/2022 0109   GFRAA >60 03/04/2015 0520   Lab Results  Component Value Date   HGBA1C 5.4 09/13/2022   Lab Results  Component Value Date   INSULIN 34.8 (H) 09/13/2022   Lab Results  Component Value Date   TSH 1.780 09/13/2022   CBC    Component Value Date/Time   WBC 8.3 09/13/2022 0824   WBC 9.1 06/02/2022 0109   RBC 5.85 (H) 09/13/2022 0824   RBC 5.07 06/02/2022 0109   HGB 17.1 09/13/2022 0824   HCT 51.4 (H) 09/13/2022 0824   PLT 196 09/13/2022 0824   MCV 88 09/13/2022 0824   MCH  29.2 09/13/2022 0824   MCH 29.4 06/02/2022 0109   MCHC 33.3 09/13/2022 0824   MCHC 33.7 06/02/2022 0109   RDW 13.7 09/13/2022 0824   Iron Studies No results found for: "IRON", "TIBC", "FERRITIN", "IRONPCTSAT" Lipid Panel     Component Value Date/Time   CHOL 158 09/13/2022 0824   TRIG 60 09/13/2022 0824   HDL 58 09/13/2022 0824   CHOLHDL 5 11/04/2008 0829   VLDL 13.2 11/04/2008 0829   LDLCALC 88 09/13/2022 0824   LDLDIRECT 169.1 11/04/2008 0829   Hepatic Function Panel     Component Value Date/Time   PROT 7.1 09/13/2022 0824   ALBUMIN 4.4 09/13/2022 0824   AST 37 09/13/2022 0824   ALT 51 (H) 09/13/2022 0824   ALKPHOS 66 09/13/2022 0824   BILITOT 0.5 09/13/2022 0824   BILIDIR 0.0 11/04/2008 0829      Component Value Date/Time   TSH 1.780 09/13/2022 0824   Nutritional Lab Results  Component Value Date   VD25OH 30.2 09/13/2022   VD25OH 26 (L) 11/04/2008     Return in about 4 weeks (around 04/24/2023) for For Weight Mangement with Dr. Rikki Spearing.Marland Kitchen He was informed of the importance of frequent follow up visits to maximize his success with  intensive lifestyle modifications for his multiple health conditions.   ATTESTASTION STATEMENTS:  Reviewed by clinician on day of visit: allergies, medications, problem list, medical history, surgical history, family history, social history, and previous encounter notes.     Worthy Rancher, MD

## 2023-03-27 NOTE — Assessment & Plan Note (Signed)
His ALT was 51 with normal AST, protein and bilirubin.  He had a CT scan last year which did not show any hepatic steatosis but he may still have fatty liver disease.  He denies drinking alcohol or risk factors for viral hepatitis.  He denies taking supplements.  He would benefit from initial evaluation and this will be completed at the next office visit.  We will check hepatitis serologies as well as serum iron studies.  We may also follow-up with a liver ultrasound.

## 2023-04-11 ENCOUNTER — Telehealth (INDEPENDENT_AMBULATORY_CARE_PROVIDER_SITE_OTHER): Payer: Self-pay | Admitting: Internal Medicine

## 2023-04-11 NOTE — Telephone Encounter (Signed)
Pt called stating that he needs to know if Dr. Rikki Spearing was able to find a replacement for [facility] as pt is out of the medication. Pt states that his new Medicare insurance will not cover Wegovy.

## 2023-04-12 ENCOUNTER — Encounter (INDEPENDENT_AMBULATORY_CARE_PROVIDER_SITE_OTHER): Payer: Self-pay

## 2023-04-12 ENCOUNTER — Telehealth (INDEPENDENT_AMBULATORY_CARE_PROVIDER_SITE_OTHER): Payer: Self-pay

## 2023-04-12 NOTE — Telephone Encounter (Signed)
Spoke to pharmacist, pt has Part D MCR and needs a PA. Will complete PA

## 2023-05-01 ENCOUNTER — Ambulatory Visit (INDEPENDENT_AMBULATORY_CARE_PROVIDER_SITE_OTHER): Payer: Medicare HMO | Admitting: Internal Medicine

## 2023-05-28 ENCOUNTER — Other Ambulatory Visit: Payer: Self-pay

## 2023-05-28 NOTE — Telephone Encounter (Signed)
Pt's pharmacy is requesting a refill on entresto. Would Dr. Nelly Laurence like to refill this medication? Please address

## 2023-05-29 MED ORDER — ENTRESTO 97-103 MG PO TABS: 1.0000 | ORAL_TABLET | Freq: Two times a day (BID) | ORAL | 3 refills | Status: DC

## 2023-06-07 ENCOUNTER — Encounter: Payer: Self-pay | Admitting: Cardiology

## 2023-06-07 ENCOUNTER — Ambulatory Visit: Payer: HMO | Attending: Cardiology | Admitting: Cardiology

## 2023-06-07 VITALS — BP 122/72 | HR 81 | Ht 66.0 in | Wt 233.2 lb

## 2023-06-07 DIAGNOSIS — E66812 Obesity, class 2: Secondary | ICD-10-CM

## 2023-06-07 DIAGNOSIS — I447 Left bundle-branch block, unspecified: Secondary | ICD-10-CM | POA: Diagnosis not present

## 2023-06-07 DIAGNOSIS — I5022 Chronic systolic (congestive) heart failure: Secondary | ICD-10-CM

## 2023-06-07 DIAGNOSIS — I420 Dilated cardiomyopathy: Secondary | ICD-10-CM

## 2023-06-07 DIAGNOSIS — Z6837 Body mass index (BMI) 37.0-37.9, adult: Secondary | ICD-10-CM

## 2023-06-07 DIAGNOSIS — E7800 Pure hypercholesterolemia, unspecified: Secondary | ICD-10-CM

## 2023-06-07 MED ORDER — NALTREXONE HCL 50 MG PO TABS: 50.0000 mg | ORAL_TABLET | Freq: Every day | ORAL | 1 refills | Status: AC

## 2023-06-07 MED ORDER — SPIRONOLACTONE 25 MG PO TABS: 25.0000 mg | ORAL_TABLET | Freq: Every day | ORAL | 3 refills | Status: DC

## 2023-06-07 MED ORDER — ROSUVASTATIN CALCIUM 20 MG PO TABS
20.0000 mg | ORAL_TABLET | Freq: Every day | ORAL | 3 refills | Status: AC
Start: 2023-06-07 — End: ?

## 2023-06-07 MED ORDER — CARVEDILOL 25 MG PO TABS: 25.0000 mg | ORAL_TABLET | Freq: Two times a day (BID) | ORAL | 3 refills | Status: AC

## 2023-06-07 NOTE — Patient Instructions (Signed)
 Medication Instructions:  Your physician recommends that you continue on your current medications as directed. Please refer to the Current Medication list given to you today.  *If you need a refill on your cardiac medications before your next appointment, please call your pharmacy*   Lab Work: none If you have labs (blood work) drawn today and your tests are completely normal, you will receive your results only by: MyChart Message (if you have MyChart) OR A paper copy in the mail If you have any lab test that is abnormal or we need to change your treatment, we will call you to review the results.   Testing/Procedures: Your physician has requested that you have an echocardiogram. Echocardiography is a painless test that uses sound waves to create images of your heart. It provides your doctor with information about the size and shape of your heart and how well your heart's chambers and valves are working. This procedure takes approximately one hour. There are no restrictions for this procedure. Please do NOT wear cologne, perfume, aftershave, or lotions (deodorant is allowed). Please arrive 15 minutes prior to your appointment time.  Please note: We ask at that you not bring children with you during ultrasound (echo/ vascular) testing. Due to room size and safety concerns, children are not allowed in the ultrasound rooms during exams. Our front office staff cannot provide observation of children in our lobby area while testing is being conducted. An adult accompanying a patient to their appointment will only be allowed in the ultrasound room at the discretion of the ultrasound technician under special circumstances. We apologize for any inconvenience.    Follow-Up: At [facility], you and your health needs are our priority.  As part of our continuing mission to provide you with exceptional heart care, we have created designated Provider Care Teams.  These Care Teams include your  primary Cardiologist (physician) and Advanced Practice Providers (APPs -  Physician Assistants and Nurse Practitioners) who all work together to provide you with the care you need, when you need it.  We recommend signing up for the patient portal called "MyChart".  Sign up information is provided on this After Visit Summary.  MyChart is used to connect with patients for Virtual Visits (Telemedicine).  Patients are able to view lab/test results, encounter notes, upcoming appointments, etc.  Non-urgent messages can be sent to your provider as well.   To learn more about what you can do with MyChart, go to [email or web address].    Your next appointment:   12 month(s)  Provider:   Dr Jacinto Halim    Other Instructions

## 2023-06-07 NOTE — Progress Notes (Signed)
 Cardiology Office Note:  .   Date:  06/07/2023  ID:  MANG [name], DOB [birth year], MRN [id number] PCP: Valma Carwin, MD  [facility] Providers Cardiologist:  Gordy Bergamo, MD Electrophysiologist:  Eulas FORBES Furbish, MD   History of Present Illness: .   [name] is a 68 y.o.  male with history of hypercholesteremia, hypertension, OSA noncompliant with CPAP, and gout. 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  He had 1 episode of syncope and outpatient extended EKG monitoring on 03/27/2022 had revealed episodes of NSVT.  Cardiac catheterization on 05/05/2022 revealing normal coronary arteries.  He underwent CRT-D placement in December 2023. I had not seen him for >1-year, now presents for routine follow-up.  Continues to endorse issues with weight loss.  He has been trying hard to get GLP-1 agonist approved by his insurance for weight loss but has not been successful.  He has gained weight since last office visit.  He denies chest pain or shortness of breath but does admit to not being active.  No PND orthopnea or chest pain or palpitations, no dizziness or syncope.  Remains asymptomatic.  Labs   Lab Results  Component Value Date   CHOL 158 09/13/2022   HDL 58 09/13/2022   LDLCALC 88 09/13/2022   LDLDIRECT 169.1 11/04/2008   TRIG 60 09/13/2022   CHOLHDL 5 11/04/2008   Lab Results  Component Value Date   NA 140 09/13/2022   K 4.4 09/13/2022   CO2 21 09/13/2022   GLUCOSE 100 (H) 09/13/2022   BUN 17 09/13/2022   CREATININE 0.82 09/13/2022   CALCIUM  9.2 09/13/2022   EGFR 98 09/13/2022   GFRNONAA >60 06/02/2022      Latest Ref Rng & Units 09/13/2022    8:24 AM 06/02/2022    1:09 AM 06/01/2022   11:44 AM  BMP  Glucose 70 - 99 mg/dL 899  98  89   BUN 8 - 27 mg/dL 17  12  10    Creatinine 0.76 - 1.27 mg/dL 9.17  9.16  9.08   BUN/Creat Ratio 10 - 24 21     Sodium 134 - 144 mmol/L 140  140  136   Potassium 3.5 - 5.2  mmol/L 4.4  3.8  4.6   Chloride 96 - 106 mmol/L 103  103  105   CO2 20 - 29 mmol/L 21  25  24    Calcium  8.6 - 10.2 mg/dL 9.2  9.1  9.1       Latest Ref Rng & Units 09/13/2022    8:24 AM 06/02/2022    1:09 AM 06/01/2022   11:44 AM  CBC  WBC 3.4 - 10.8 x10E3/uL 8.3  9.1  8.3   Hemoglobin 13.0 - 17.7 g/dL 82.8  85.0  84.6   Hematocrit 37.5 - 51.0 % 51.4  44.2  45.5   Platelets 150 - 450 x10E3/uL 196  156  184    Lab Results  Component Value Date   HGBA1C 5.4 09/13/2022   Lab Results  Component Value Date   TSH 1.780 09/13/2022    Review of Systems  Cardiovascular:  Negative for chest pain, dyspnea on exertion and leg swelling.    Physical Exam:   VS:  BP 122/72   Pulse 81   Ht 5' 6 (1.676 m)   Wt 233 lb 3.2 oz (105.8 kg)   SpO2 95%   BMI 37.64 kg/m    Wt  Readings from Last 3 Encounters:  06/07/23 233 lb 3.2 oz (105.8 kg)  03/27/23 225 lb (102.1 kg)  11/21/22 224 lb (101.6 kg)     Physical Exam Constitutional:      Appearance: He is obese.  Neck:     Vascular: No carotid bruit or JVD.  Cardiovascular:     Rate and Rhythm: Normal rate and regular rhythm.     Pulses: Intact distal pulses.     Heart sounds: Normal heart sounds. No murmur heard.    No gallop.  Pulmonary:     Effort: Pulmonary effort is normal.     Breath sounds: Normal breath sounds.  Abdominal:     General: Bowel sounds are normal.     Palpations: Abdomen is soft.  Musculoskeletal:     Right lower leg: No edema.     Left lower leg: No edema.     Studies Reviewed: SABRA    ECHOCARDIOGRAM COMPLETE 05/12/2022 Moderately depressed LV systolic function with visual EF 30-35%. Left ventricle cavity is normal in size. Mild concentric remodeling of the left ventricle. Hypokinetic global wall motion. Abnormal septal wall motion due to left bundle branch block. Normal diastolic filling pattern. Trileaflet aortic valve.  Mild (Grade I) aortic regurgitation. Pericardium is normal. Trace pericardial  effusion. Compared to the study done on 06/28/2021, LVEF marginally better at 30 to 35% compared to 25 to 30%.  Otherwise no significant change.  EKG:    EKG Interpretation Date/Time:  Thursday June 07 2023 09:40:58 EST Ventricular Rate:  81 PR Interval:  190 QRS Duration:  118 QT Interval:  380 QTC Calculation: 441 R Axis:   -34  Text Interpretation: EKG 06/07/2023: Atrially sensed, biventricular paced rhythm.  Ventricular rate 81 bpm.  No significant change compared to 06/02/2022. Confirmed by Yaritsa Savarino, Jagadeesh ([id number]) on 06/07/2023 9:50:58 AM    Medications and allergies    No Known Allergies   Current Outpatient Medications:    allopurinol (ZYLOPRIM) 100 MG tablet, Take 100 mg by mouth daily., Disp: , Rfl:    ENTRESTO  97-103 MG, Take 1 tablet by mouth 2 (two) times daily., Disp: 180 tablet, Rfl: 3   methocarbamol  (ROBAXIN ) 500 MG tablet, Take 500 mg by mouth every 8 (eight) hours as needed for muscle spasms., Disp: , Rfl:    Multiple Vitamins-Minerals (MULTIVITAMIN WITH MINERALS) tablet, Take 1 tablet by mouth daily., Disp: , Rfl:    Multiple Vitamins-Minerals (ZINC PO), Take 1 tablet by mouth daily., Disp: , Rfl:    naltrexone  (DEPADE) 50 MG tablet, Take 1 tablet (50 mg total) by mouth daily. Start 0.5 (25 mg) tab daily for 1 week., Disp: 30 tablet, Rfl: 1   Semaglutide -Weight Management 1 MG/0.5ML SOAJ, Inject 1 mg into the skin once a week for 28 days., Disp: 2 mL, Rfl: 0   tadalafil  (CIALIS ) 5 MG tablet, Take 5 mg by mouth at bedtime., Disp: , Rfl:    tamsulosin  (FLOMAX ) 0.4 MG CAPS capsule, Take 0.4 mg by mouth at bedtime., Disp: , Rfl:    testosterone cypionate (DEPOTESTOSTERONE CYPIONATE) 200 MG/ML injection, Inject 200 mg into the muscle once a week., Disp: , Rfl:    topiramate  (TOPAMAX ) 25 MG tablet, Take 1 tablet (25 mg total) by mouth daily., Disp: 30 tablet, Rfl: 0   carvedilol  (COREG ) 25 MG tablet, Take 1 tablet (25 mg total) by mouth 2 (two) times daily., Disp: 180  tablet, Rfl: 3   rosuvastatin  (CRESTOR ) 20 MG tablet, Take 1 tablet (20 mg total)  by mouth daily., Disp: 90 tablet, Rfl: 3   spironolactone  (ALDACTONE ) 25 MG tablet, Take 1 tablet (25 mg total) by mouth daily., Disp: 90 tablet, Rfl: 3   ASSESSMENT AND PLAN: .      ICD-10-CM   1. Chronic systolic congestive heart failure (HCC)  I50.22 EKG 12-Lead    carvedilol  (COREG ) 25 MG tablet    spironolactone  (ALDACTONE ) 25 MG tablet    ECHOCARDIOGRAM COMPLETE    2. Dilated cardiomyopathy (HCC)  I42.0 EKG 12-Lead    carvedilol  (COREG ) 25 MG tablet    spironolactone  (ALDACTONE ) 25 MG tablet    ECHOCARDIOGRAM COMPLETE    3. LBBB (left bundle branch block)  I44.7 EKG 12-Lead    ECHOCARDIOGRAM COMPLETE    4. Class 2 severe obesity due to excess calories with serious comorbidity and body mass index (BMI) of 37.0 to 37.9 in adult [facility])  Z33.187 naltrexone  (DEPADE) 50 MG tablet   E66.01    Z68.37     5. Pure hypercholesterolemia  E78.00 rosuvastatin  (CRESTOR ) 20 MG tablet     1. Chronic systolic congestive heart failure (HCC) (Primary) Patient is well compensated and is presently on guideline directed medical therapy including maximum dose of carvedilol  and Entresto  with excellent control of blood pressure as well along with spironolactone .  Continue the same, no changes in the medications were done today.  Will obtain echocardiogram - EKG 12-Lead - carvedilol  (COREG ) 25 MG tablet; Take 1 tablet (25 mg total) by mouth 2 (two) times daily.  Dispense: 180 tablet; Refill: 3 - spironolactone  (ALDACTONE ) 25 MG tablet; Take 1 tablet (25 mg total) by mouth daily.  Dispense: 90 tablet; Refill: 3 - ECHOCARDIOGRAM COMPLETE; Future  2. Dilated cardiomyopathy (HCC) Has underlying left bundle branch block and Deller cardiomyopathy, hopefully now that he has had BiV ICD and resynchronization therapy, EF could have improved.  Will obtain echocardiogram. - EKG 12-Lead - carvedilol  (COREG ) 25 MG tablet; Take 1  tablet (25 mg total) by mouth 2 (two) times daily.  Dispense: 180 tablet; Refill: 3 - spironolactone  (ALDACTONE ) 25 MG tablet; Take 1 tablet (25 mg total) by mouth daily.  Dispense: 90 tablet; Refill: 3 - ECHOCARDIOGRAM COMPLETE; Future  3. LBBB (left bundle branch block)  - EKG 12-Lead - ECHOCARDIOGRAM COMPLETE; Future  4. Class 2 severe obesity due to excess calories with serious comorbidity and body mass index (BMI) of 37.0 to 37.9 in adult [facility]) I would like to try naltrexone  50 mg, patient will start with 25 mg for 1 week and increase to 50 mg daily.  1 refill given.  Patient will contact me to let me know how he is doing and if it is effective.  Discussed the side effects of the medications. - naltrexone  (DEPADE) 50 MG tablet; Take 1 tablet (50 mg total) by mouth daily. Start 0.5 (25 mg) tab daily for 1 week.  Dispense: 30 tablet; Refill: 1  5. Pure hypercholesterolemia Lipids are well-controlled on Crestor .  As he does not have any significant coronary disease no indication for aspirin .  He remained stable otherwise from cardiac standpoint and I will see him back in a year or sooner if problems. - rosuvastatin  (CRESTOR ) 20 MG tablet; Take 1 tablet (20 mg total) by mouth daily.  Dispense: 90 tablet; Refill: 3  Other orders - methocarbamol  (ROBAXIN ) 500 MG tablet; Take 500 mg by mouth every 8 (eight) hours as needed for muscle spasms.   Signed,  Gordy Bergamo, MD, [facility] 06/07/2023, 6:20 PM [facility] [address] [place], [place] [address] Phone: [phone]. Fax:  [phone]

## 2023-07-05 ENCOUNTER — Ambulatory Visit (HOSPITAL_COMMUNITY): Payer: PPO | Attending: Cardiology

## 2023-07-05 ENCOUNTER — Encounter: Payer: Self-pay | Admitting: Cardiology

## 2023-07-05 DIAGNOSIS — I420 Dilated cardiomyopathy: Secondary | ICD-10-CM | POA: Insufficient documentation

## 2023-07-05 DIAGNOSIS — I5022 Chronic systolic (congestive) heart failure: Secondary | ICD-10-CM | POA: Insufficient documentation

## 2023-07-05 DIAGNOSIS — I447 Left bundle-branch block, unspecified: Secondary | ICD-10-CM | POA: Diagnosis present

## 2023-07-05 LAB — ECHOCARDIOGRAM COMPLETE
Area-P 1/2: 2.5 cm2
P 1/2 time: 630 ms
S' Lateral: 3.6 cm

## 2023-08-09 ENCOUNTER — Other Ambulatory Visit: Payer: Self-pay | Admitting: Cardiology

## 2023-08-09 DIAGNOSIS — I5022 Chronic systolic (congestive) heart failure: Secondary | ICD-10-CM

## 2023-08-09 DIAGNOSIS — Z6837 Body mass index (BMI) 37.0-37.9, adult: Secondary | ICD-10-CM

## 2023-08-09 DIAGNOSIS — I420 Dilated cardiomyopathy: Secondary | ICD-10-CM

## 2023-08-09 MED ORDER — ZEPBOUND 10 MG/0.5ML ~~LOC~~ SOAJ: 10.0000 mg | SUBCUTANEOUS | 3 refills | Status: DC

## 2023-08-09 NOTE — Progress Notes (Signed)
 ICD-10-CM   1. Chronic systolic congestive heart failure (HCC)  I50.22 tirzepatide (ZEPBOUND) 10 MG/0.5ML Pen    2. Dilated cardiomyopathy (HCC)  I42.0 tirzepatide (ZEPBOUND) 10 MG/0.5ML Pen    3. Class 2 severe obesity due to excess calories with serious comorbidity and body mass index (BMI) of 37.0 to 37.9 in adult [facility])  N82.956 tirzepatide (ZEPBOUND) 10 MG/0.5ML Pen   E66.01    Z68.37      No orders of the defined types were placed in this encounter.   Meds ordered this encounter  Medications   tirzepatide (ZEPBOUND) 10 MG/0.5ML Pen    Sig: Inject 10 mg into the skin once a week.    Dispense:  2 mL    Refill:  3    Please Rx Vial

## 2023-08-23 ENCOUNTER — Ambulatory Visit

## 2023-08-23 DIAGNOSIS — R55 Syncope and collapse: Secondary | ICD-10-CM | POA: Diagnosis not present

## 2023-08-27 LAB — CUP PACEART REMOTE DEVICE CHECK
Battery Remaining Longevity: 80 mo
Battery Remaining Percentage: 83 %
Battery Voltage: 2.99 V
Brady Statistic AP VP Percent: 3.3 %
Brady Statistic AP VS Percent: 1 %
Brady Statistic AS VP Percent: 97 %
Brady Statistic AS VS Percent: 1 %
Brady Statistic RA Percent Paced: 3.2 %
Date Time Interrogation Session: 20250320220500
HighPow Impedance: 64 Ohm
Implantable Lead Connection Status: 753985
Implantable Lead Connection Status: 753985
Implantable Lead Connection Status: 753985
Implantable Lead Implant Date: 20231222
Implantable Lead Implant Date: 20231222
Implantable Lead Implant Date: 20231222
Implantable Lead Location: 753858
Implantable Lead Location: 753859
Implantable Lead Location: 753860
Implantable Pulse Generator Implant Date: 20231222
Lead Channel Impedance Value: 410 Ohm
Lead Channel Impedance Value: 460 Ohm
Lead Channel Impedance Value: 810 Ohm
Lead Channel Pacing Threshold Amplitude: 0.75 V
Lead Channel Pacing Threshold Amplitude: 0.875 V
Lead Channel Pacing Threshold Amplitude: 1.25 V
Lead Channel Pacing Threshold Pulse Width: 0.5 ms
Lead Channel Pacing Threshold Pulse Width: 0.5 ms
Lead Channel Pacing Threshold Pulse Width: 0.5 ms
Lead Channel Sensing Intrinsic Amplitude: 1.3 mV
Lead Channel Sensing Intrinsic Amplitude: 12 mV
Lead Channel Setting Pacing Amplitude: 1.75 V
Lead Channel Setting Pacing Amplitude: 1.875
Lead Channel Setting Pacing Amplitude: 2 V
Lead Channel Setting Pacing Pulse Width: 0.5 ms
Lead Channel Setting Pacing Pulse Width: 0.5 ms
Lead Channel Setting Sensing Sensitivity: 0.5 mV
Pulse Gen Serial Number: 211011714
Zone Setting Status: 755011

## 2023-08-29 ENCOUNTER — Encounter: Payer: Self-pay | Admitting: Cardiovascular Disease

## 2023-10-04 NOTE — Addendum Note (Signed)
 Addended by: Lott Rouleau A on: 10/04/2023 08:31 AM   Modules accepted: Orders

## 2023-10-04 NOTE — Progress Notes (Signed)
 Remote ICD transmission.

## 2023-10-21 ENCOUNTER — Other Ambulatory Visit (INDEPENDENT_AMBULATORY_CARE_PROVIDER_SITE_OTHER): Payer: Self-pay | Admitting: Internal Medicine

## 2023-10-21 DIAGNOSIS — R632 Polyphagia: Secondary | ICD-10-CM

## 2023-11-22 ENCOUNTER — Ambulatory Visit (INDEPENDENT_AMBULATORY_CARE_PROVIDER_SITE_OTHER)

## 2023-11-22 DIAGNOSIS — R55 Syncope and collapse: Secondary | ICD-10-CM

## 2023-11-22 LAB — CUP PACEART REMOTE DEVICE CHECK
Battery Remaining Longevity: 77 mo
Battery Remaining Percentage: 79 %
Battery Voltage: 2.99 V
Brady Statistic AP VP Percent: 2.8 %
Brady Statistic AP VS Percent: 1 %
Brady Statistic AS VP Percent: 97 %
Brady Statistic AS VS Percent: 1 %
Brady Statistic RA Percent Paced: 2.7 %
Date Time Interrogation Session: 20250619020227
HighPow Impedance: 68 Ohm
Implantable Lead Connection Status: 753985
Implantable Lead Connection Status: 753985
Implantable Lead Connection Status: 753985
Implantable Lead Implant Date: 20231222
Implantable Lead Implant Date: 20231222
Implantable Lead Implant Date: 20231222
Implantable Lead Location: 753858
Implantable Lead Location: 753859
Implantable Lead Location: 753860
Implantable Pulse Generator Implant Date: 20231222
Lead Channel Impedance Value: 410 Ohm
Lead Channel Impedance Value: 480 Ohm
Lead Channel Impedance Value: 760 Ohm
Lead Channel Pacing Threshold Amplitude: 0.75 V
Lead Channel Pacing Threshold Amplitude: 0.875 V
Lead Channel Pacing Threshold Amplitude: 1.375 V
Lead Channel Pacing Threshold Pulse Width: 0.5 ms
Lead Channel Pacing Threshold Pulse Width: 0.5 ms
Lead Channel Pacing Threshold Pulse Width: 0.5 ms
Lead Channel Sensing Intrinsic Amplitude: 1.3 mV
Lead Channel Sensing Intrinsic Amplitude: 12 mV
Lead Channel Setting Pacing Amplitude: 1.875
Lead Channel Setting Pacing Amplitude: 1.875
Lead Channel Setting Pacing Amplitude: 2 V
Lead Channel Setting Pacing Pulse Width: 0.5 ms
Lead Channel Setting Pacing Pulse Width: 0.5 ms
Lead Channel Setting Sensing Sensitivity: 0.5 mV
Pulse Gen Serial Number: 211011714
Zone Setting Status: 755011

## 2023-11-23 ENCOUNTER — Ambulatory Visit: Payer: Self-pay | Admitting: Cardiovascular Disease

## 2023-11-28 ENCOUNTER — Other Ambulatory Visit: Payer: Self-pay | Admitting: Cardiology

## 2023-11-28 DIAGNOSIS — I420 Dilated cardiomyopathy: Secondary | ICD-10-CM

## 2023-11-28 DIAGNOSIS — I5022 Chronic systolic (congestive) heart failure: Secondary | ICD-10-CM

## 2023-12-11 ENCOUNTER — Ambulatory Visit: Payer: Self-pay | Admitting: Licensed Clinical Social Worker

## 2023-12-13 ENCOUNTER — Encounter: Payer: Self-pay | Admitting: Gastroenterology

## 2023-12-14 ENCOUNTER — Ambulatory Visit: Payer: Self-pay | Admitting: Licensed Clinical Social Worker

## 2023-12-26 ENCOUNTER — Ambulatory Visit (INDEPENDENT_AMBULATORY_CARE_PROVIDER_SITE_OTHER): Payer: Self-pay | Admitting: Behavioral Health

## 2023-12-26 ENCOUNTER — Encounter: Payer: Self-pay | Admitting: Behavioral Health

## 2023-12-26 VITALS — BP 116/62 | HR 80 | Ht 66.0 in | Wt 202.0 lb

## 2023-12-26 DIAGNOSIS — F411 Generalized anxiety disorder: Secondary | ICD-10-CM | POA: Diagnosis not present

## 2023-12-26 DIAGNOSIS — F4323 Adjustment disorder with mixed anxiety and depressed mood: Secondary | ICD-10-CM

## 2023-12-26 DIAGNOSIS — Z630 Problems in relationship with spouse or partner: Secondary | ICD-10-CM | POA: Diagnosis not present

## 2023-12-26 DIAGNOSIS — F331 Major depressive disorder, recurrent, moderate: Secondary | ICD-10-CM | POA: Diagnosis not present

## 2023-12-26 MED ORDER — AUVELITY 45-105 MG PO TBCR: EXTENDED_RELEASE_TABLET | ORAL | 1 refills | Status: DC

## 2023-12-26 MED ORDER — LORAZEPAM 1 MG PO TABS: 1.0000 mg | ORAL_TABLET | Freq: Two times a day (BID) | ORAL | 1 refills | Status: DC

## 2023-12-26 MED ORDER — SERTRALINE HCL 100 MG PO TABS: 100.0000 mg | ORAL_TABLET | Freq: Every day | ORAL | 1 refills | Status: DC

## 2023-12-26 NOTE — Progress Notes (Addendum)
 [facility] MD/PA/NP Initial Note  01/08/2024 6:39 PM KASEN ADDUCI  MRN:  [id number]  Chief Complaint:  Chief Complaint   Depression; Anxiety; Establish Care; Medication Problem; Patient Education; Medication Refill     HPI:  [name], 68 year old male presents to this office for initial visit and to establish care.  Collateral information should be considered reliable patient appears anxious and distressed.  Says that he was having a 2-year period of fair with another woman and his wife discovered the affair.  Says that his girlfriend wanted him to leave his wife but he was not entirely prepared to do so.  Says that he ended up losing both women.  Says his wife is filing for divorce and has obtained a Clinical research associate.  They are still living in the same house together but in separate rooms.  Says that his girlfriend refuses to reconcile and is now dating another man.  Says that he previously went to his PCP for help for anxiety and depression.  He was placed on Zoloft  approximately 1 week ago but says that he is desperate and the medication is not working fast enough.  He is also taking Xanax 1 mg 3 times daily as needed for anxiety but says that he still was not finding relief.  He is also speaking with a Retail banker and reading self-help guides.  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  He is open to finding professional help for counseling.  He reports that his anxiety today is 7/10 and depression is 9/10. Sleep has been poor waking up multiple times per night. Requesting medication to help with sleep. Appetite ok.  He denies hx of mania, no psychosis, no auditory or visual hallucinations.  Denies SI or HI   Past Psychiatric Medication Trials: Adderall XR and IR Wellbutrin  Ativan  Naltrexone  Zoloft     Visit Diagnosis:    ICD-10-CM   1. Major depressive disorder, recurrent episode, moderate (HCC)  F33.1 Dextromethorphan-buPROPion  ER (AUVELITY )  45-105 MG TBCR    sertraline  (ZOLOFT ) 100 MG tablet    2. Stress due to marital problems  Z63.0 LORazepam  (ATIVAN ) 1 MG tablet    3. Generalized anxiety disorder  F41.1 LORazepam  (ATIVAN ) 1 MG tablet    sertraline  (ZOLOFT ) 100 MG tablet    4. Adjustment disorder with mixed anxiety and depressed mood  F43.23 Dextromethorphan-buPROPion  ER (AUVELITY ) 45-105 MG TBCR    sertraline  (ZOLOFT ) 100 MG tablet      Past Psychiatric History: none   Past Medical History:  Past Medical History:  Diagnosis Date   ADD (attention deficit disorder)    ARTHRITIS    Arthritis    Back pain    Chest pain    CHF (congestive heart failure) (HCC)    CHONDROMALACIA PATELLA, LEFT    Edema of both lower extremities    EXOGENOUS OBESITY    GOUT    HIP REPLACEMENT, BILATERAL, HX OF    Hypertension    ICD BiV ICD [employer] GALLANT 05/26/2022 09/05/2022   Joint pain    Obstructive apnea    patient could not tolerate CPAP due to nasal airflow restriction.    Palpitations    Sleep apnea    SOB (shortness of breath)     Past Surgical History:  Procedure Laterality Date   BIV ICD INSERTION CRT-D N/A 05/26/2022   Procedure: BIV ICD INSERTION CRT-D;  Surgeon: Nancey Eulas BRAVO, MD;  Location: [facility] INVASIVE CV LAB;  Service: Cardiovascular;  Laterality: N/A;   JOINT REPLACEMENT     LEAD REVISION/REPAIR N/A 06/01/2022   Procedure: LEAD REVISION/REPAIR;  Surgeon: Cindie Ole DASEN, MD;  Location: [facility] INVASIVE CV LAB;  Service: Cardiovascular;  Laterality: N/A;   NASAL SINUS SURGERY     Dr . floy , October 2014 , nasal septum    RIGHT/LEFT HEART CATH AND CORONARY ANGIOGRAPHY N/A 05/05/2022   Procedure: RIGHT/LEFT HEART CATH AND CORONARY ANGIOGRAPHY;  Surgeon: Ladona Heinz, MD;  Location: [facility] INVASIVE CV LAB;  Service: Cardiovascular;  Laterality: N/A;   TOTAL HIP REVISION Right 03/03/2015   Procedure: RIGHT HIP BEARING SURFACE REVISION;  Surgeon: Dempsey Moan, MD;  Location: [facility] ORS;  Service: Orthopedics;   Laterality: Right;    Family Psychiatric History: see chart  Family History:  Family History  Problem Relation Age of Onset   Diabetes Father    High blood pressure Father    High Cholesterol Father    Heart disease Father    Kidney disease Father    Obesity Father    Heart disease Brother    Hypertension Brother    Hyperlipidemia Brother    Ataxia Neg Hx    Chorea Neg Hx    Dementia Neg Hx    Mental retardation Neg Hx    Migraines Neg Hx    Multiple sclerosis Neg Hx    Neurofibromatosis Neg Hx    Neuropathy Neg Hx    Parkinsonism Neg Hx    Seizures Neg Hx    Stroke Neg Hx     Social History:  Social History   Socioeconomic History   Marital status: Married    Spouse name: Married   Number of children: 0   Years of education: 16   Highest education level: Bachelor's degree (e.g., BA, AB, BS)  Occupational History   Occupation: Sports administrator  Tobacco Use   Smoking status: Never   Smokeless tobacco: Never  Vaping Use   Vaping status: Never Used  Substance and Sexual Activity   Alcohol use: No   Drug use: No   Sexual activity: Not Currently  Other Topics Concern   Not on file  Social History Narrative   Patient is married and lives alone.   Patient is working full-time.   Patient has a college education.   Patient is right-handed.   Patient drinks 5 cups of tea and sodas daily.   Social Drivers of Corporate investment banker Strain: Not on file  Food Insecurity: No Food Insecurity (06/02/2022)   Hunger Vital Sign    Worried About Running Out of Food in the Last Year: Never true    Ran Out of Food in the Last Year: Never true  Transportation Needs: No Transportation Needs (06/02/2022)   PRAPARE - Administrator, Civil Service (Medical): No    Lack of Transportation (Non-Medical): No  Physical Activity: Not on file  Stress: Not on file  Social Connections: Unknown (03/27/2022)   Received from [facility]   Social Network    Social  Network: Not on file    Allergies: No Known Allergies  Metabolic Disorder Labs: Lab Results  Component Value Date   HGBA1C 5.4 09/13/2022   No results found for: PROLACTIN Lab Results  Component Value Date   CHOL 158 09/13/2022   TRIG 60 09/13/2022   HDL 58 09/13/2022   CHOLHDL 5 11/04/2008   VLDL 13.2 11/04/2008   LDLCALC 88 09/13/2022   Lab Results  Component  Value Date   TSH 1.780 09/13/2022   TSH 1.32 11/04/2008    Therapeutic Level Labs: No results found for: LITHIUM No results found for: VALPROATE No results found for: CBMZ  Current Medications: Current Outpatient Medications  Medication Sig Dispense Refill   Dextromethorphan-buPROPion  ER (AUVELITY ) 45-105 MG TBCR Take one tablet by mouth twice daily 8 hours between doses 60 tablet 1   LORazepam  (ATIVAN ) 1 MG tablet Take 1 tablet (1 mg total) by mouth 2 (two) times daily. 60 tablet 1   zolpidem (AMBIEN) 10 MG tablet Take 10 mg by mouth at bedtime.     allopurinol (ZYLOPRIM) 100 MG tablet Take 100 mg by mouth daily.     carvedilol  (COREG ) 25 MG tablet Take 1 tablet (25 mg total) by mouth 2 (two) times daily. 180 tablet 3   ENTRESTO  97-103 MG Take 1 tablet by mouth 2 (two) times daily. 180 tablet 3   methocarbamol  (ROBAXIN ) 500 MG tablet Take 500 mg by mouth every 8 (eight) hours as needed for muscle spasms.     Multiple Vitamins-Minerals (MULTIVITAMIN WITH MINERALS) tablet Take 1 tablet by mouth daily.     Multiple Vitamins-Minerals (ZINC PO) Take 1 tablet by mouth daily.     naltrexone  (DEPADE) 50 MG tablet Take 1 tablet (50 mg total) by mouth daily. Start 0.5 (25 mg) tab daily for 1 week. 30 tablet 1   QUEtiapine  (SEROQUEL ) 50 MG tablet Take 1 tablet (50 mg total) by mouth at bedtime. 30 tablet 0   rosuvastatin  (CRESTOR ) 20 MG tablet Take 1 tablet (20 mg total) by mouth daily. 90 tablet 3   sertraline  (ZOLOFT ) 100 MG tablet Take 1 tablet (100 mg total) by mouth daily. 30 tablet 1   spironolactone   (ALDACTONE ) 25 MG tablet Take 1 tablet (25 mg total) by mouth daily. 90 tablet 3   tadalafil (CIALIS) 5 MG tablet Take 5 mg by mouth at bedtime.     tamsulosin  (FLOMAX ) 0.4 MG CAPS capsule Take 0.4 mg by mouth at bedtime.     testosterone cypionate (DEPOTESTOSTERONE CYPIONATE) 200 MG/ML injection Inject 200 mg into the muscle once a week.     tirzepatide  (ZEPBOUND ) 10 MG/0.5ML injection vial INJECT 0.5 ML (10 MG) UNDER THE SKIN ONCE WEEKLY (0.5ML= 50 UNITS) 2 mL 11   topiramate  (TOPAMAX ) 25 MG tablet Take 1 tablet (25 mg total) by mouth daily. 30 tablet 0   No current facility-administered medications for this visit.    Medication Side Effects: none  Orders placed this visit:  No orders of the defined types were placed in this encounter.   Psychiatric Specialty Exam:  Review of Systems  Constitutional: Negative.   Allergic/Immunologic: Negative.   Neurological: Negative.   Psychiatric/Behavioral:  Positive for decreased concentration and dysphoric mood. The patient is nervous/anxious.     Blood pressure 116/62, pulse 80, height 5' 6 (1.676 m), weight 202 lb (91.6 kg).Body mass index is 32.6 kg/m.  General Appearance: Casual and Neat  Eye Contact:  Good  Speech:  Talkative  Volume:  Normal  Mood:  Anxious, Depressed, and Dysphoric  Affect:  Congruent, Depressed, and Anxious  Thought Process:  Coherent  Orientation:  Full (Time, Place, and Person)  Thought Content: Logical   Suicidal Thoughts:  No  Homicidal Thoughts:  No  Memory:  WNL  Judgement:  Good  Insight:  Good  Psychomotor Activity:  Normal  Concentration:  Concentration: Good  Recall:  Good  Fund of Knowledge: Good  Language: Good  Assets:  Desire for Improvement  ADL's:  Intact  Cognition: WNL  Prognosis:  Good   Screenings:  PHQ2-9    Flowsheet Row Office Visit from 12/26/2023 in [facility]  PHQ-2 Total Score 3  PHQ-9 Total Score 15   Flowsheet Row Admission (Discharged)  from 06/01/2022 in [facility] 3E HF PCU  C-SSRS RISK CATEGORY No Risk    Receiving Psychotherapy: No   Treatment Plan/Recommendations:  Greater than 50% of 60 min  face to face time with patient was spent on counseling and coordination of care. We discussed his recent marital conflict and dynamics.  We talked about his report of having a marital affair and increased stress, anxiety, and depression.  He understands that it is situational but patient appears severely distressed.  We talked about his previous POC with PCP and current medications.  Patient did not have a clear understanding that antidepressants normally take 4 to 6 weeks to work or longer.  He is feeling a sense of desperation.  We discussed possible medication options or adjunctive therapies.  Patient is also interested in speaking with a counselor.  We agreed today to: Will start Auvelity  one tablet daily for 3 days, then two tablets daily 8 hours between doses. Will stop Xanax  To start Ativan  1 mg twice daily. Take one tablet in the am and one at supper time. To continue Zoloft  100 mg daily in the am after breakfast To follow up in 4 weeks to reassess To report worsening symptoms or side effects promptly Discussed potential benefits, risk, and side effects of benzodiazepines to include potential risk of tolerance and dependence, as well as possible drowsiness.  Advised patient not to drive if experiencing drowsiness and to take lowest possible effective dose to minimize risk of dependence and tolerance.  Provided emergency contact information  Reviewed PDMP     Redell DELENA Pizza, NP

## 2023-12-27 ENCOUNTER — Telehealth: Payer: Self-pay | Admitting: Behavioral Health

## 2023-12-27 ENCOUNTER — Telehealth: Payer: Self-pay

## 2023-12-27 NOTE — Telephone Encounter (Signed)
 Tried returning call but they already processed PA

## 2023-12-27 NOTE — Telephone Encounter (Signed)
 [facility] called saying they need more clinical info to complete his PA for Auvelity    [phone]  option 2

## 2023-12-27 NOTE — Telephone Encounter (Signed)
 PA Auvelity  45-105 mg #60/30 day, [facility] [employer]

## 2024-01-01 ENCOUNTER — Telehealth: Payer: Self-pay | Admitting: Behavioral Health

## 2024-01-01 ENCOUNTER — Ambulatory Visit: Admitting: Mental Health

## 2024-01-01 MED ORDER — QUETIAPINE FUMARATE 50 MG PO TABS: 50.0000 mg | ORAL_TABLET | Freq: Every day | ORAL | 0 refills | Status: DC

## 2024-01-01 NOTE — Telephone Encounter (Addendum)
 Pt reporting not sleeping. Said he took Ambien last night with no benefit. 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  Reviewed sleep hygiene with patient. No caffeine use. Usually reads before bed.   Asking for something today.

## 2024-01-01 NOTE — Telephone Encounter (Signed)
 Pt Lvm  @ 8:16a that he hasn't slept in almost 3 days.  I called him back because he had an appt today with [place] which I assumed he would not be able to do if he hasn't slept.  He stated he has probably slept 1.5 hours in the last 3 days.  He said he took Ambien last night and it didn't help.  He doesn't know when he should take the Prozac so he wants direction on that.  He said he's wondering if he can get something else for sleep.  His PCP said he would have to get it from [facility].   Next appt 8/21

## 2024-01-01 NOTE — Telephone Encounter (Signed)
Sent Seroquel

## 2024-01-01 NOTE — Telephone Encounter (Signed)
 Please pend Seroquel  50 mg to try at bedtime. If to sedated after a couple days trial he can try half of tablet.

## 2024-01-03 NOTE — Telephone Encounter (Signed)
 Contacted pt to discuss Auvelity , pt reports its working but I am almost out. He received 1 bottle. He started taking after his visit on 7/23. Advised him to stop by office tomorrow to pick up more samples. I will contact insurance to follow up on the PA.   Pt also reports he started Seroquel  50 mg, he took for the first time last night and said it worked. Pt asking if it would cause him to not be able to urinate. Informed him after 1 dose that shouldn't be the cause of it. He stated must be something else. Informed him I would let Redell know. Pt's next apt is 8/21.

## 2024-01-03 NOTE — Telephone Encounter (Signed)
 Noted thank. Most likely not medication problem. If persist need to follow up with PCP or Urology

## 2024-01-08 ENCOUNTER — Telehealth: Payer: Self-pay | Admitting: Behavioral Health

## 2024-01-08 NOTE — Telephone Encounter (Signed)
 Next visit is 01/24/24. [name] would like to talk to a nurse about the effects of Lorazepam  and Auvility and the effects of them on his body. His phone number is [phone].

## 2024-01-08 NOTE — Telephone Encounter (Signed)
 Pt c/o SSE. He thinks it is the Auvelity . I told him it was likely the Zoloft . He reports nothing works. He realizes he probably needs the Zoloft  for now, until he can get things straightened out. Reports trying to reconcile with his wife. He asks if you can prescribe something to help with the SSE. Has been on Cialis previously, but he reports that was for nocturia.

## 2024-01-09 NOTE — Telephone Encounter (Signed)
 He was on Wellbutrin  before. You can ask him about this. Sometimes used to offset SSE with ADs. However, not always effective. He has to weigh his options and what is most important to him now. If we stay on Zoloft , we may be increasing next visit. He needs to consider these things.

## 2024-01-10 ENCOUNTER — Encounter: Payer: Self-pay | Admitting: Gastroenterology

## 2024-01-10 ENCOUNTER — Ambulatory Visit (AMBULATORY_SURGERY_CENTER): Admitting: *Deleted

## 2024-01-10 VITALS — Ht 66.0 in | Wt 210.0 lb

## 2024-01-10 DIAGNOSIS — Z1211 Encounter for screening for malignant neoplasm of colon: Secondary | ICD-10-CM

## 2024-01-10 MED ORDER — NA SULFATE-K SULFATE-MG SULF 17.5-3.13-1.6 GM/177ML PO SOLN: 1.0000 | Freq: Once | ORAL | 0 refills | Status: AC

## 2024-01-10 NOTE — Progress Notes (Addendum)
 Pt's name and DOB verified at the beginning of the pre-visit with 2 identifiers   Pt denies any difficulty with ambulating,sitting, laying down or rolling side to side   Pt uses ambulation assistance device or has issues with mobiiity  Pt has no issues moving head neck or swallowing  No egg or soy allergy known to patient   No issues known to pt with past sedation with any surgeries or procedures  No FH of Malignant Hyperthermia  Pt is not on home 02   Pt is not on blood thinners   Pt denies issues with constipation   Pt is not on dialysis  Pt hx palpitations   Pt denies any upcoming cardiac testing   Chart not reviewed by CRNA prior to PV Pt states he understands he is to hold Zepbound  for 7 days prior to procedure.  Visit by phone  Pt states weight is 210 lb  IInstructions reviewed. Pt given  both [facility] main # and MD on call # prior to instructions. Instructions reviewed. Pt given  both [facility] main # and MD on call # prior to instructions.   Informed pt to come in at the time discussed and is shown on PV instructions. Pt informed that they are coming in i.e. cloths change.,IV placement , Consent signing and meeting CRNA. Pt states understanding after  given opportunity to ask questions t after all  instructions given. Instructed pt to review instructions again prior to procedure and call main # given if has any questions.. Pt states they will.  Instructed pt where to find PV instructions in My Chart

## 2024-01-10 NOTE — Telephone Encounter (Signed)
 Patient called back regarding previous messages. He is waiting for a rtc. Pls rtc [phone]

## 2024-01-10 NOTE — Telephone Encounter (Signed)
 Called patient and reviewed recommendations from [place].  He wants another AD that won't cause SSE. I told him all had potential to do that. He said he can't live w/o that and is asking for Viagra. He has FU with Redell on 8/21, but wants to do something know. I told him Redell was out of the office until Monday.  Told him that whatever changes are made will not be an overnight fix. He has appt to see [place] tomorrow.

## 2024-01-11 ENCOUNTER — Ambulatory Visit (INDEPENDENT_AMBULATORY_CARE_PROVIDER_SITE_OTHER): Admitting: Mental Health

## 2024-01-11 ENCOUNTER — Ambulatory Visit: Admitting: Mental Health

## 2024-01-11 DIAGNOSIS — Z630 Problems in relationship with spouse or partner: Secondary | ICD-10-CM

## 2024-01-11 DIAGNOSIS — F4323 Adjustment disorder with mixed anxiety and depressed mood: Secondary | ICD-10-CM | POA: Diagnosis not present

## 2024-01-11 NOTE — Progress Notes (Signed)
 [facility] Counselor Initial Adult Exam  Name: [name] Date: 01/11/2024 MRN: [id number] DOB: [birth year] PCP: Valma Carwin, MD  Time spent: 50 minutes  Reason for Visit /Presenting Problem: he reports being married for 6 years, last 2  years his wife became less social. She would come home and go to bed soon thereafter. He started seeing another woman who he knew in high school for the past 2 years. She wants him to leave his wife but he has hesitated due to feeling it would upset his family, his mother specifically. His wife found an email from his gf, then requested a divorce. His gf started seeing someone else a few weeks ago. His wife served her with alienation of affection. His gf wants no contact with him in order to move on. He continues to think about his gf, continues to have feelings for her.  He stated that he and his wife will probably move toward divorce where he shared he has acceptance of going on to share aspects of the relationships that have been he feels leading to this outcome.  He identified efforts he is made to work on moving past the feelings he had for his girlfriend.  He stated they continue to resonate.  He has made efforts to journal, writing down reasons he needs to move on from this relationship.  He wants to continue to make progress in this area, to move forward with his life. Recommended he return to therapy in 2 weeks and continue med management with Redell Pizza, NP    Mental Status Exam:    Appearance:    Casual     Behavior:   Appropriate  Motor:   WNL  Speech/Language:    Clear and Coherent  Affect:   Full range   Mood:   Euthymic  Thought process:   Logical, linear, goal directed  Thought content:     WNL  Sensory/Perceptual disturbances:     none  Orientation:   x4  Attention:   Good  Concentration:   Good  Memory:   Intact  Fund of knowledge:    Consistent with age and development  Insight:     Good  Judgment:    Good  Impulse Control:   Good      Reported Symptoms:  rumination, anxiety, sadness  Risk Assessment:  Risk Assessment: Danger to Self:  No Self-injurious Behavior: No Danger to Others: No Duty to Warn:no Physical Aggression / Violence:No  Access to Firearms a concern: No  Gang Involvement:No  Patient / guardian was educated about steps to take if suicide or homicide risk level increases between visits: yes While future psychiatric events cannot be accurately predicted, the patient does not currently require acute inpatient psychiatric care and does not currently meet [facility]  involuntary commitment criteria.   Substance Abuse History: Current substance abuse:  none  Past Psychiatric History:   Outpatient Providers: Redell Pizza, NP   [facility]  History of Psych Hospitalization: No  Psychological Testing: none  Abuse History: Victim - none    Family History:  Family History  Problem Relation Age of Onset   Diabetes Father    High blood pressure Father    High Cholesterol Father    Heart disease Father    Kidney disease Father    Obesity Father    Heart disease Brother    Hypertension Brother    Hyperlipidemia Brother    Ataxia Neg Hx    Chorea Neg Hx  Dementia Neg Hx    Mental retardation Neg Hx    Migraines Neg Hx    Multiple sclerosis Neg Hx    Neurofibromatosis Neg Hx    Neuropathy Neg Hx    Parkinsonism Neg Hx    Seizures Neg Hx    Stroke Neg Hx    Colon cancer Neg Hx    Colon polyps Neg Hx    Esophageal cancer Neg Hx    Rectal cancer Neg Hx    Stomach cancer Neg Hx     Living situation: the patient lives with their family  Sexual Orientation:  Straight  Relationship Status: married x 6 years;  together for 8 dating             If a parent, number of children / ages: none  Support Systems; friends  Surveyor, quantity Stress:  No   Income/Employment/Disability: Employment  Financial planner: No   Educational History: Education: college  Recreation/Hobbies: working  often  Stressors: marital  Strengths:  Supportive Relationships and employment  Barriers:   none  Legal History: Pending legal issue / charges: none History of legal issue / charges: none  Medical History/Surgical History: Past Medical History:  Diagnosis Date   ADD (attention deficit disorder)    ARTHRITIS    Arthritis    Back pain    Chest pain    CHF (congestive heart failure) (HCC)    CHONDROMALACIA PATELLA, LEFT    Depression    Edema of both lower extremities    EXOGENOUS OBESITY    GOUT    HIP REPLACEMENT, BILATERAL, HX OF    Hyperlipidemia    Hypertension    ICD BiV ICD [employer] GALLANT 05/26/2022 09/05/2022   Joint pain    Obstructive apnea    patient could not tolerate CPAP due to nasal airflow restriction.    Palpitations    Sleep apnea    SOB (shortness of breath)     Past Surgical History:  Procedure Laterality Date   BIV ICD INSERTION CRT-D N/A 05/26/2022   Procedure: BIV ICD INSERTION CRT-D;  Surgeon: Nancey Eulas BRAVO, MD;  Location: [facility] INVASIVE CV LAB;  Service: Cardiovascular;  Laterality: N/A;   COLONOSCOPY     JOINT REPLACEMENT     LEAD REVISION/REPAIR N/A 06/01/2022   Procedure: LEAD REVISION/REPAIR;  Surgeon: Cindie Ole DASEN, MD;  Location: [facility] INVASIVE CV LAB;  Service: Cardiovascular;  Laterality: N/A;   NASAL SINUS SURGERY     Dr . floy , October 2014 , nasal septum    RIGHT/LEFT HEART CATH AND CORONARY ANGIOGRAPHY N/A 05/05/2022   Procedure: RIGHT/LEFT HEART CATH AND CORONARY ANGIOGRAPHY;  Surgeon: Ladona Heinz, MD;  Location: [facility] INVASIVE CV LAB;  Service: Cardiovascular;  Laterality: N/A;   TOTAL HIP REVISION Right 03/03/2015   Procedure: RIGHT HIP BEARING SURFACE REVISION;  Surgeon: Dempsey Moan, MD;  Location: [facility] ORS;  Service: Orthopedics;  Laterality: Right;    Medications: Current Outpatient Medications  Medication Sig Dispense Refill   allopurinol (ZYLOPRIM) 100 MG tablet Take 100 mg by mouth daily.     carvedilol  (COREG )  25 MG tablet Take 1 tablet (25 mg total) by mouth 2 (two) times daily. 180 tablet 3   Dextromethorphan-buPROPion  ER (AUVELITY ) 45-105 MG TBCR Take one tablet by mouth twice daily 8 hours between doses 60 tablet 1   ENTRESTO  97-103 MG Take 1 tablet by mouth 2 (two) times daily. 180 tablet 3   LORazepam  (ATIVAN ) 1 MG tablet Take 1 tablet (1 mg  total) by mouth 2 (two) times daily. 60 tablet 1   methocarbamol  (ROBAXIN ) 500 MG tablet Take 500 mg by mouth every 8 (eight) hours as needed for muscle spasms.     Multiple Vitamins-Minerals (MULTIVITAMIN WITH MINERALS) tablet Take 1 tablet by mouth daily.     Multiple Vitamins-Minerals (ZINC PO) Take 1 tablet by mouth daily. (Patient not taking: Reported on 01/10/2024)     naltrexone  (DEPADE) 50 MG tablet Take 1 tablet (50 mg total) by mouth daily. Start 0.5 (25 mg) tab daily for 1 week. (Patient not taking: Reported on 01/10/2024) 30 tablet 1   QUEtiapine  (SEROQUEL ) 50 MG tablet Take 1 tablet (50 mg total) by mouth at bedtime. (Patient not taking: Reported on 01/10/2024) 30 tablet 0   rosuvastatin  (CRESTOR ) 20 MG tablet Take 1 tablet (20 mg total) by mouth daily. 90 tablet 3   sertraline  (ZOLOFT ) 100 MG tablet Take 1 tablet (100 mg total) by mouth daily. 30 tablet 1   spironolactone  (ALDACTONE ) 25 MG tablet Take 1 tablet (25 mg total) by mouth daily. 90 tablet 3   tadalafil (CIALIS) 5 MG tablet Take 5 mg by mouth at bedtime.     tamsulosin  (FLOMAX ) 0.4 MG CAPS capsule Take 0.4 mg by mouth at bedtime.     testosterone cypionate (DEPOTESTOSTERONE CYPIONATE) 200 MG/ML injection Inject 200 mg into the muscle once a week.     tirzepatide  (ZEPBOUND ) 10 MG/0.5ML injection vial INJECT 0.5 ML (10 MG) UNDER THE SKIN ONCE WEEKLY (0.5ML= 50 UNITS) 2 mL 11   topiramate  (TOPAMAX ) 25 MG tablet Take 1 tablet (25 mg total) by mouth daily. 30 tablet 0   zolpidem (AMBIEN) 10 MG tablet Take 10 mg by mouth at bedtime. (Patient not taking: Reported on 01/10/2024)     No current  facility-administered medications for this visit.      Diagnoses:    ICD-10-CM   1. Stress due to marital problems  Z63.0     2. Adjustment disorder with mixed anxiety and depressed mood  F43.23       Plan of Care: TBD   Lonni Fischer, [facility]

## 2024-01-11 NOTE — Telephone Encounter (Signed)
 Resubmitted PA for Auvelity  via fax with more updated information to [employer], received an approval on 01/09/2024-06/04/2024.

## 2024-01-14 ENCOUNTER — Other Ambulatory Visit: Payer: Self-pay | Admitting: Behavioral Health

## 2024-01-14 DIAGNOSIS — F4323 Adjustment disorder with mixed anxiety and depressed mood: Secondary | ICD-10-CM

## 2024-01-14 DIAGNOSIS — F331 Major depressive disorder, recurrent, moderate: Secondary | ICD-10-CM

## 2024-01-14 DIAGNOSIS — Z630 Problems in relationship with spouse or partner: Secondary | ICD-10-CM

## 2024-01-14 DIAGNOSIS — F411 Generalized anxiety disorder: Secondary | ICD-10-CM

## 2024-01-14 MED ORDER — VILAZODONE HCL 20 MG PO TABS: ORAL_TABLET | ORAL | 1 refills | Status: DC

## 2024-01-14 NOTE — Telephone Encounter (Signed)
 I am not going to prescribe Viagra, it is no more effective than Cialis  which he is already on. It will take some time to wash. Please clarify if he is taking the Seroquel  for sleep. Told Traci it was working but recently noted to another provider he wasn't taking.

## 2024-01-14 NOTE — Telephone Encounter (Signed)
 I sent Viibryd  20 mg to pharmacy. Please instruct him to take 1/2 tablet for one week, then whole tablet. He should cut Zoloft  100 mg in half for 4 days, then 1/4 tablet for 4 days, then stop

## 2024-01-14 NOTE — Telephone Encounter (Signed)
 Reviewed recommendations with patient. He reported being in his car. Sent instructions via MyChart.

## 2024-01-17 ENCOUNTER — Other Ambulatory Visit: Payer: Self-pay | Admitting: Behavioral Health

## 2024-01-17 DIAGNOSIS — F331 Major depressive disorder, recurrent, moderate: Secondary | ICD-10-CM

## 2024-01-17 DIAGNOSIS — F4323 Adjustment disorder with mixed anxiety and depressed mood: Secondary | ICD-10-CM

## 2024-01-17 DIAGNOSIS — F411 Generalized anxiety disorder: Secondary | ICD-10-CM

## 2024-01-24 ENCOUNTER — Other Ambulatory Visit: Payer: Self-pay | Admitting: Behavioral Health

## 2024-01-24 ENCOUNTER — Ambulatory Visit: Admitting: Behavioral Health

## 2024-01-24 ENCOUNTER — Ambulatory Visit (AMBULATORY_SURGERY_CENTER): Admitting: Gastroenterology

## 2024-01-24 ENCOUNTER — Encounter: Payer: Self-pay | Admitting: Gastroenterology

## 2024-01-24 ENCOUNTER — Encounter: Payer: Self-pay | Admitting: Behavioral Health

## 2024-01-24 VITALS — BP 133/58 | HR 65 | Temp 97.6°F | Resp 18 | Ht 66.0 in | Wt 210.0 lb

## 2024-01-24 DIAGNOSIS — Z630 Problems in relationship with spouse or partner: Secondary | ICD-10-CM

## 2024-01-24 DIAGNOSIS — N522 Drug-induced erectile dysfunction: Secondary | ICD-10-CM

## 2024-01-24 DIAGNOSIS — F411 Generalized anxiety disorder: Secondary | ICD-10-CM

## 2024-01-24 DIAGNOSIS — K5730 Diverticulosis of large intestine without perforation or abscess without bleeding: Secondary | ICD-10-CM | POA: Diagnosis not present

## 2024-01-24 DIAGNOSIS — Z1211 Encounter for screening for malignant neoplasm of colon: Secondary | ICD-10-CM

## 2024-01-24 DIAGNOSIS — F99 Mental disorder, not otherwise specified: Secondary | ICD-10-CM

## 2024-01-24 DIAGNOSIS — F331 Major depressive disorder, recurrent, moderate: Secondary | ICD-10-CM

## 2024-01-24 DIAGNOSIS — K648 Other hemorrhoids: Secondary | ICD-10-CM

## 2024-01-24 DIAGNOSIS — K640 First degree hemorrhoids: Secondary | ICD-10-CM

## 2024-01-24 DIAGNOSIS — F4323 Adjustment disorder with mixed anxiety and depressed mood: Secondary | ICD-10-CM

## 2024-01-24 DIAGNOSIS — F5105 Insomnia due to other mental disorder: Secondary | ICD-10-CM

## 2024-01-24 MED ORDER — VILAZODONE HCL 20 MG PO TABS: ORAL_TABLET | ORAL | 1 refills | Status: DC

## 2024-01-24 MED ORDER — TADALAFIL 20 MG PO TABS
20.0000 mg | ORAL_TABLET | Freq: Every day | ORAL | 0 refills | Status: DC | PRN
Start: 2024-01-24 — End: 2024-01-24

## 2024-01-24 MED ORDER — QUETIAPINE FUMARATE 50 MG PO TABS: 50.0000 mg | ORAL_TABLET | Freq: Every day | ORAL | 1 refills | Status: DC

## 2024-01-24 MED ORDER — LORAZEPAM 1 MG PO TABS: 1.0000 mg | ORAL_TABLET | Freq: Two times a day (BID) | ORAL | 3 refills | Status: DC

## 2024-01-24 MED ORDER — SODIUM CHLORIDE 0.9 % IV SOLN
500.0000 mL | INTRAVENOUS | Status: DC
Start: 2024-01-24 — End: 2024-01-24

## 2024-01-24 MED ORDER — TADALAFIL 20 MG PO TABS: ORAL_TABLET | ORAL | 2 refills | Status: DC

## 2024-01-24 MED ORDER — AUVELITY 45-105 MG PO TBCR: EXTENDED_RELEASE_TABLET | ORAL | 3 refills | Status: DC

## 2024-01-24 NOTE — Progress Notes (Signed)
 [facility] Med Check  Patient ID: [name],  MRN: [id number]  PCP: Darin Carwin, MD  Date of Evaluation: 01/24/2024 Time spent:30 minutes  Chief Complaint:  Chief Complaint   Anxiety; Follow-up; Medication Refill; Patient Education; Depression; Sexual Problem     HISTORY/CURRENT STATUS: HPI [name], 68 year old male presents to this office for follow up and medication management. Collateral information should be considered reliable. Patient is much more calm and collected today. Less psychomotor agitation. Says that he is doing much better. Still concerned about the medications causing ED. Requesting stronger dose of Cialis .  Say that his life is still crazy but calming down. He does believe that medications are helping. He reports that his anxiety today is 3/10 and depression is 3/10. Sleep has improved with Seroquel . No waking up much during night. Requesting medication to help with sleep. Appetite ok.  He denies hx of mania, no psychosis, no auditory or visual hallucinations.  Denies SI or HI    Past Psychiatric Medication Trials: Adderall XR and IR Wellbutrin  Ativan  Naltrexone  Zoloft    Individual Medical History/ Review of Systems: Changes? :No   Allergies: Patient has no known allergies.  Current Medications:  Current Outpatient Medications:    tadalafil  (CIALIS ) 20 MG tablet, Take 1/2 to one whole tablet by mouth daily as needed, Disp: 30 tablet, Rfl: 2   allopurinol (ZYLOPRIM) 100 MG tablet, Take 100 mg by mouth daily., Disp: , Rfl:    carvedilol  (COREG ) 25 MG tablet, Take 1 tablet (25 mg total) by mouth 2 (two) times daily., Disp: 180 tablet, Rfl: 3   Dextromethorphan-buPROPion  ER (AUVELITY ) 45-105 MG TBCR, Take one tablet by mouth twice daily 8 hours between doses, Disp: 60 tablet, Rfl: 3   ENTRESTO  97-103 MG, Take 1 tablet by mouth 2 (two) times daily., Disp: 180 tablet, Rfl: 3   LORazepam  (ATIVAN ) 1 MG tablet, Take 1 tablet (1 mg total) by mouth 2 (two)  times daily., Disp: 60 tablet, Rfl: 3   methocarbamol  (ROBAXIN ) 500 MG tablet, Take 500 mg by mouth every 8 (eight) hours as needed for muscle spasms., Disp: , Rfl:    Multiple Vitamins-Minerals (MULTIVITAMIN WITH MINERALS) tablet, Take 1 tablet by mouth daily., Disp: , Rfl:    Multiple Vitamins-Minerals (ZINC PO), Take 1 tablet by mouth daily. (Patient not taking: Reported on 01/10/2024), Disp: , Rfl:    naltrexone  (DEPADE) 50 MG tablet, Take 1 tablet (50 mg total) by mouth daily. Start 0.5 (25 mg) tab daily for 1 week. (Patient not taking: Reported on 01/10/2024), Disp: 30 tablet, Rfl: 1   QUEtiapine  (SEROQUEL ) 50 MG tablet, Take 1 tablet (50 mg total) by mouth at bedtime., Disp: 90 tablet, Rfl: 1   rosuvastatin  (CRESTOR ) 20 MG tablet, Take 1 tablet (20 mg total) by mouth daily., Disp: 90 tablet, Rfl: 3   sertraline  (ZOLOFT ) 100 MG tablet, Take 1 tablet (100 mg total) by mouth daily., Disp: 30 tablet, Rfl: 1   spironolactone  (ALDACTONE ) 25 MG tablet, Take 1 tablet (25 mg total) by mouth daily., Disp: 90 tablet, Rfl: 3   tadalafil  (CIALIS ) 5 MG tablet, Take 5 mg by mouth at bedtime., Disp: , Rfl:    tamsulosin  (FLOMAX ) 0.4 MG CAPS capsule, Take 0.4 mg by mouth at bedtime., Disp: , Rfl:    testosterone cypionate (DEPOTESTOSTERONE CYPIONATE) 200 MG/ML injection, Inject 200 mg into the muscle once a week., Disp: , Rfl:    tirzepatide  (ZEPBOUND ) 10 MG/0.5ML injection vial, INJECT 0.5 ML (10 MG) UNDER THE SKIN ONCE  WEEKLY (0.5ML= 50 UNITS), Disp: 2 mL, Rfl: 11   topiramate  (TOPAMAX ) 25 MG tablet, Take 1 tablet (25 mg total) by mouth daily., Disp: 30 tablet, Rfl: 0   Vilazodone  HCl 20 MG TABS, Take one tablet by mouth daily. Must take with food., Disp: 90 tablet, Rfl: 1   zolpidem (AMBIEN) 10 MG tablet, Take 10 mg by mouth at bedtime. (Patient not taking: Reported on 01/10/2024), Disp: , Rfl:  Medication Side Effects: none  Family Medical/ Social History: Changes? No  MENTAL HEALTH EXAM:  There were no  vitals taken for this visit.There is no height or weight on file to calculate BMI.  General Appearance: Casual, Neat, and Well Groomed  Eye Contact:  Good  Speech:  Clear and Coherent  Volume:  Normal  Mood:  Anxious and Depressed  Affect:  Appropriate, Depressed, and Anxious  Thought Process:  Coherent  Orientation:  Full (Time, Place, and Person)  Thought Content: Logical   Suicidal Thoughts:  No  Homicidal Thoughts:  No  Memory:  WNL  Judgement:  Fair  Insight:  Fair  Psychomotor Activity:  Normal  Concentration:  Concentration: Fair  Recall:  Fair  Fund of Knowledge: Fair  Language: Good  Assets:  Desire for Improvement  ADL's:  Intact  Cognition: WNL  Prognosis:  Good    DIAGNOSES:    ICD-10-CM   1. 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 Generalized anxiety disorder  F41.1 Vilazodone  HCl 20 MG TABS    LORazepam  (ATIVAN ) 1 MG tablet    3. Major depressive disorder, recurrent episode, moderate (HCC)  F33.1 Dextromethorphan-buPROPion  ER (AUVELITY ) 45-105 MG TBCR    Vilazodone  HCl 20 MG TABS    4. Stress due to marital problems  Z63.0 LORazepam  (ATIVAN ) 1 MG tablet    5. Insomnia due to other mental disorder  F51.05 QUEtiapine  (SEROQUEL ) 50 MG tablet   F99     6. Adjustment disorder with mixed anxiety and depressed mood  F43.23 Dextromethorphan-buPROPion  ER (AUVELITY ) 45-105 MG TBCR    Vilazodone  HCl 20 MG TABS      Receiving Psychotherapy: yes, Medford Fischer   RECOMMENDATIONS:   Greater than 50% of 60 min  face to face time with patient was spent on counseling and coordination of care. We discussed his report of moderate improvement since last visit. Says medications are starting to work well. He says he is experiencing more erectile dysfunction on his meds and is asking for higher dose of cialis . Had questions about how long he should be on this medication. I reinforced that  medications take time to work and that often patients stop their medication to soon or when they start feeling better, then experience relapse. He expressed understanding and is committed to taking as prescribed.     We agreed today to:  To continue Auvelity  one tablet daily for 3 days, then two tablets daily 8 hours between doses. To continue Ativan  1 mg twice daily. Take one tablet in the am and one at supper time. To continue Viibryd  20 mg daily. Must take with food To start Cialis  20 mg daily as needed before sexual activity. Start with 1/2 tablet and see if this is effective. Do not take low dose cialis  on days when higher dose for sex is needed.  To follow up in 3 months to reassess To report worsening symptoms or side effects promptly Discussed potential benefits,  risk, and side effects of benzodiazepines to include potential risk of tolerance and dependence, as well as possible drowsiness.  Advised patient not to drive if experiencing drowsiness and to take lowest possible effective dose to minimize risk of dependence and tolerance.  Provided emergency contact information   Reviewed PDMP        Redell DELENA Pizza, NP

## 2024-01-24 NOTE — Patient Instructions (Signed)
 Educational handout provided to patient related to Hemorrhoids and Diverticulosis  Resume previous diet  Continue present medications  REPEAT COLONOSCOPY IN 10 YEARS FOR SCREENING PURPOSES   YOU HAD AN ENDOSCOPIC PROCEDURE TODAY AT THE [facility]:   Refer to the procedure report that was given to you for any specific questions about what was found during the examination.  If the procedure report does not answer your questions, please call your gastroenterologist to clarify.  If you requested that your care partner not be given the details of your procedure findings, then the procedure report has been included in a sealed envelope for you to review at your convenience later.  YOU SHOULD EXPECT: Some feelings of bloating in the abdomen. Passage of more gas than usual.  Walking can help get rid of the air that was put into your GI tract during the procedure and reduce the bloating. If you had a lower endoscopy (such as a colonoscopy or flexible sigmoidoscopy) you may notice spotting of blood in your stool or on the toilet paper. If you underwent a bowel prep for your procedure, you may not have a normal bowel movement for a few days.  Please Note:  You might notice some irritation and congestion in your nose or some drainage.  This is from the oxygen used during your procedure.  There is no need for concern and it should clear up in a day or so.  SYMPTOMS TO REPORT IMMEDIATELY:  Following lower endoscopy (colonoscopy or flexible sigmoidoscopy):  Excessive amounts of blood in the stool  Significant tenderness or worsening of abdominal pains  Swelling of the abdomen that is new, acute  Fever of 100F or higher  For urgent or emergent issues, a gastroenterologist can be reached at any hour by calling [phone]. Do not use MyChart messaging for urgent concerns.    DIET:  We do recommend a small meal at first, but then you may proceed to your regular diet.  Drink plenty of  fluids but you should avoid alcoholic beverages for 24 hours.  ACTIVITY:  You should plan to take it easy for the rest of today and you should NOT DRIVE or use heavy machinery until tomorrow (because of the sedation medicines used during the test).    FOLLOW UP: Our staff will call the number listed on your records the next business day following your procedure.  We will call around 7:15- 8:00 am to check on you and address any questions or concerns that you may have regarding the information given to you following your procedure. If we do not reach you, we will leave a message.     If any biopsies were taken you will be contacted by phone or by letter within the next 1-3 weeks.  Please call us at [phone] if you have not heard about the biopsies in 3 weeks.    SIGNATURES/CONFIDENTIALITY: You and/or your care partner have signed paperwork which will be entered into your electronic medical record.  These signatures attest to the fact that that the information above on your After Visit Summary has been reviewed and is understood.  Full responsibility of the confidentiality of this discharge information lies with you and/or your care-partner.

## 2024-01-24 NOTE — Op Note (Signed)
 [facility] Patient Name: [name] Procedure Date: 01/24/2024 3:40 PM MRN: [id number] Endoscopist: Sandor Flatter , MD, [id number] Age: 68 Referring MD:  Date of Birth: 10-09-1955 Gender: Male Account #: [id number] Procedure:                Colonoscopy Indications:              Screening for colorectal malignant neoplasm (last                            colonoscopy was more than 10 years ago)                           Last colonoscopy was 02/2004 and notable for                            left-sided diverticulosis with probable resolving                            diverticulitis, external hemorrhoids, otherwise                            normal.                           Otherwise, no GI symptoms and no known family                            history of colon cancer. Medicines:                Monitored Anesthesia Care Procedure:                Pre-Anesthesia Assessment:                           - Prior to the procedure, a History and Physical                            was performed, and patient medications and                            allergies were reviewed. The patient's tolerance of                            previous anesthesia was also reviewed. The risks                            and benefits of the procedure and the sedation                            options and risks were discussed with the patient.                            All questions were answered, and informed consent                            was obtained.  Prior Anticoagulants: The patient has                            taken no anticoagulant or antiplatelet agents. ASA                            Grade Assessment: II - A patient with mild systemic                            disease. After reviewing the risks and benefits,                            the patient was deemed in satisfactory condition to                            undergo the procedure.                           After obtaining  informed consent, the colonoscope                            was passed under direct vision. Throughout the                            procedure, the patient's blood pressure, pulse, and                            oxygen saturations were monitored continuously. The                            Olympus Scope SN [id number] was introduced through the                            anus and advanced to the the cecum, identified by                            appendiceal orifice and ileocecal valve. The                            colonoscopy was performed without difficulty. The                            patient tolerated the procedure well. The quality                            of the bowel preparation was good. The ileocecal                            valve, appendiceal orifice, and rectum were                            photographed. Scope In: 3:56:07 PM Scope Out: 4:08:18 PM Scope Withdrawal Time: 0 hours 9 minutes 29 seconds  Total Procedure Duration: 0 hours 12 minutes  11 seconds  Findings:                 The perianal and digital rectal examinations were                            normal.                           Multiple medium-mouthed and small-mouthed                            diverticula were found in the sigmoid colon and                            descending colon.                           The remainder of the colon was otherwise normal                            throughout.                           Non-bleeding internal hemorrhoids were found during                            retroflexion. The hemorrhoids were small. Complications:            No immediate complications. Estimated Blood Loss:     Estimated blood loss: none. Impression:               - Diverticulosis in the sigmoid colon and in the                            descending colon.                           - The remainder of the colon is otherwise normal.                           - Non-bleeding internal hemorrhoids.                            - No specimens collected. Recommendation:           - Patient has a contact number available for                            emergencies. The signs and symptoms of potential                            delayed complications were discussed with the                            patient. Return to normal activities tomorrow.                            Written discharge instructions were  provided to the                            patient.                           - Resume previous diet.                           - Continue present medications.                           - Repeat colonoscopy in 10 years for screening                            purposes.                           - Return to GI office PRN. Sandor Flatter, MD 01/24/2024 4:15:06 PM

## 2024-01-24 NOTE — Progress Notes (Signed)
 Pt's states no medical or surgical changes since previsit or office visit.

## 2024-01-24 NOTE — Progress Notes (Signed)
 GASTROENTEROLOGY PROCEDURE H&P NOTE   Primary Care Physician: Valma Carwin, MD    Reason for Procedure:  Colon Cancer screening  Plan:    Colonoscopy  Patient is appropriate for endoscopic procedure(s) in the ambulatory ([facility]) setting.  The nature of the procedure, as well as the risks, benefits, and alternatives were carefully and thoroughly reviewed with the patient. Ample time for discussion and questions allowed. The patient understood, was satisfied, and agreed to proceed.     HPI: [name] is a 68 y.o. male who presents for colonoscopy for routine Colon Cancer screening.  No active GI symptoms.  No known family history of colon cancer or related malignancy.  Patient is otherwise without complaints or active issues today.  Last colonoscopy was 02/2004 and notable for left-sided diverticulosis with probable resolving diverticulitis, external hemorrhoids, otherwise normal.  Past Medical History:  Diagnosis Date   ADD (attention deficit disorder)    ARTHRITIS    Arthritis    Back pain    Chest pain    CHF (congestive heart failure) (HCC)    CHONDROMALACIA PATELLA, LEFT    Depression    Edema of both lower extremities    EXOGENOUS OBESITY    GOUT    HIP REPLACEMENT, BILATERAL, HX OF    Hyperlipidemia    Hypertension    ICD BiV ICD [employer] GALLANT 05/26/2022 09/05/2022   Joint pain    Obstructive apnea    patient could not tolerate CPAP due to nasal airflow restriction.    Palpitations    Sleep apnea    SOB (shortness of breath)     Past Surgical History:  Procedure Laterality Date   BIV ICD INSERTION CRT-D N/A 05/26/2022   Procedure: BIV ICD INSERTION CRT-D;  Surgeon: Nancey Eulas BRAVO, MD;  Location: [facility] INVASIVE CV LAB;  Service: Cardiovascular;  Laterality: N/A;   COLONOSCOPY     JOINT REPLACEMENT     LEAD REVISION/REPAIR N/A 06/01/2022   Procedure: LEAD REVISION/REPAIR;  Surgeon: Cindie Ole DASEN, MD;  Location: [facility] INVASIVE CV LAB;  Service:  Cardiovascular;  Laterality: N/A;   NASAL SINUS SURGERY     Dr . floy , October 2014 , nasal septum    RIGHT/LEFT HEART CATH AND CORONARY ANGIOGRAPHY N/A 05/05/2022   Procedure: RIGHT/LEFT HEART CATH AND CORONARY ANGIOGRAPHY;  Surgeon: Ladona Heinz, MD;  Location: [facility] INVASIVE CV LAB;  Service: Cardiovascular;  Laterality: N/A;   TOTAL HIP REVISION Right 03/03/2015   Procedure: RIGHT HIP BEARING SURFACE REVISION;  Surgeon: Dempsey Moan, MD;  Location: [facility] ORS;  Service: Orthopedics;  Laterality: Right;    Prior to Admission medications   Medication Sig Start Date End Date Taking? Authorizing Provider  allopurinol (ZYLOPRIM) 100 MG tablet Take 100 mg by mouth daily.   Yes [provider]  carvedilol  (COREG ) 25 MG tablet Take 1 tablet (25 mg total) by mouth 2 (two) times daily. 06/07/23  Yes Ladona Heinz, MD  Dextromethorphan-buPROPion  ER (AUVELITY ) 45-105 MG TBCR Take one tablet by mouth twice daily 8 hours between doses 01/24/24  Yes White, Brian A, NP  ENTRESTO  97-103 MG Take 1 tablet by mouth 2 (two) times daily. 05/29/23  Yes Ladona Heinz, MD  Multiple Vitamins-Minerals (MULTIVITAMIN WITH MINERALS) tablet Take 1 tablet by mouth daily.   Yes [provider]  rosuvastatin  (CRESTOR ) 20 MG tablet Take 1 tablet (20 mg total) by mouth daily. 06/07/23  Yes Ladona Heinz, MD  spironolactone  (ALDACTONE ) 25 MG tablet Take 1 tablet (25 mg  total) by mouth daily. 06/07/23  Yes Ladona Heinz, MD  tadalafil  (CIALIS ) 20 MG tablet Take 1/2 to one whole tablet by mouth daily as needed 01/24/24  Yes White, Redell A, NP  tadalafil  (CIALIS ) 5 MG tablet Take 5 mg by mouth at bedtime. 02/24/20  Yes [provider]  topiramate  (TOPAMAX ) 25 MG tablet Take 1 tablet (25 mg total) by mouth daily. 11/21/22  Yes Francyne Romano, MD  LORazepam  (ATIVAN ) 1 MG tablet Take 1 tablet (1 mg total) by mouth 2 (two) times daily. 01/24/24   Teresa Redell LABOR, NP  methocarbamol  (ROBAXIN ) 500 MG tablet Take 500 mg by mouth  every 8 (eight) hours as needed for muscle spasms. 09/15/22   [provider]  Multiple Vitamins-Minerals (ZINC PO) Take 1 tablet by mouth daily. Patient not taking: Reported on 01/10/2024    [provider]  naltrexone  (DEPADE) 50 MG tablet Take 1 tablet (50 mg total) by mouth daily. Start 0.5 (25 mg) tab daily for 1 week. Patient not taking: Reported on 01/10/2024 06/07/23   Ladona Heinz, MD  QUEtiapine  (SEROQUEL ) 50 MG tablet Take 1 tablet (50 mg total) by mouth at bedtime. Patient not taking: Reported on 01/24/2024 01/24/24   Teresa Redell LABOR, NP  sertraline  (ZOLOFT ) 100 MG tablet Take 1 tablet (100 mg total) by mouth daily. Patient not taking: Reported on 01/24/2024 12/26/23   Teresa Redell LABOR, NP  tamsulosin  (FLOMAX ) 0.4 MG CAPS capsule Take 0.4 mg by mouth at bedtime. Patient not taking: Reported on 01/24/2024    [provider]  testosterone cypionate (DEPOTESTOSTERONE CYPIONATE) 200 MG/ML injection Inject 200 mg into the muscle once a week. 04/19/21   [provider]  tirzepatide  (ZEPBOUND ) 10 MG/0.5ML injection vial INJECT 0.5 ML (10 MG) UNDER THE SKIN ONCE WEEKLY (0.5ML= 50 UNITS) 11/28/23   Ladona Heinz, MD  Vilazodone  HCl 20 MG TABS Take one tablet by mouth daily. Must take with food. Patient not taking: Reported on 01/24/2024 01/24/24   Teresa Redell A, NP  zolpidem (AMBIEN) 10 MG tablet Take 10 mg by mouth at bedtime. Patient not taking: No sig reported 09/20/23   [provider]    Current Outpatient Medications  Medication Sig Dispense Refill   allopurinol (ZYLOPRIM) 100 MG tablet Take 100 mg by mouth daily.     carvedilol  (COREG ) 25 MG tablet Take 1 tablet (25 mg total) by mouth 2 (two) times daily. 180 tablet 3   Dextromethorphan-buPROPion  ER (AUVELITY ) 45-105 MG TBCR Take one tablet by mouth twice daily 8 hours between doses 60 tablet 3   ENTRESTO  97-103 MG Take 1 tablet by mouth 2 (two) times daily. 180 tablet 3   Multiple Vitamins-Minerals  (MULTIVITAMIN WITH MINERALS) tablet Take 1 tablet by mouth daily.     rosuvastatin  (CRESTOR ) 20 MG tablet Take 1 tablet (20 mg total) by mouth daily. 90 tablet 3   spironolactone  (ALDACTONE ) 25 MG tablet Take 1 tablet (25 mg total) by mouth daily. 90 tablet 3   tadalafil  (CIALIS ) 20 MG tablet Take 1/2 to one whole tablet by mouth daily as needed 30 tablet 2   tadalafil  (CIALIS ) 5 MG tablet Take 5 mg by mouth at bedtime.     topiramate  (TOPAMAX ) 25 MG tablet Take 1 tablet (25 mg total) by mouth daily. 30 tablet 0   LORazepam  (ATIVAN ) 1 MG tablet Take 1 tablet (1 mg total) by mouth 2 (two) times daily. 60 tablet 3   methocarbamol  (ROBAXIN ) 500 MG tablet Take 500  mg by mouth every 8 (eight) hours as needed for muscle spasms.     Multiple Vitamins-Minerals (ZINC PO) Take 1 tablet by mouth daily. (Patient not taking: Reported on 01/10/2024)     naltrexone  (DEPADE) 50 MG tablet Take 1 tablet (50 mg total) by mouth daily. Start 0.5 (25 mg) tab daily for 1 week. (Patient not taking: Reported on 01/10/2024) 30 tablet 1   QUEtiapine  (SEROQUEL ) 50 MG tablet Take 1 tablet (50 mg total) by mouth at bedtime. (Patient not taking: Reported on 01/24/2024) 90 tablet 1   sertraline  (ZOLOFT ) 100 MG tablet Take 1 tablet (100 mg total) by mouth daily. (Patient not taking: Reported on 01/24/2024) 30 tablet 1   tamsulosin  (FLOMAX ) 0.4 MG CAPS capsule Take 0.4 mg by mouth at bedtime. (Patient not taking: Reported on 01/24/2024)     testosterone cypionate (DEPOTESTOSTERONE CYPIONATE) 200 MG/ML injection Inject 200 mg into the muscle once a week.     tirzepatide  (ZEPBOUND ) 10 MG/0.5ML injection vial INJECT 0.5 ML (10 MG) UNDER THE SKIN ONCE WEEKLY (0.5ML= 50 UNITS) 2 mL 11   Vilazodone  HCl 20 MG TABS Take one tablet by mouth daily. Must take with food. (Patient not taking: Reported on 01/24/2024) 90 tablet 1   zolpidem (AMBIEN) 10 MG tablet Take 10 mg by mouth at bedtime. (Patient not taking: No sig reported)     Current  Facility-Administered Medications  Medication Dose Route Frequency Provider Last Rate Last Admin   0.9 %  sodium chloride  infusion  500 mL Intravenous Continuous Kenny Rea V, DO        Allergies as of 01/24/2024   (No Known Allergies)    Family History  Problem Relation Age of Onset   Diabetes Father    High blood pressure Father    High Cholesterol Father    Heart disease Father    Kidney disease Father    Obesity Father    Heart disease Brother    Hypertension Brother    Hyperlipidemia Brother    Ataxia Neg Hx    Chorea Neg Hx    Dementia Neg Hx    Mental retardation Neg Hx    Migraines Neg Hx    Multiple sclerosis Neg Hx    Neurofibromatosis Neg Hx    Neuropathy Neg Hx    Parkinsonism Neg Hx    Seizures Neg Hx    Stroke Neg Hx    Colon cancer Neg Hx    Colon polyps Neg Hx    Esophageal cancer Neg Hx    Rectal cancer Neg Hx    Stomach cancer Neg Hx     Social History   Socioeconomic History   Marital status: Married    Spouse name: Married   Number of children: 0   Years of education: 16   Highest education level: Bachelor's degree (e.g., BA, AB, BS)  Occupational History   Occupation: Sports administrator  Tobacco Use   Smoking status: Never   Smokeless tobacco: Never  Vaping Use   Vaping status: Never Used  Substance and Sexual Activity   Alcohol use: No   Drug use: No   Sexual activity: Not Currently  Other Topics Concern   Not on file  Social History Narrative   Patient is married and lives alone.   Patient is working full-time.   Patient has a college education.   Patient is right-handed.   Patient drinks 5 cups of tea and sodas daily.   Social Drivers of Health  Financial Resource Strain: Not on file  Food Insecurity: No Food Insecurity (06/02/2022)   Hunger Vital Sign    Worried About Running Out of Food in the Last Year: Never true    Ran Out of Food in the Last Year: Never true  Transportation Needs: No Transportation Needs  (06/02/2022)   PRAPARE - Administrator, Civil Service (Medical): No    Lack of Transportation (Non-Medical): No  Physical Activity: Not on file  Stress: Not on file  Social Connections: Unknown (03/27/2022)   Received from [facility]   Social Network    Social Network: Not on file  Intimate Partner Violence: Not At Risk (06/02/2022)   Humiliation, Afraid, Rape, and Kick questionnaire    Fear of Current or Ex-Partner: No    Emotionally Abused: No    Physically Abused: No    Sexually Abused: No    Physical Exam: Vital signs in last 24 hours: @BP  126/75   Pulse 72   Temp 97.6 F (36.4 C)   Ht 5' 6 (1.676 m)   Wt 210 lb (95.3 kg)   SpO2 97%   BMI 33.89 kg/m  GEN: NAD EYE: Sclerae anicteric ENT: MMM CV: Non-tachycardic Pulm: CTA b/l GI: Soft, NT/ND NEURO:  Alert & Oriented x 3   Sandor Flatter, DO [facility]   01/24/2024 3:49 PM

## 2024-01-25 ENCOUNTER — Telehealth: Payer: Self-pay

## 2024-01-25 NOTE — Telephone Encounter (Signed)
  Follow up Call-     01/24/2024    2:41 PM  Call back number  Post procedure Call Back phone  # [phone]  Permission to leave phone message Yes     Patient questions:  Do you have a fever, pain , or abdominal swelling? No. Pain Score  0 *  Have you tolerated food without any problems? Yes.    Have you been able to return to your normal activities? Yes.    Do you have any questions about your discharge instructions: Diet   No. Medications  No. Follow up visit  No.  Do you have questions or concerns about your Care? No.  Actions: * If pain score is 4 or above: No action needed, pain <4.

## 2024-01-28 NOTE — Progress Notes (Signed)
 Remote ICD transmission.

## 2024-02-16 ENCOUNTER — Other Ambulatory Visit: Payer: Self-pay | Admitting: Behavioral Health

## 2024-02-16 DIAGNOSIS — F4323 Adjustment disorder with mixed anxiety and depressed mood: Secondary | ICD-10-CM

## 2024-02-16 DIAGNOSIS — F331 Major depressive disorder, recurrent, moderate: Secondary | ICD-10-CM

## 2024-02-16 DIAGNOSIS — F411 Generalized anxiety disorder: Secondary | ICD-10-CM

## 2024-02-21 ENCOUNTER — Ambulatory Visit (INDEPENDENT_AMBULATORY_CARE_PROVIDER_SITE_OTHER)

## 2024-02-21 DIAGNOSIS — R55 Syncope and collapse: Secondary | ICD-10-CM | POA: Diagnosis not present

## 2024-02-21 LAB — CUP PACEART REMOTE DEVICE CHECK
Battery Remaining Longevity: 74 mo
Battery Remaining Percentage: 77 %
Battery Voltage: 2.99 V
Brady Statistic AP VP Percent: 2.6 %
Brady Statistic AP VS Percent: 1 %
Brady Statistic AS VP Percent: 97 %
Brady Statistic AS VS Percent: 1 %
Brady Statistic RA Percent Paced: 2.5 %
Date Time Interrogation Session: 20250918020459
HighPow Impedance: 62 Ohm
Implantable Lead Connection Status: 753985
Implantable Lead Connection Status: 753985
Implantable Lead Connection Status: 753985
Implantable Lead Implant Date: 20231222
Implantable Lead Implant Date: 20231222
Implantable Lead Implant Date: 20231222
Implantable Lead Location: 753858
Implantable Lead Location: 753859
Implantable Lead Location: 753860
Implantable Pulse Generator Implant Date: 20231222
Lead Channel Impedance Value: 410 Ohm
Lead Channel Impedance Value: 440 Ohm
Lead Channel Impedance Value: 750 Ohm
Lead Channel Pacing Threshold Amplitude: 0.75 V
Lead Channel Pacing Threshold Amplitude: 0.75 V
Lead Channel Pacing Threshold Amplitude: 1.25 V
Lead Channel Pacing Threshold Pulse Width: 0.5 ms
Lead Channel Pacing Threshold Pulse Width: 0.5 ms
Lead Channel Pacing Threshold Pulse Width: 0.5 ms
Lead Channel Sensing Intrinsic Amplitude: 12 mV
Lead Channel Sensing Intrinsic Amplitude: 2.7 mV
Lead Channel Setting Pacing Amplitude: 1.75 V
Lead Channel Setting Pacing Amplitude: 1.75 V
Lead Channel Setting Pacing Amplitude: 2 V
Lead Channel Setting Pacing Pulse Width: 0.5 ms
Lead Channel Setting Pacing Pulse Width: 0.5 ms
Lead Channel Setting Sensing Sensitivity: 0.5 mV
Pulse Gen Serial Number: 211011714
Zone Setting Status: 755011

## 2024-02-22 ENCOUNTER — Telehealth: Payer: Self-pay | Admitting: Pharmacy Technician

## 2024-02-22 NOTE — Telephone Encounter (Signed)
 Hi,   We received a fax about a duplicate therapy to cardiology. This is scanned in media. I am sending to you since it appears you prescribed these. Thank you

## 2024-02-23 ENCOUNTER — Ambulatory Visit: Payer: Self-pay | Admitting: Cardiovascular Disease

## 2024-02-25 NOTE — Telephone Encounter (Signed)
 Could you please call him and determine what he is taking. I assumed he was still taking the Viibryd  unless he switched himself.

## 2024-02-25 NOTE — Telephone Encounter (Signed)
 Called patient and he said he was taking Zoloft  and not Viibryd . 8/21 appt on med review says not taking Viibryd , but note says to continue. Patient said he was in the car and couldn't 100% verify what he was taking. Will call again.

## 2024-02-26 NOTE — Progress Notes (Signed)
Remote ICD Transmission.

## 2024-03-04 ENCOUNTER — Other Ambulatory Visit: Payer: Self-pay | Admitting: Cardiology

## 2024-03-04 ENCOUNTER — Telehealth: Payer: Self-pay | Admitting: Behavioral Health

## 2024-03-04 ENCOUNTER — Other Ambulatory Visit: Payer: Self-pay | Admitting: Behavioral Health

## 2024-03-04 DIAGNOSIS — F331 Major depressive disorder, recurrent, moderate: Secondary | ICD-10-CM

## 2024-03-04 DIAGNOSIS — F411 Generalized anxiety disorder: Secondary | ICD-10-CM

## 2024-03-04 DIAGNOSIS — F4323 Adjustment disorder with mixed anxiety and depressed mood: Secondary | ICD-10-CM

## 2024-03-04 NOTE — Telephone Encounter (Signed)
 Pt picked up a bottle of samples today.

## 2024-03-04 NOTE — Telephone Encounter (Signed)
 Pt states his pharm is out of auvelity  until next week and wants to come get some samples today.

## 2024-03-05 NOTE — Telephone Encounter (Signed)
 Need to confirm medications patient is taking. He was at work. I asked him to call back tomorrow.

## 2024-04-25 ENCOUNTER — Ambulatory Visit: Admitting: Behavioral Health

## 2024-05-05 ENCOUNTER — Encounter: Payer: Self-pay | Admitting: Behavioral Health

## 2024-05-05 ENCOUNTER — Ambulatory Visit: Admitting: Behavioral Health

## 2024-05-05 DIAGNOSIS — F331 Major depressive disorder, recurrent, moderate: Secondary | ICD-10-CM | POA: Diagnosis not present

## 2024-05-05 DIAGNOSIS — F4323 Adjustment disorder with mixed anxiety and depressed mood: Secondary | ICD-10-CM | POA: Diagnosis not present

## 2024-05-05 DIAGNOSIS — Z630 Problems in relationship with spouse or partner: Secondary | ICD-10-CM

## 2024-05-05 DIAGNOSIS — F411 Generalized anxiety disorder: Secondary | ICD-10-CM

## 2024-05-05 DIAGNOSIS — F5105 Insomnia due to other mental disorder: Secondary | ICD-10-CM

## 2024-05-05 DIAGNOSIS — F99 Mental disorder, not otherwise specified: Secondary | ICD-10-CM

## 2024-05-05 DIAGNOSIS — N522 Drug-induced erectile dysfunction: Secondary | ICD-10-CM

## 2024-05-05 MED ORDER — VILAZODONE HCL 20 MG PO TABS: ORAL_TABLET | ORAL | 1 refills | Status: AC

## 2024-05-05 MED ORDER — LORAZEPAM 1 MG PO TABS: 1.0000 mg | ORAL_TABLET | Freq: Two times a day (BID) | ORAL | 3 refills | Status: AC

## 2024-05-05 MED ORDER — QUETIAPINE FUMARATE 50 MG PO TABS: 50.0000 mg | ORAL_TABLET | Freq: Every day | ORAL | 1 refills | Status: AC

## 2024-05-05 MED ORDER — AUVELITY 45-105 MG PO TBCR: EXTENDED_RELEASE_TABLET | ORAL | 5 refills | Status: AC

## 2024-05-05 MED ORDER — TADALAFIL 20 MG PO TABS: ORAL_TABLET | ORAL | 3 refills | Status: AC

## 2024-05-05 NOTE — Progress Notes (Signed)
 [facility]  Patient ID: [name],  MRN: [id number]  PCP: Valma Carwin, MD  Date of Evaluation: 05/05/2024 Time spent:30 minutes  Chief Complaint:  Chief Complaint   Depression; Anxiety; Follow-up; Medication Refill; Patient Education; Stress     HISTORY/CURRENT STATUS: HPI [name], 68 year old male presents to this office for follow up and medication management. Collateral information should be considered reliable. Patient is much more calm and collected today. He is smiling and in good spirits. Less psychomotor agitation. Says that he is doing much better. Still dealing with relationship issues with spouse.  He reports that his anxiety today is 2/10 and depression is 3/10. Sleep has improved with Seroquel . No waking up much during night. Requesting medication to help with sleep. Appetite ok.  He denies hx of mania, no psychosis, no auditory or visual hallucinations.  Denies SI or HI    Past Psychiatric Medication Trials: Adderall XR and IR Wellbutrin  Ativan  Naltrexone  Zoloft        Individual Medical History/ Review of Systems: Changes? :No   Allergies: Patient has no known allergies.  Current Medications:  Current Outpatient Medications:    allopurinol (ZYLOPRIM) 100 MG tablet, Take 100 mg by mouth daily., Disp: , Rfl:    carvedilol  (COREG ) 25 MG tablet, Take 1 tablet (25 mg total) by mouth 2 (two) times daily., Disp: 180 tablet, Rfl: 3   Dextromethorphan-buPROPion  ER (AUVELITY ) 45-105 MG TBCR, Take one tablet by mouth twice daily 8 hours between doses, Disp: 60 tablet, Rfl: 5   ENTRESTO  97-103 MG, TAKE 1 TABLET BY MOUTH TWICE A DAY, Disp: 180 tablet, Rfl: 1   LORazepam  (ATIVAN ) 1 MG tablet, Take 1 tablet (1 mg total) by mouth 2 (two) times daily., Disp: 60 tablet, Rfl: 3   methocarbamol  (ROBAXIN ) 500 MG tablet, Take 500 mg by mouth every 8 (eight) hours as needed for muscle spasms., Disp: , Rfl:    Multiple Vitamins-Minerals (MULTIVITAMIN WITH  MINERALS) tablet, Take 1 tablet by mouth daily., Disp: , Rfl:    Multiple Vitamins-Minerals (ZINC PO), Take 1 tablet by mouth daily. (Patient not taking: Reported on 01/10/2024), Disp: , Rfl:    naltrexone  (DEPADE) 50 MG tablet, Take 1 tablet (50 mg total) by mouth daily. Start 0.5 (25 mg) tab daily for 1 week. (Patient not taking: Reported on 01/10/2024), Disp: 30 tablet, Rfl: 1   QUEtiapine  (SEROQUEL ) 50 MG tablet, Take 1 tablet (50 mg total) by mouth at bedtime., Disp: 90 tablet, Rfl: 1   rosuvastatin  (CRESTOR ) 20 MG tablet, Take 1 tablet (20 mg total) by mouth daily., Disp: 90 tablet, Rfl: 3   sertraline  (ZOLOFT ) 100 MG tablet, TAKE 1 TABLET BY MOUTH EVERY DAY, Disp: 90 tablet, Rfl: 0   spironolactone  (ALDACTONE ) 25 MG tablet, Take 1 tablet (25 mg total) by mouth daily., Disp: 90 tablet, Rfl: 3   tadalafil  (CIALIS ) 20 MG tablet, Take 1/2 to one whole tablet by mouth daily as needed, Disp: 30 tablet, Rfl: 3   tadalafil  (CIALIS ) 5 MG tablet, Take 5 mg by mouth at bedtime., Disp: , Rfl:    tamsulosin  (FLOMAX ) 0.4 MG CAPS capsule, Take 0.4 mg by mouth at bedtime. (Patient not taking: Reported on 01/24/2024), Disp: , Rfl:    testosterone cypionate (DEPOTESTOSTERONE CYPIONATE) 200 MG/ML injection, Inject 200 mg into the muscle once a week., Disp: , Rfl:    tirzepatide  (ZEPBOUND ) 10 MG/0.5ML injection vial, INJECT 0.5 ML (10 MG) UNDER THE SKIN ONCE WEEKLY (0.5ML= 50 UNITS), Disp: 2 mL, Rfl: 11  topiramate  (TOPAMAX ) 25 MG tablet, Take 1 tablet (25 mg total) by mouth daily., Disp: 30 tablet, Rfl: 0   Vilazodone  HCl 20 MG TABS, Take one tablet by mouth daily. Must take with food., Disp: 90 tablet, Rfl: 1 Medication Side Effects: none  Family Medical/ Social History: Changes? No  MENTAL HEALTH EXAM:  There were no vitals taken for this visit.There is no height or weight on file to calculate BMI.  General Appearance: Casual, Neat, and Well Groomed  Eye Contact:  Good  Speech:  Clear and Coherent  Volume:   Normal  Mood:  Anxious and Depressed  Affect:  Appropriate  Thought Process:  Coherent  Orientation:  Full (Time, Place, and Person)  Thought Content: Logical   Suicidal Thoughts:  No  Homicidal Thoughts:  No  Memory:  WNL  Judgement:  Fair  Insight:  Fair  Psychomotor Activity:  Normal  Concentration:  Concentration: Fair  Recall:  [employer] of Knowledge: Fair  Language: Fair  Assets:  Desire for Improvement  ADL's:  Intact  Cognition: WNL  Prognosis:  Good    DIAGNOSES:    ICD-10-CM   1. Generalized anxiety disorder  F41.1 LORazepam  (ATIVAN ) 1 MG tablet    Vilazodone  HCl 20 MG TABS    2. Stress due to marital problems  Z63.0 LORazepam  (ATIVAN ) 1 MG tablet    3. Major depressive disorder, recurrent episode, moderate (HCC)  F33.1 Vilazodone  HCl 20 MG TABS    Dextromethorphan-buPROPion  ER (AUVELITY ) 45-105 MG TBCR    4. 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 We discussed his report of moderate improvement since last visit. Says medications are starting to work well. He says he is experiencing more erectile dysfunction on his meds and is asking for higher dose of cialis . Had questions about how long he should be on this medication. I reinforced that medications take time to work and that often patients stop their medication to soon or when they start feeling better, then experience relapse. He expressed understanding and is committed to taking as prescribed.      We agreed today to:   Continue Auvelity  45/90 twice daily. 8  hours between dosing.  To continue Ativan  1 mg twice daily. Take one tablet in the am and one at supper time. To continue Viibryd  20 mg daily. Must take with food Continue Cialis  20 mg daily as needed before sexual activity.  Continue Seroquel  25-50 mg at bedtime for sleep.  To follow up in 6  months to reassess To report worsening symptoms or side effects promptly Discussed potential benefits, risk, and side effects of benzodiazepines to include potential risk of tolerance and dependence, as well as possible drowsiness.  Advised patient not to drive if experiencing drowsiness and to take lowest possible effective dose to minimize risk of dependence and tolerance.  Provided emergency contact information Discussed potential metabolic side effects associated with atypical antipsychotics, as well as potential risk for movement side effects. Advised pt to contact  office if movement side effects occur.     Reviewed PDMP   Redell DELENA Pizza, NP

## 2024-05-15 ENCOUNTER — Encounter: Payer: Self-pay | Admitting: Cardiology

## 2024-05-22 ENCOUNTER — Ambulatory Visit

## 2024-05-22 DIAGNOSIS — R55 Syncope and collapse: Secondary | ICD-10-CM | POA: Diagnosis not present

## 2024-05-22 LAB — CUP PACEART REMOTE DEVICE CHECK
Battery Remaining Longevity: 72 mo
Battery Remaining Percentage: 74 %
Battery Voltage: 2.99 V
Brady Statistic AP VP Percent: 2.4 %
Brady Statistic AP VS Percent: 1 %
Brady Statistic AS VP Percent: 98 %
Brady Statistic AS VS Percent: 1 %
Brady Statistic RA Percent Paced: 2.3 %
Date Time Interrogation Session: 20251218020046
HighPow Impedance: 65 Ohm
Implantable Lead Connection Status: 753985
Implantable Lead Connection Status: 753985
Implantable Lead Connection Status: 753985
Implantable Lead Implant Date: 20231222
Implantable Lead Implant Date: 20231222
Implantable Lead Implant Date: 20231222
Implantable Lead Location: 753858
Implantable Lead Location: 753859
Implantable Lead Location: 753860
Implantable Pulse Generator Implant Date: 20231222
Lead Channel Impedance Value: 410 Ohm
Lead Channel Impedance Value: 460 Ohm
Lead Channel Impedance Value: 790 Ohm
Lead Channel Pacing Threshold Amplitude: 0.75 V
Lead Channel Pacing Threshold Amplitude: 0.75 V
Lead Channel Pacing Threshold Amplitude: 1.125 V
Lead Channel Pacing Threshold Pulse Width: 0.5 ms
Lead Channel Pacing Threshold Pulse Width: 0.5 ms
Lead Channel Pacing Threshold Pulse Width: 0.5 ms
Lead Channel Sensing Intrinsic Amplitude: 1.5 mV
Lead Channel Sensing Intrinsic Amplitude: 12 mV
Lead Channel Setting Pacing Amplitude: 1.625
Lead Channel Setting Pacing Amplitude: 1.75 V
Lead Channel Setting Pacing Amplitude: 2 V
Lead Channel Setting Pacing Pulse Width: 0.5 ms
Lead Channel Setting Pacing Pulse Width: 0.5 ms
Lead Channel Setting Sensing Sensitivity: 0.5 mV
Pulse Gen Serial Number: 211011714
Zone Setting Status: 755011

## 2024-05-25 NOTE — Progress Notes (Signed)
 Remote ICD Transmission

## 2024-05-26 ENCOUNTER — Other Ambulatory Visit: Payer: Self-pay | Admitting: Cardiology

## 2024-05-26 DIAGNOSIS — I5022 Chronic systolic (congestive) heart failure: Secondary | ICD-10-CM

## 2024-05-26 DIAGNOSIS — I420 Dilated cardiomyopathy: Secondary | ICD-10-CM

## 2024-06-04 ENCOUNTER — Ambulatory Visit: Payer: Self-pay | Admitting: Cardiovascular Disease

## 2024-08-21 ENCOUNTER — Encounter

## 2024-11-03 ENCOUNTER — Ambulatory Visit: Admitting: Behavioral Health

## 2024-11-20 ENCOUNTER — Encounter
